# Patient Record
Sex: Female | Born: 1970 | Race: Black or African American | Hispanic: No | Marital: Single | Smoking: Former smoker
Health system: Southern US, Community
[De-identification: ages and names within clinical notes are randomized; demographics above are authoritative.]

## PROBLEM LIST (undated history)

## (undated) DIAGNOSIS — N12 Tubulo-interstitial nephritis, not specified as acute or chronic: Secondary | ICD-10-CM

## (undated) DIAGNOSIS — C801 Malignant (primary) neoplasm, unspecified: Secondary | ICD-10-CM

## (undated) DIAGNOSIS — D649 Anemia, unspecified: Secondary | ICD-10-CM

## (undated) DIAGNOSIS — J189 Pneumonia, unspecified organism: Secondary | ICD-10-CM

## (undated) DIAGNOSIS — I10 Essential (primary) hypertension: Secondary | ICD-10-CM

## (undated) DIAGNOSIS — J40 Bronchitis, not specified as acute or chronic: Secondary | ICD-10-CM

## (undated) DIAGNOSIS — R011 Cardiac murmur, unspecified: Secondary | ICD-10-CM

## (undated) DIAGNOSIS — J45909 Unspecified asthma, uncomplicated: Secondary | ICD-10-CM

## (undated) DIAGNOSIS — F32A Depression, unspecified: Secondary | ICD-10-CM

## (undated) DIAGNOSIS — Z5189 Encounter for other specified aftercare: Secondary | ICD-10-CM

## (undated) DIAGNOSIS — R87629 Unspecified abnormal cytological findings in specimens from vagina: Secondary | ICD-10-CM

## (undated) DIAGNOSIS — F419 Anxiety disorder, unspecified: Secondary | ICD-10-CM

## (undated) DIAGNOSIS — I219 Acute myocardial infarction, unspecified: Secondary | ICD-10-CM

## (undated) DIAGNOSIS — R0600 Dyspnea, unspecified: Secondary | ICD-10-CM

## (undated) DIAGNOSIS — Z9189 Other specified personal risk factors, not elsewhere classified: Secondary | ICD-10-CM

## (undated) DIAGNOSIS — T7840XA Allergy, unspecified, initial encounter: Secondary | ICD-10-CM

## (undated) DIAGNOSIS — D259 Leiomyoma of uterus, unspecified: Secondary | ICD-10-CM

## (undated) HISTORY — DX: Tubulo-interstitial nephritis, not specified as acute or chronic: N12

## (undated) HISTORY — PX: TUBAL LIGATION: SHX77

## (undated) HISTORY — DX: Dyspnea, unspecified: R06.00

## (undated) HISTORY — DX: Depression, unspecified: F32.A

## (undated) HISTORY — PX: TONSILLECTOMY: SUR1361

## (undated) HISTORY — DX: Pneumonia, unspecified organism: J18.9

## (undated) HISTORY — DX: Encounter for other specified aftercare: Z51.89

## (undated) HISTORY — DX: Anxiety disorder, unspecified: F41.9

## (undated) HISTORY — DX: Allergy, unspecified, initial encounter: T78.40XA

## (undated) HISTORY — PX: EYE SURGERY: SHX253

## (undated) HISTORY — DX: Unspecified abnormal cytological findings in specimens from vagina: R87.629

## (undated) HISTORY — DX: Unspecified asthma, uncomplicated: J45.909

## (undated) HISTORY — DX: Essential (primary) hypertension: I10

## (undated) HISTORY — DX: Cardiac murmur, unspecified: R01.1

## (undated) HISTORY — PX: ABDOMINAL HYSTERECTOMY: SHX81

---
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
## 2005-02-24 ENCOUNTER — Emergency Department (HOSPITAL_COMMUNITY): Admission: EM | Admit: 2005-02-24 | Discharge: 2005-02-25 | Payer: Self-pay | Admitting: Emergency Medicine

## 2005-04-20 ENCOUNTER — Emergency Department (HOSPITAL_COMMUNITY): Admission: EM | Admit: 2005-04-20 | Discharge: 2005-04-20 | Payer: Self-pay | Admitting: Emergency Medicine

## 2005-09-20 ENCOUNTER — Emergency Department (HOSPITAL_COMMUNITY): Admission: EM | Admit: 2005-09-20 | Discharge: 2005-09-20 | Payer: Self-pay | Admitting: Emergency Medicine

## 2005-09-27 ENCOUNTER — Emergency Department (HOSPITAL_COMMUNITY): Admission: EM | Admit: 2005-09-27 | Discharge: 2005-09-27 | Payer: Self-pay | Admitting: Emergency Medicine

## 2006-01-20 ENCOUNTER — Emergency Department (HOSPITAL_COMMUNITY): Admission: EM | Admit: 2006-01-20 | Discharge: 2006-01-21 | Payer: Self-pay | Admitting: Emergency Medicine

## 2006-07-14 ENCOUNTER — Emergency Department (HOSPITAL_COMMUNITY): Admission: EM | Admit: 2006-07-14 | Discharge: 2006-07-14 | Payer: Self-pay | Admitting: Emergency Medicine

## 2006-07-15 ENCOUNTER — Emergency Department (HOSPITAL_COMMUNITY): Admission: EM | Admit: 2006-07-15 | Discharge: 2006-07-15 | Payer: Self-pay | Admitting: Emergency Medicine

## 2006-07-17 ENCOUNTER — Inpatient Hospital Stay (HOSPITAL_COMMUNITY): Admission: EM | Admit: 2006-07-17 | Discharge: 2006-07-20 | Payer: Self-pay | Admitting: Emergency Medicine

## 2006-11-21 ENCOUNTER — Emergency Department (HOSPITAL_COMMUNITY): Admission: EM | Admit: 2006-11-21 | Discharge: 2006-11-22 | Payer: Self-pay | Admitting: Emergency Medicine

## 2006-12-24 ENCOUNTER — Emergency Department (HOSPITAL_COMMUNITY): Admission: EM | Admit: 2006-12-24 | Discharge: 2006-12-24 | Payer: Self-pay | Admitting: Emergency Medicine

## 2007-07-06 ENCOUNTER — Inpatient Hospital Stay (HOSPITAL_COMMUNITY): Admission: EM | Admit: 2007-07-06 | Discharge: 2007-07-09 | Payer: Self-pay | Admitting: Emergency Medicine

## 2007-07-06 ENCOUNTER — Ambulatory Visit: Payer: Self-pay | Admitting: Hematology & Oncology

## 2007-07-06 ENCOUNTER — Ambulatory Visit: Payer: Self-pay | Admitting: Cardiology

## 2007-07-18 ENCOUNTER — Emergency Department (HOSPITAL_COMMUNITY): Admission: EM | Admit: 2007-07-18 | Discharge: 2007-07-18 | Payer: Self-pay | Admitting: Emergency Medicine

## 2007-09-22 IMAGING — CR DG CHEST 2V
2 series · 2 of 2 positions shown · non-contrast
Comparison: None.

CLINICAL DATA: Productive cough for approximately 1 week.  
 CHEST - 2 VIEW ? 04/20/05:

[w chest pa (1 of 2)]
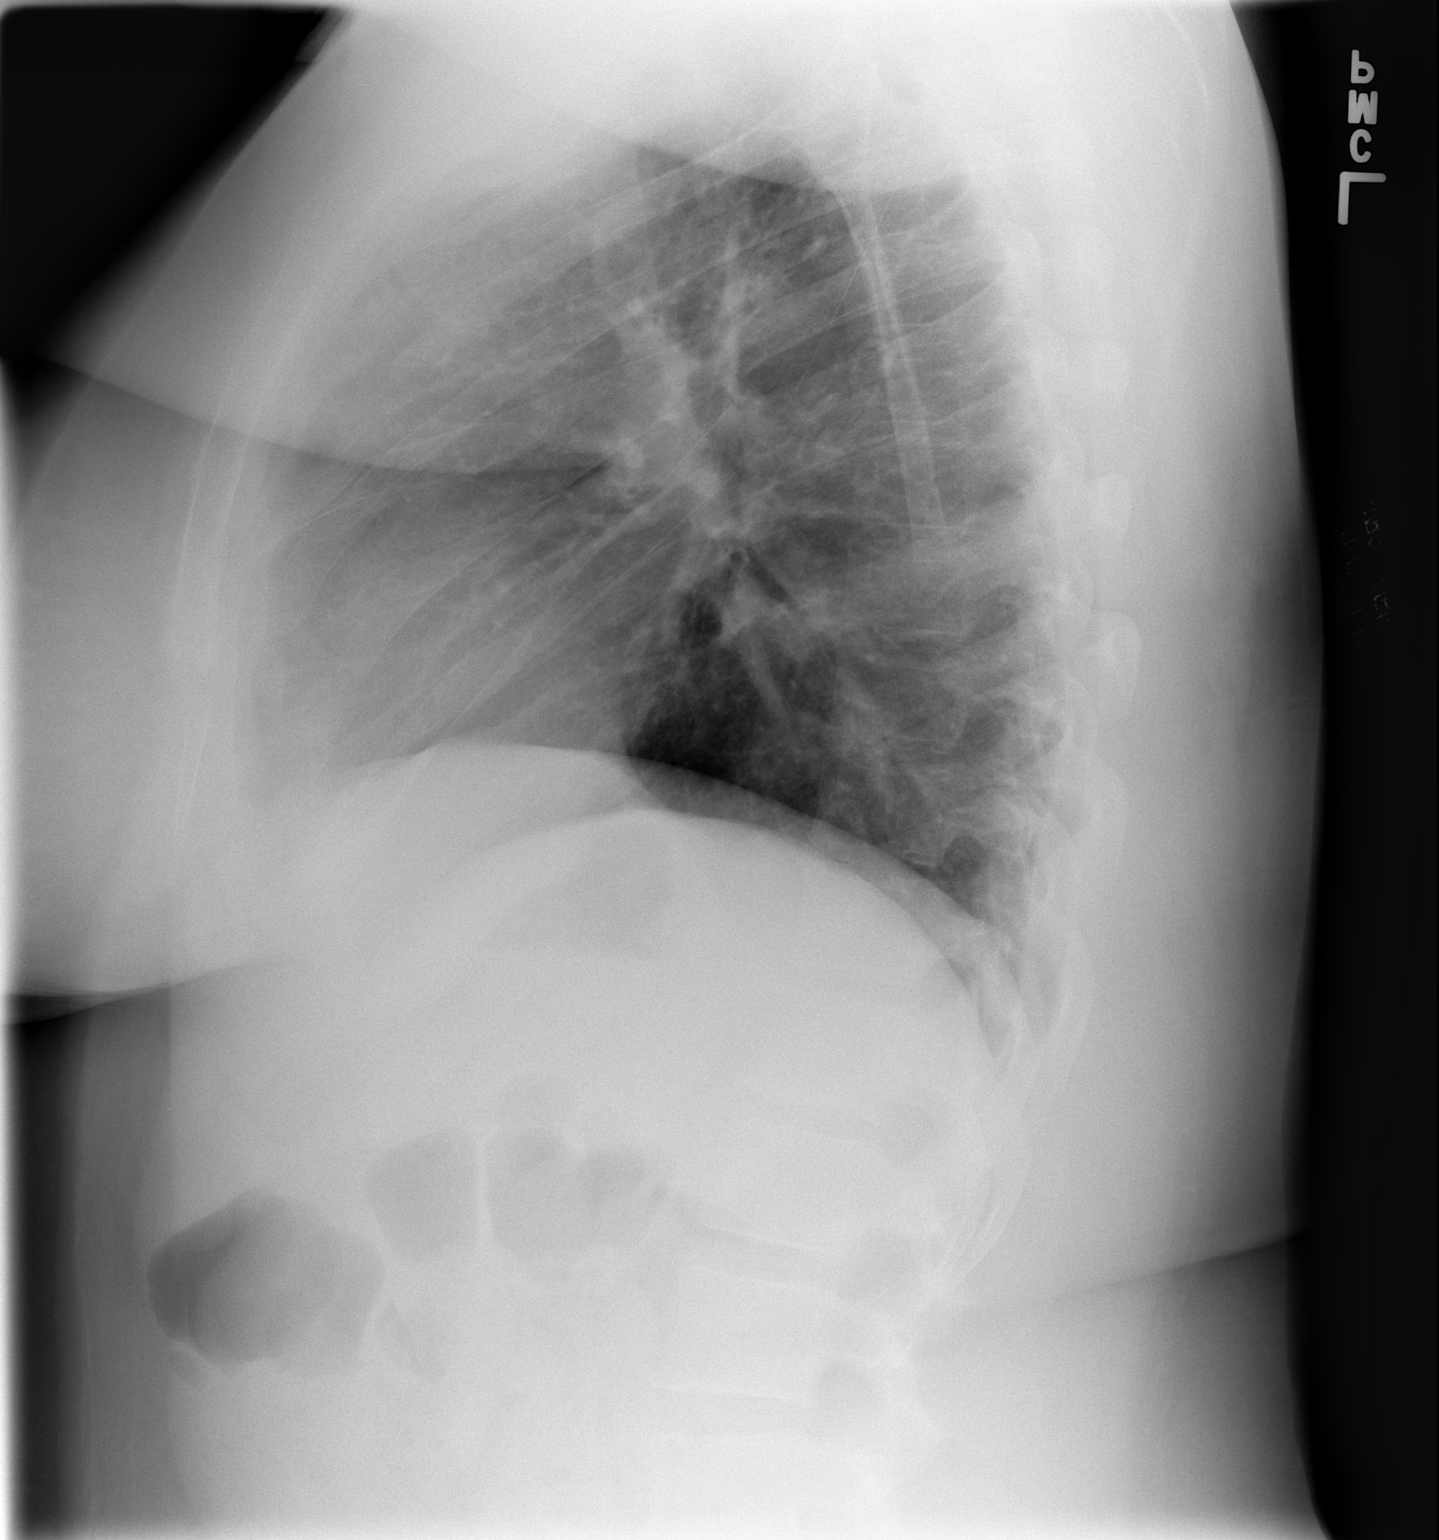

[w chest pa (2 of 2)]
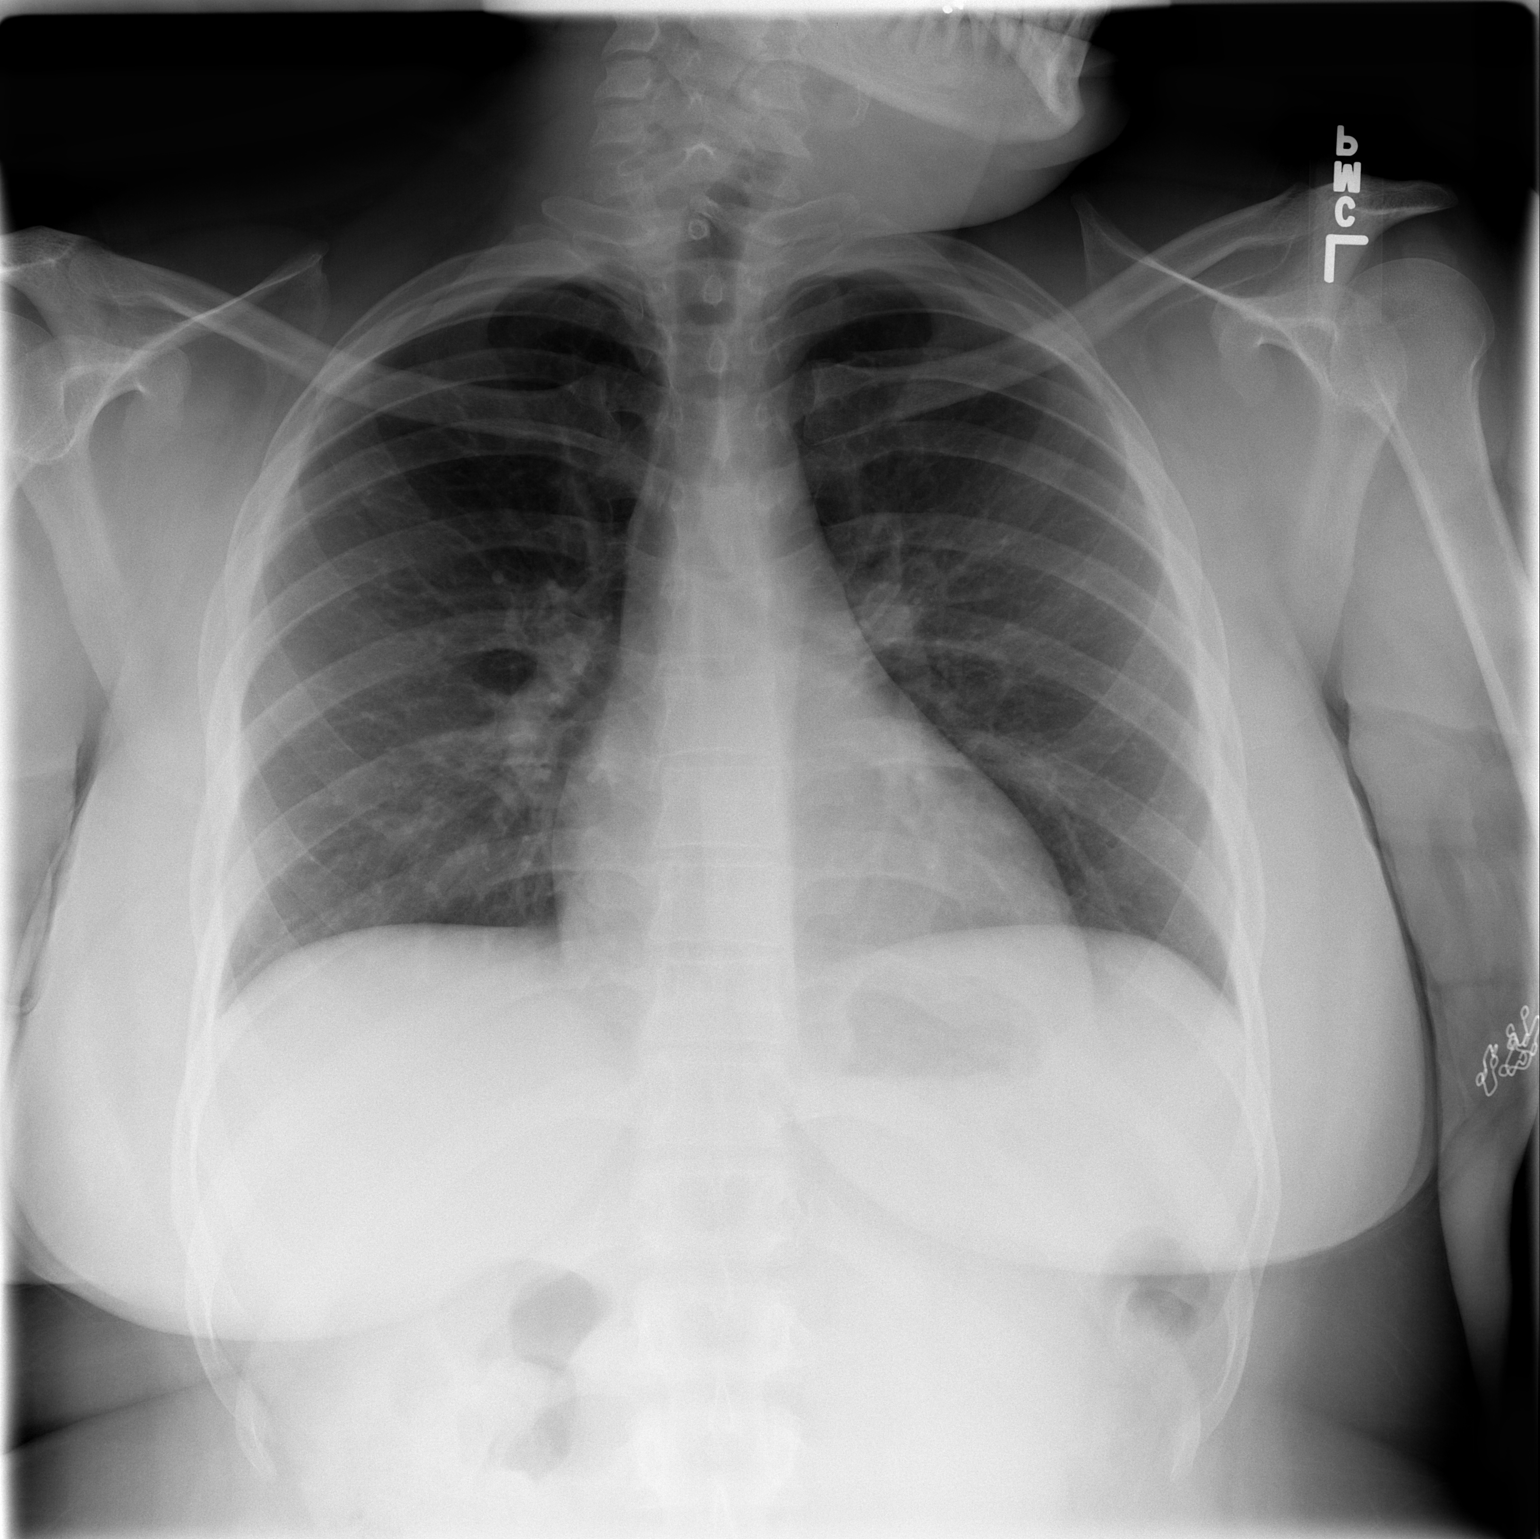

[2 of 2 positions shown; findings below may reference images not displayed]

FINDINGS: Lung volumes are low but the lungs are clear.  No pleural effusion.  The heart size is normal.  No focal bony abnormality.
IMPRESSION: No acute disease.

## 2008-02-01 ENCOUNTER — Emergency Department (HOSPITAL_COMMUNITY): Admission: EM | Admit: 2008-02-01 | Discharge: 2008-02-02 | Payer: Self-pay | Admitting: Emergency Medicine

## 2008-02-29 IMAGING — CT CT ABDOMEN W/ CM
1 of 5 series · 14 of 36 positions shown, 19 images · IV contrast (omnipaque)
Comparison: none

CLINICAL DATA: Lower abdominal pain. 
ABDOMEN CT WITH CONTRAST ? 09/27/05:
TECHNIQUE: Multidetector CT imaging of the abdomen was performed following the standard protocol during bolus administration of intravenous contrast. 
Contrast:  120 cc Omnipaque 300 IV.   Oral contrast was given.
TECHNIQUE: Multidetector CT imaging of the pelvis was performed following the standard protocol during bolus administration of intravenous contrast.

[Series 2: routine abdomen · axial · 0.82mm/px · z∈[-412,-67]mm · 14 of 79 slices shown, 19 images]
[im 5/79  soft-tissue]
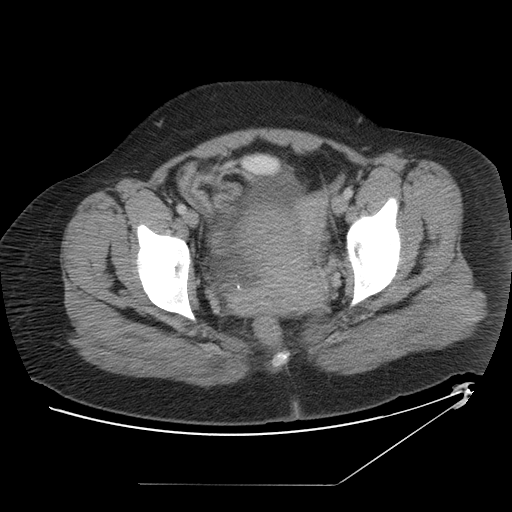
[im 5/79  bone]
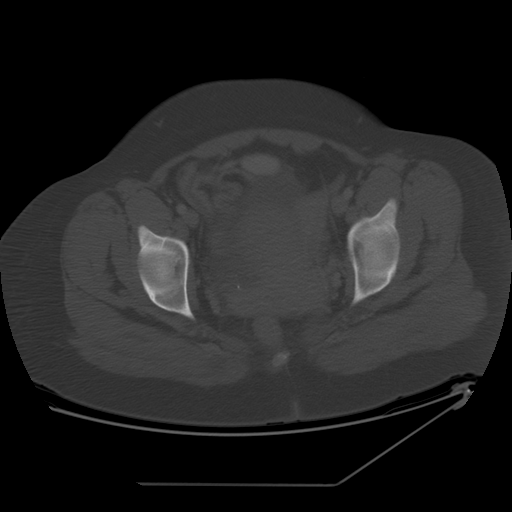
[im 13/79  soft-tissue]
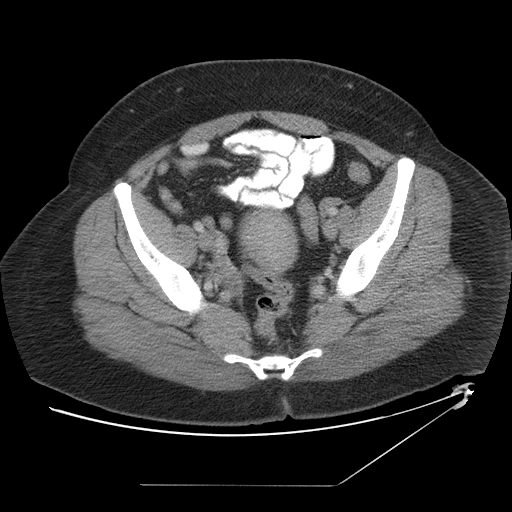
[im 17/79  soft-tissue]
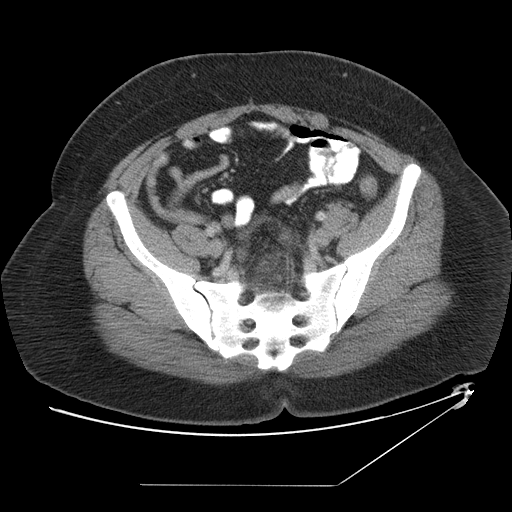
[im 21/79  soft-tissue]
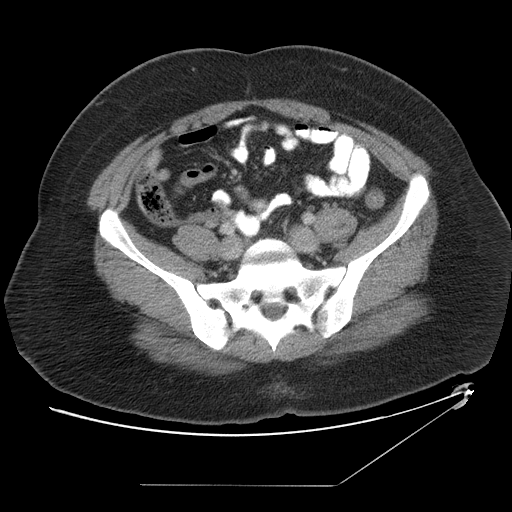
[im 29/79  soft-tissue]
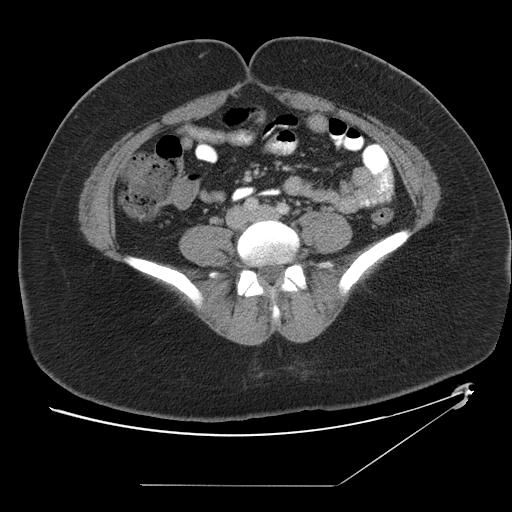
[im 33/79  soft-tissue]
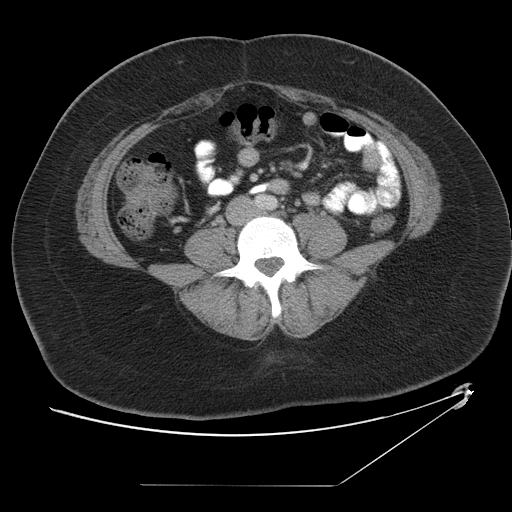
[im 42/79  soft-tissue]
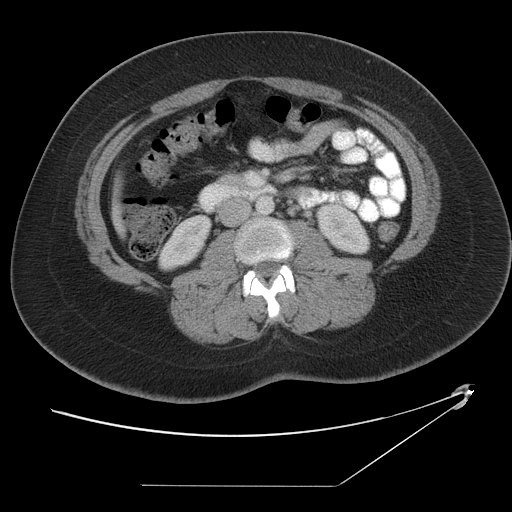
[im 46/79  soft-tissue]
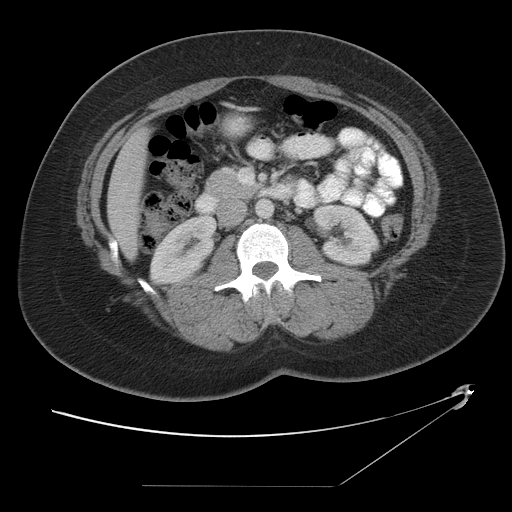
[im 50/79  soft-tissue]
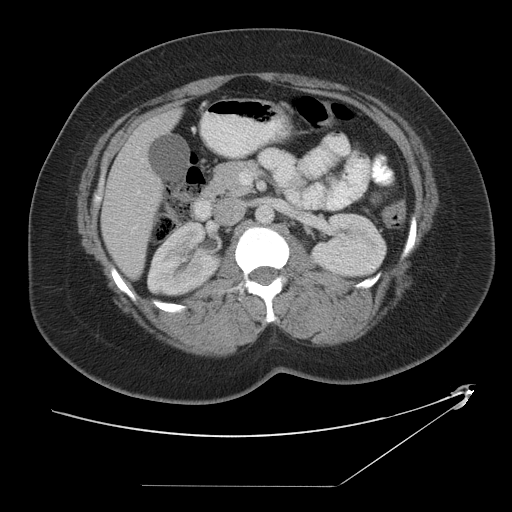
[im 50/79  bone]
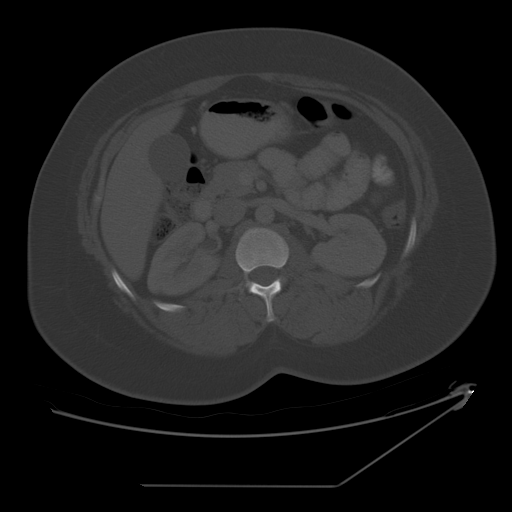
[im 58/79  soft-tissue]
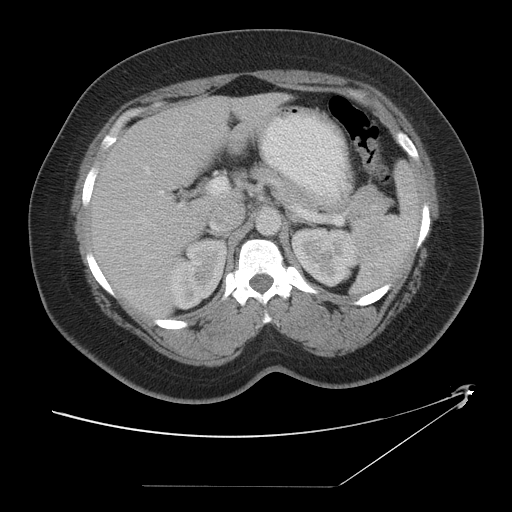
[im 62/79  soft-tissue]
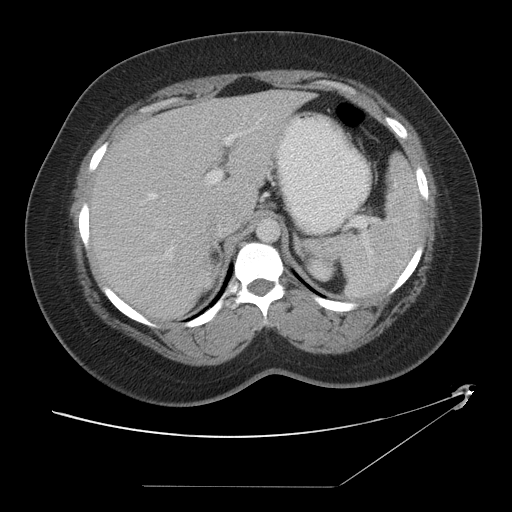
[im 62/79  lung]
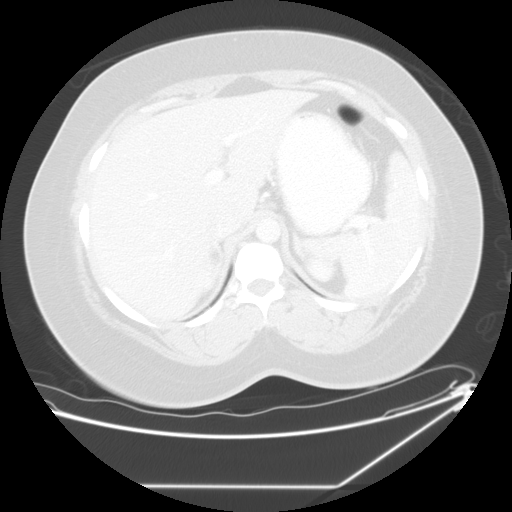
[im 66/79  soft-tissue]
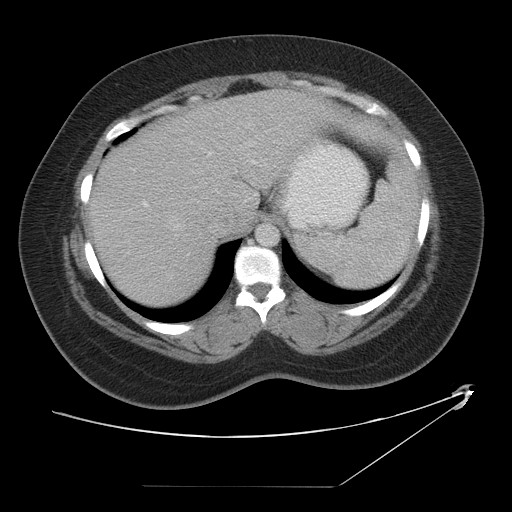
[im 66/79  lung]
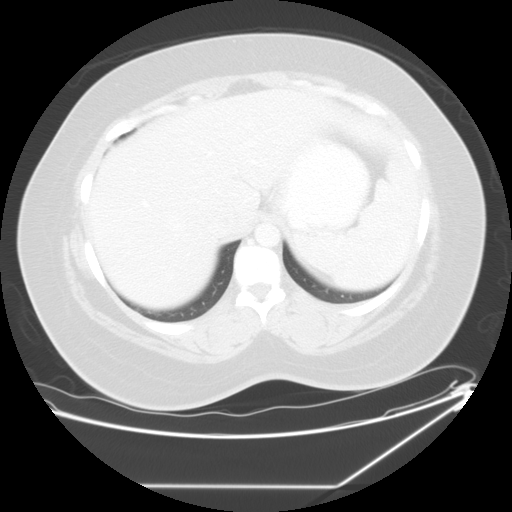
[im 70/79  lung]
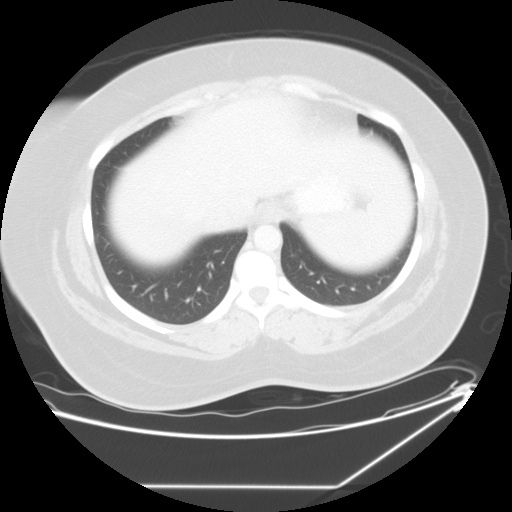
[im 74/79  soft-tissue]
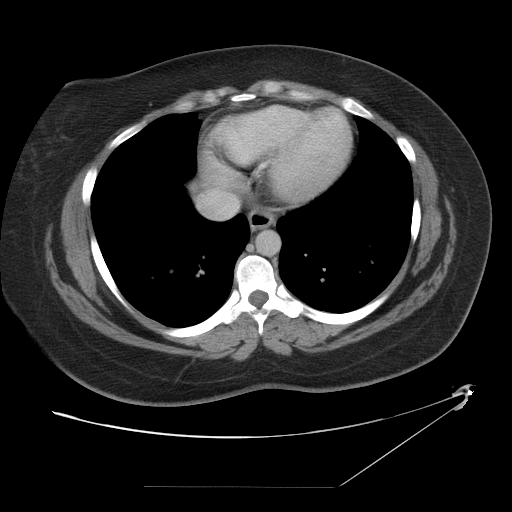
[im 74/79  lung]
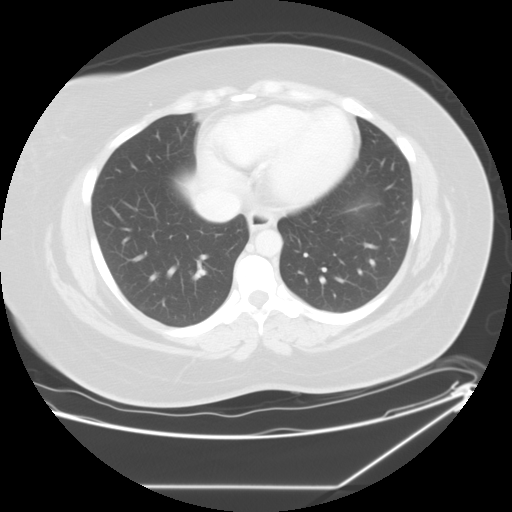

[14 of 36 positions shown; findings below may reference images not displayed]

FINDINGS: The lung bases are clear.
The abdominal parenchymal organs are unremarkable.  There is no evidence of mass or adenopathy.  No inflammatory process or abnormal fluid collections are identified.  No other significant abnormality noted.
IMPRESSION: Negative abdomen CT.  
PELVIS CT WITH CONTRAST ? 09/27/05:
FINDINGS: The uterus and ovaries are unremarkable by CT. criteria.  Trace free fluid is present in the pelvis.  The colon and small bowel are unremarkable.  No free air.  Osseous structures are intact.
IMPRESSION: No acute intrapelvic pathology.  Trace free pelvic fluid is noted.  If ovarian/adnexal pathology is suspected, ultrasound would offer greater sensitivity for evaluation of these entities.

## 2008-06-23 IMAGING — CR DG ABDOMEN ACUTE W/ 1V CHEST
3 series · 3 of 3 positions shown · non-contrast
Comparison: none

CLINICAL DATA: Abdominal pain. 
 ACUTE ABDOMINAL SERIES WITH CHEST - 3 VIEW:

[w chest pa]
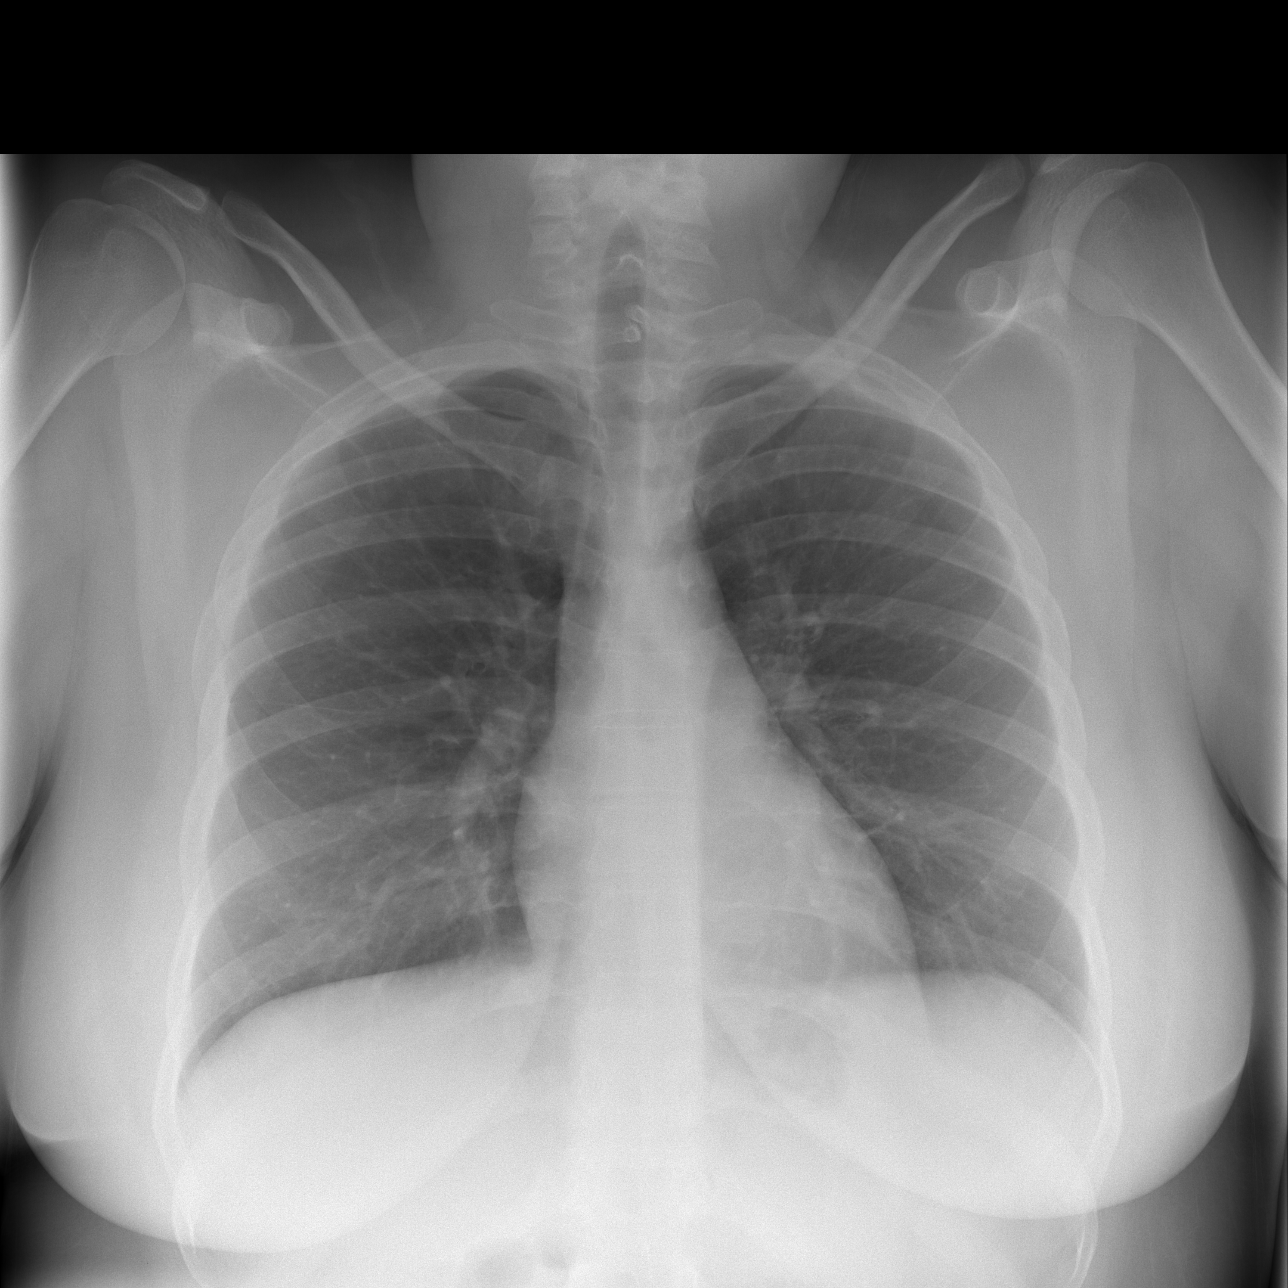

[w abdomen upright *]
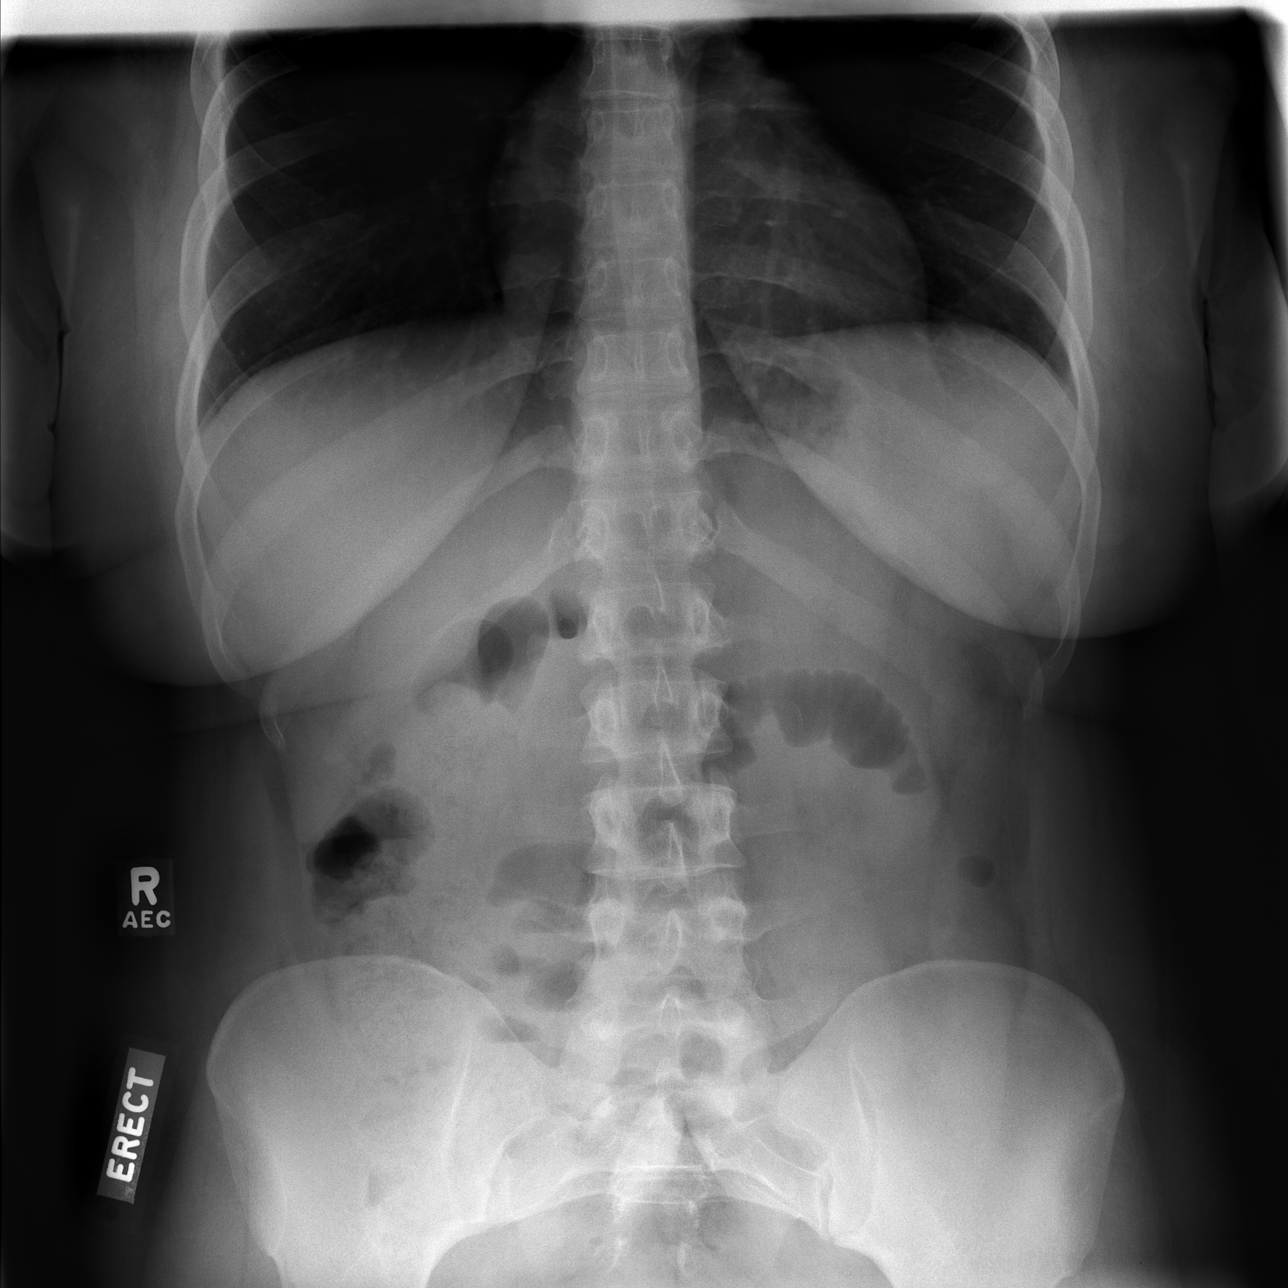

[t abdomen supine]
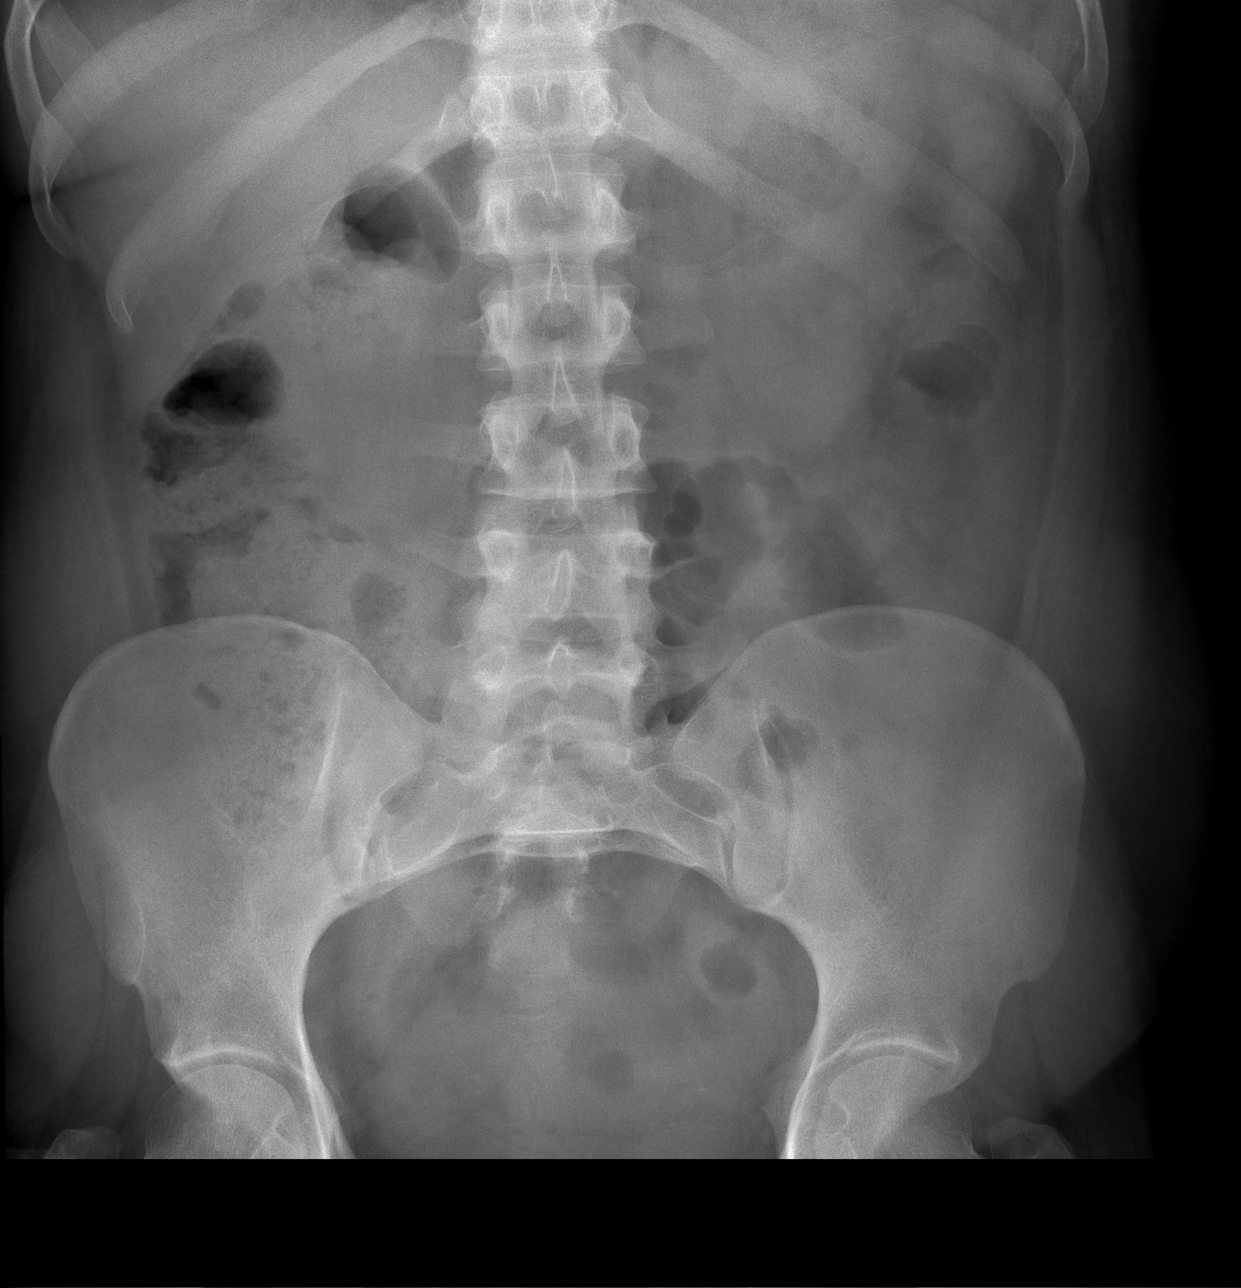

[3 of 3 positions shown; findings below may reference images not displayed]

FINDINGS: The lungs are clear.  The heart is normal in size.  
 There is no free intraperitoneal gas.  Mildly prominent small bowel loops are present in the lower abdomen with air-fluid levels.  Stool is noted in the ascending colon.  Soft tissues are within normal limits.
IMPRESSION: Mildly prominent small bowel loops with air-fluid levels compatible with early small bowel obstruction or mild ileus.

## 2008-07-09 ENCOUNTER — Emergency Department (HOSPITAL_COMMUNITY): Admission: EM | Admit: 2008-07-09 | Discharge: 2008-07-09 | Payer: Self-pay | Admitting: Emergency Medicine

## 2008-09-21 ENCOUNTER — Emergency Department (HOSPITAL_COMMUNITY): Admission: EM | Admit: 2008-09-21 | Discharge: 2008-09-22 | Payer: Self-pay | Admitting: Emergency Medicine

## 2008-12-15 IMAGING — CR DG HIP (WITH OR WITHOUT PELVIS) 2-3V*L*
3 series · 3 of 3 positions shown · non-contrast
Comparison: none

CLINICAL DATA: 35-year-old, fell.   Left hip pain.    
 LEFT HIP - 2 VIEW AND 1 VIEW AP PELVIS:

[t pelvis a.p.]
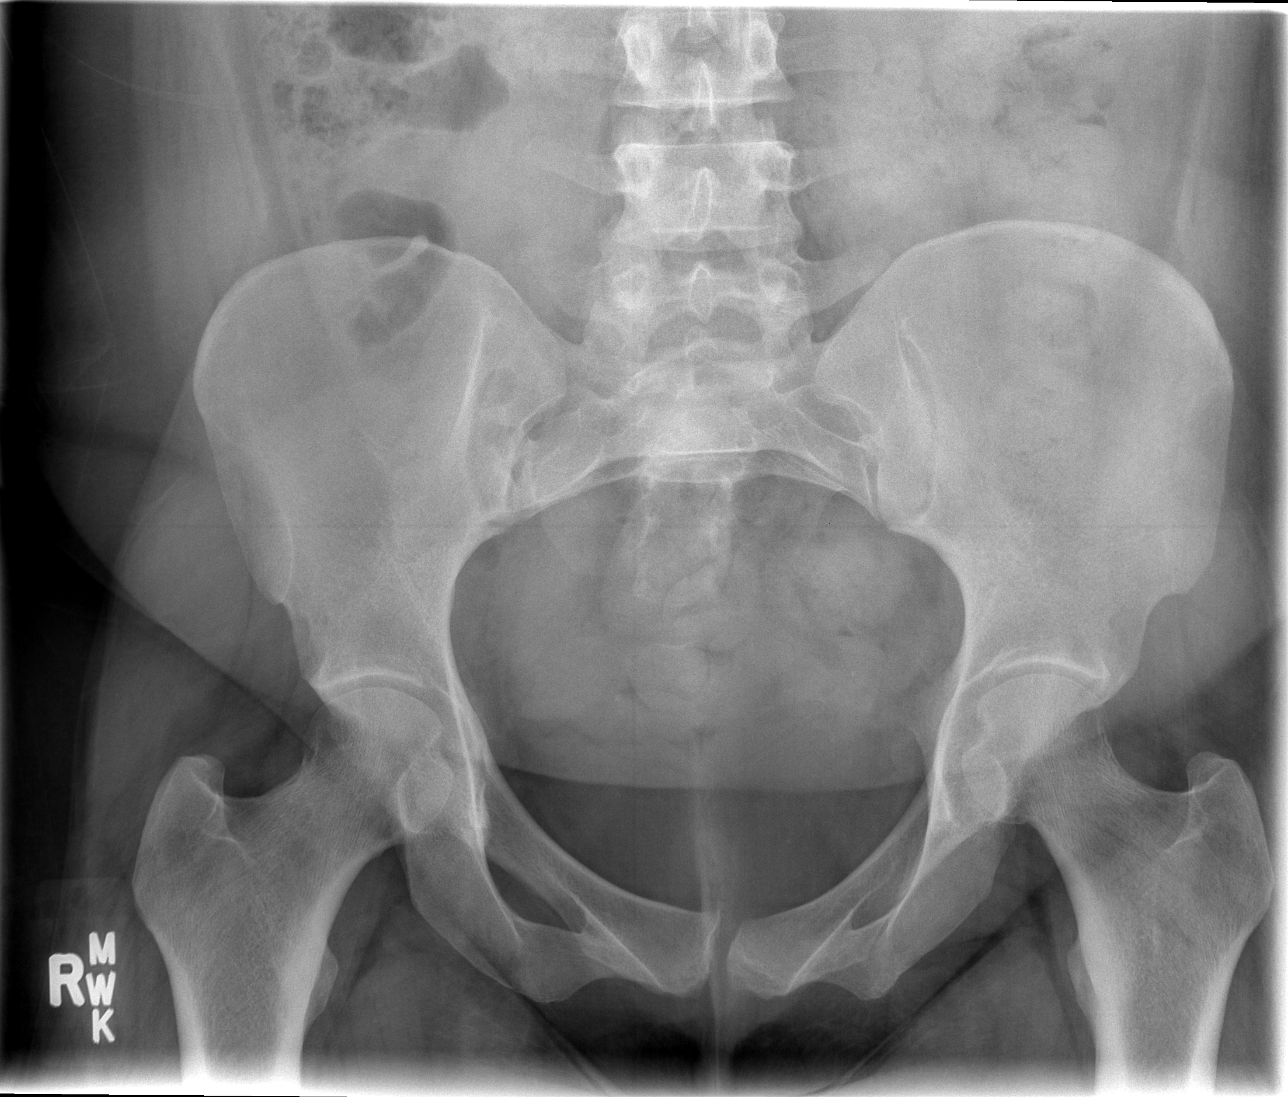

[t hip ap left]
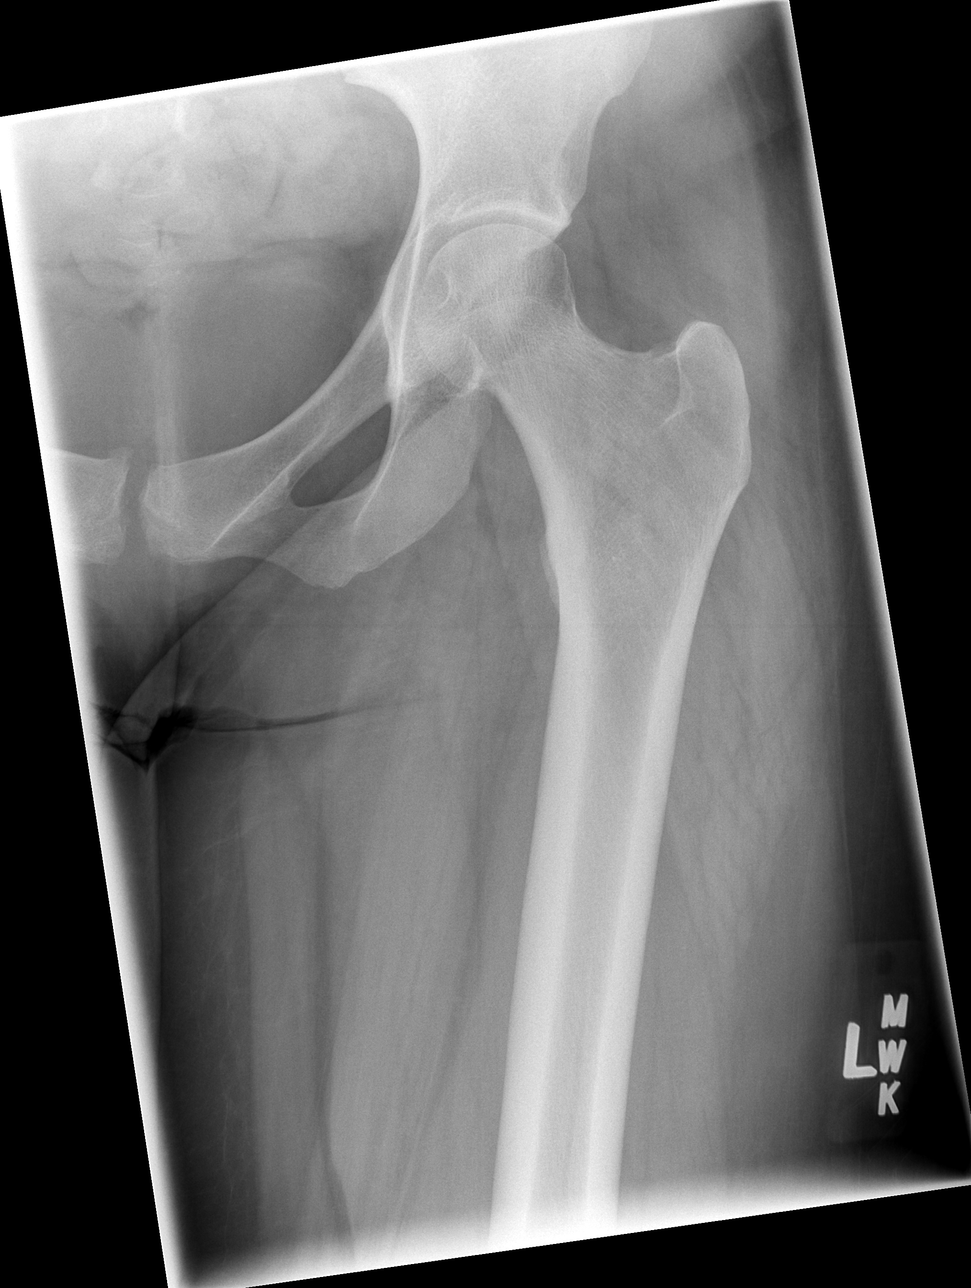

[t hip frog leg left]
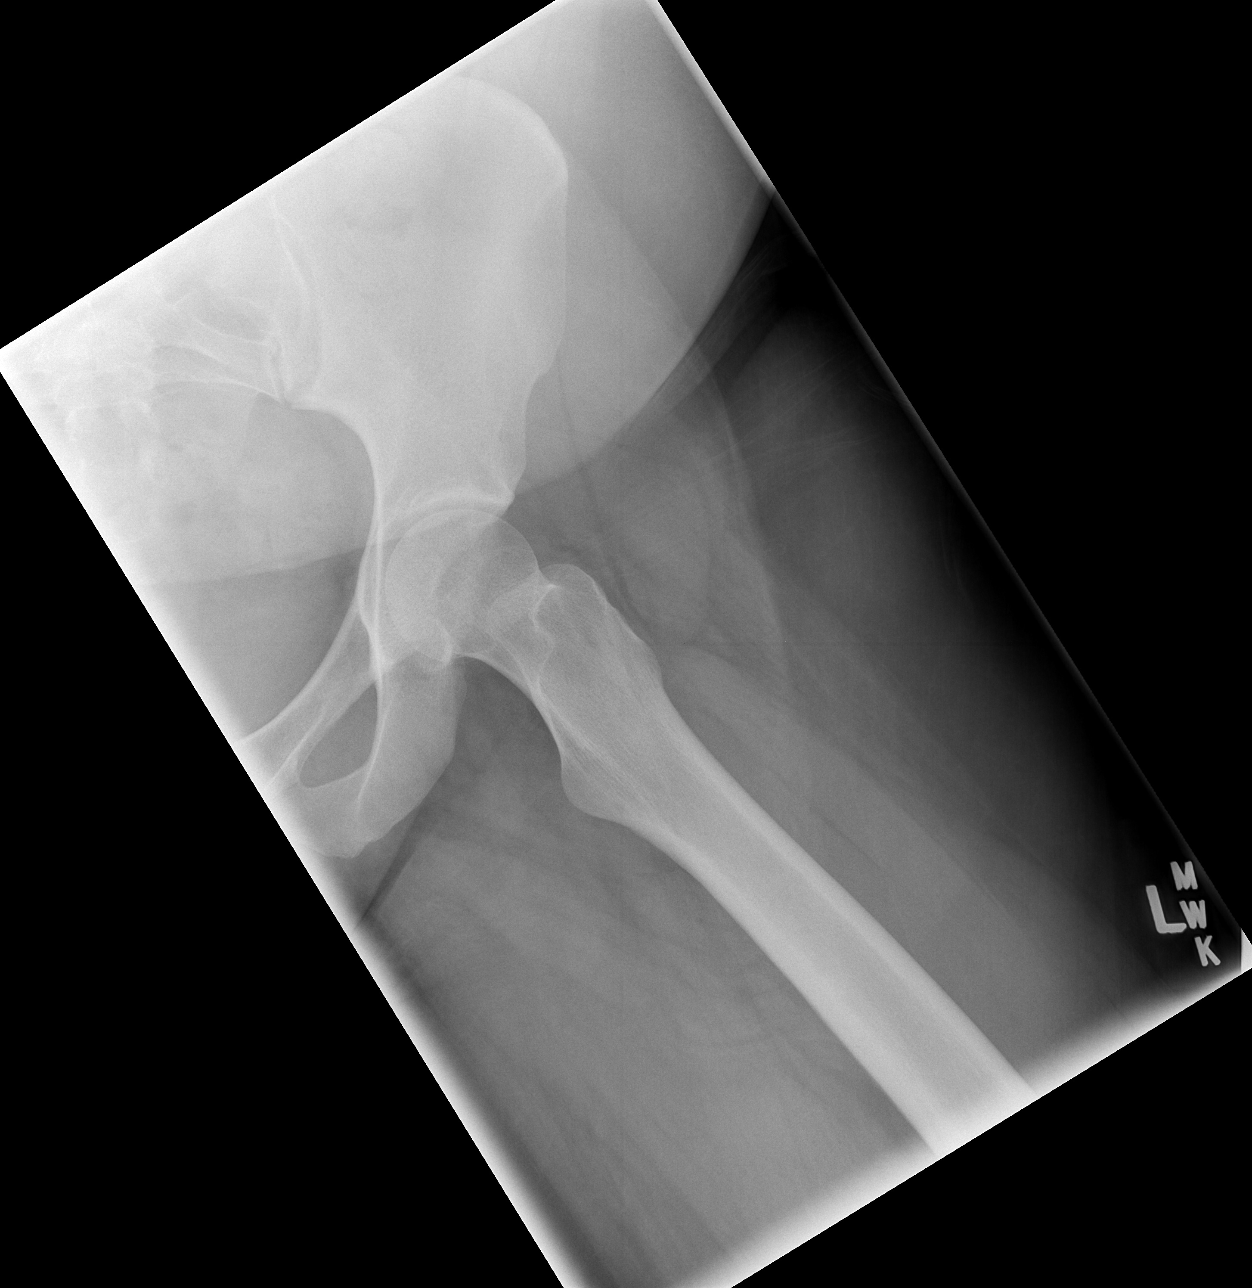

[3 of 3 positions shown; findings below may reference images not displayed]

FINDINGS: No fractures are seen.  The pubic symphysis and SI joints are intact.  Mild degenerative change at the pubic symphysis.  No plain film findings for avascular necrosis.  SI joints are intact.
IMPRESSION: 1.  No acute bony findings. 
 2.  No significant degenerative changes.  There is mild degenerative change at the pubic symphysis.

## 2008-12-16 IMAGING — CT CT HEAD W/O CM
1 of 2 series · 16 of 30 positions shown, 20 images · non-contrast
Comparison: none

CLINICAL DATA: 35 year old female; headache, nausea, vomiting.
HEAD CT WITHOUT CONTRAST ? 07/15/06:
TECHNIQUE: Contiguous axial CT images were obtained from the base of the skull through the vertex according to standard protocol without contrast.   
No comparisons.

[Series 2: headseq 4.8 h45s · axial · 0.43mm/px · z∈[-135,-11]mm · 16 of 30 slices shown, 20 images]
[im 2/30  brain]
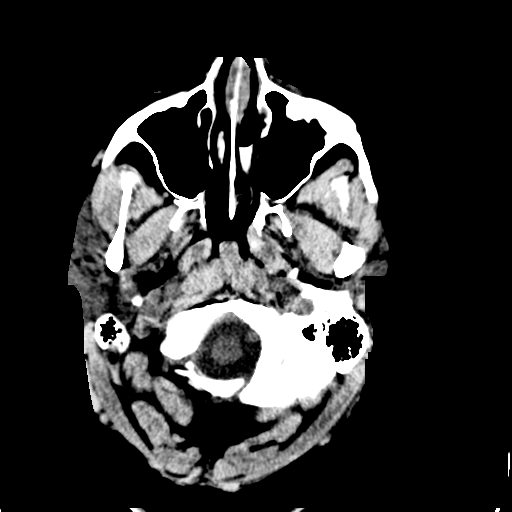
[im 2/30  bone]
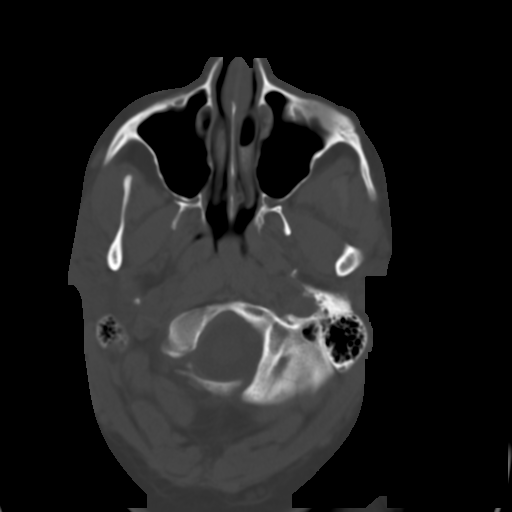
[im 4/30  brain]
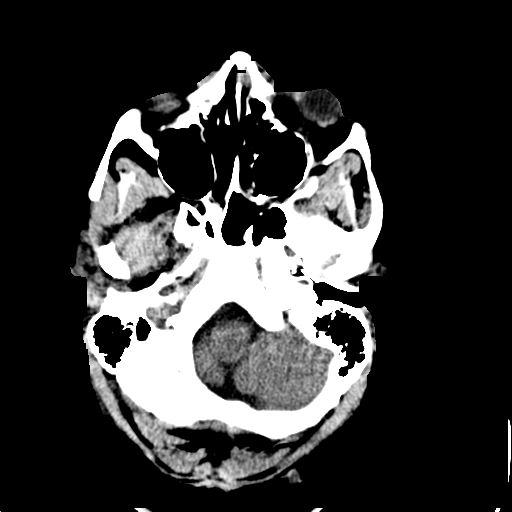
[im 5/30  brain]
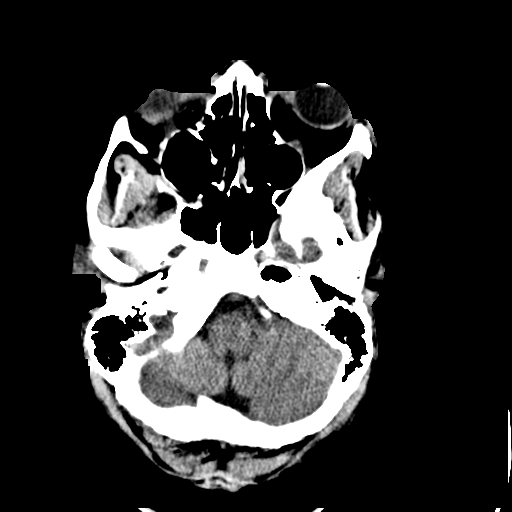
[im 8/30  brain]
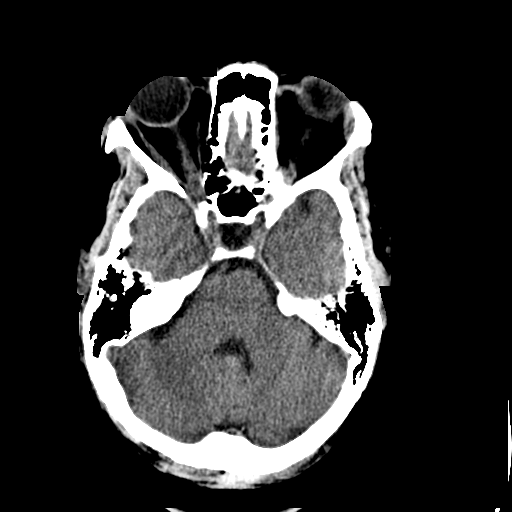
[im 9/30  brain]
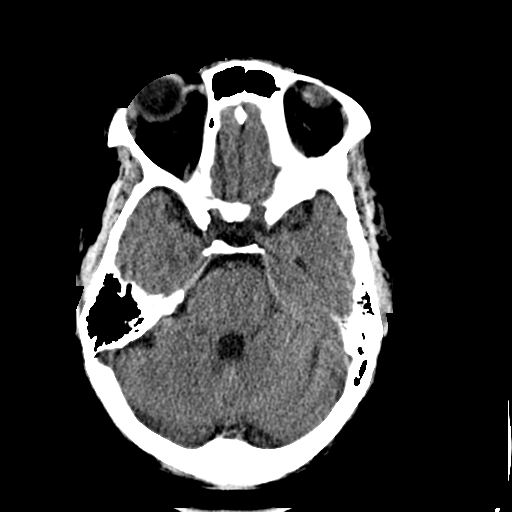
[im 9/30  bone]
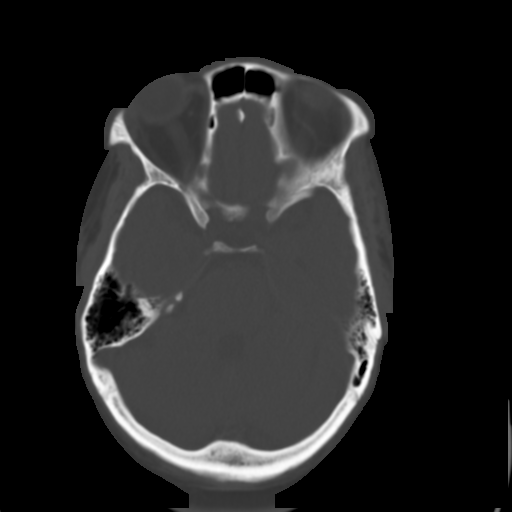
[im 10/30  brain]
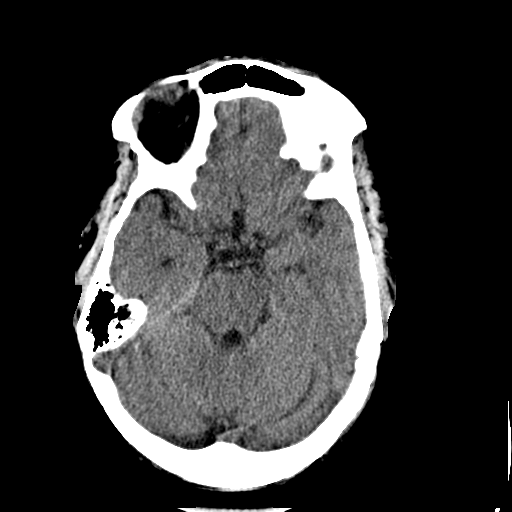
[im 13/30  brain]
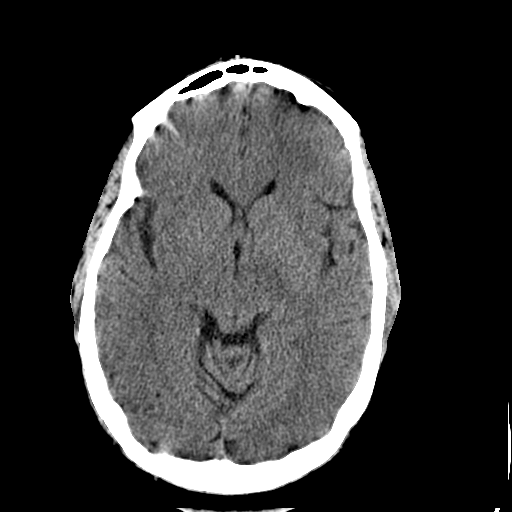
[im 14/30  brain]
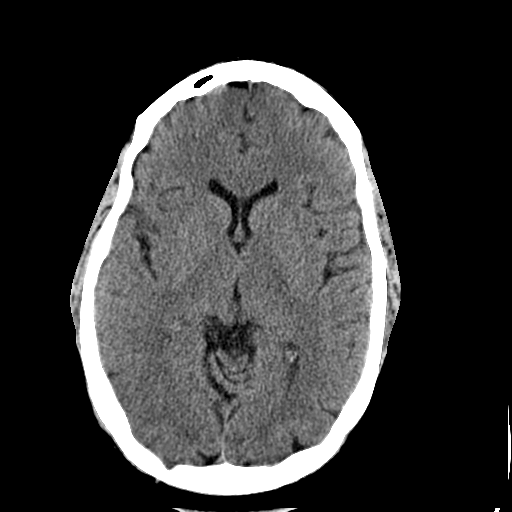
[im 16/30  brain]
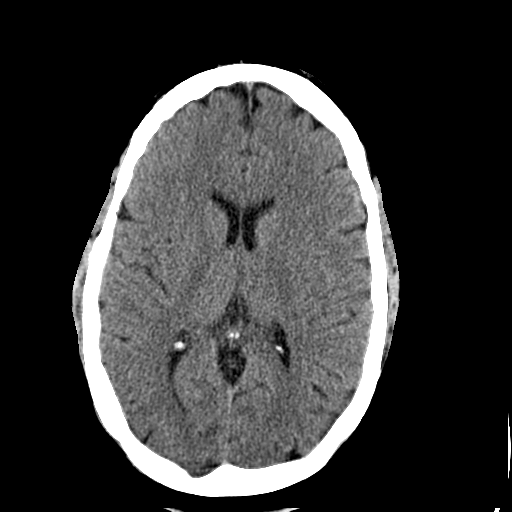
[im 16/30  bone]
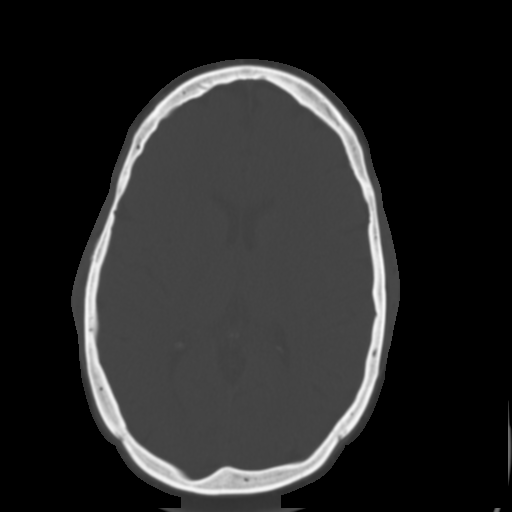
[im 17/30  brain]
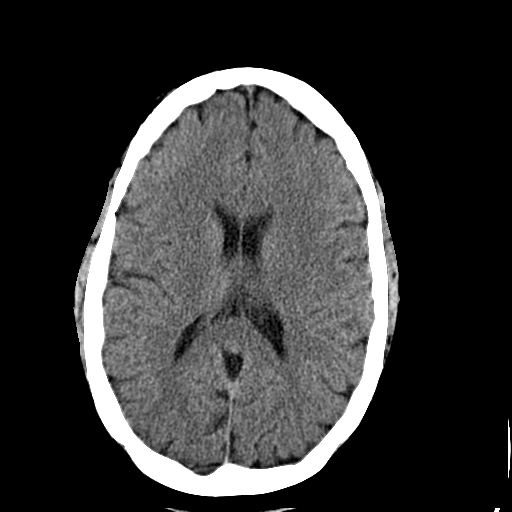
[im 20/30  brain]
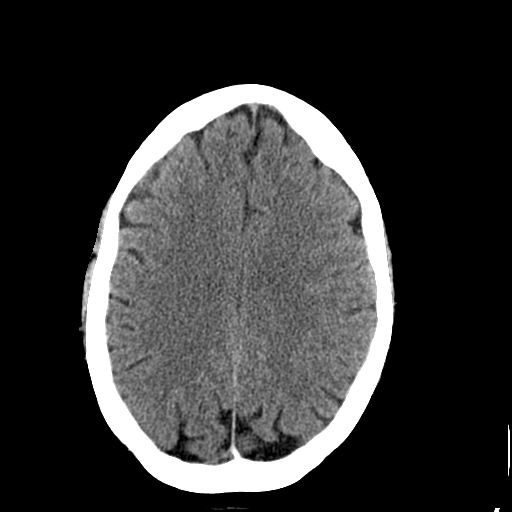
[im 21/30  brain]
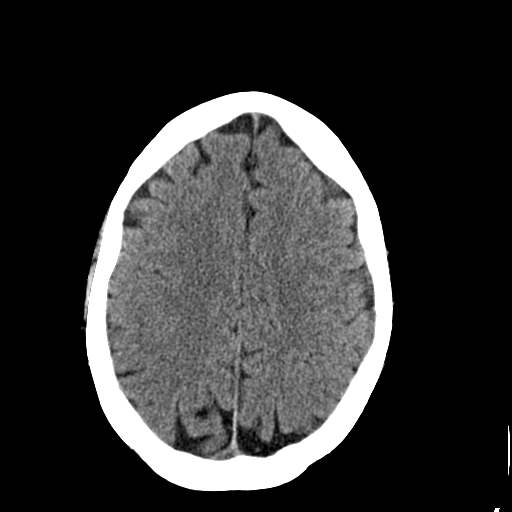
[im 22/30  brain]
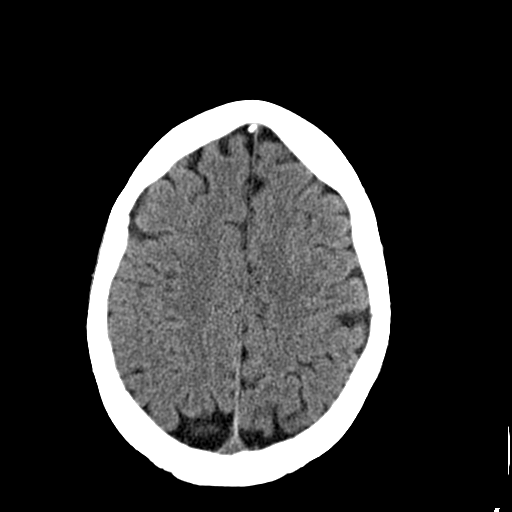
[im 22/30  bone]
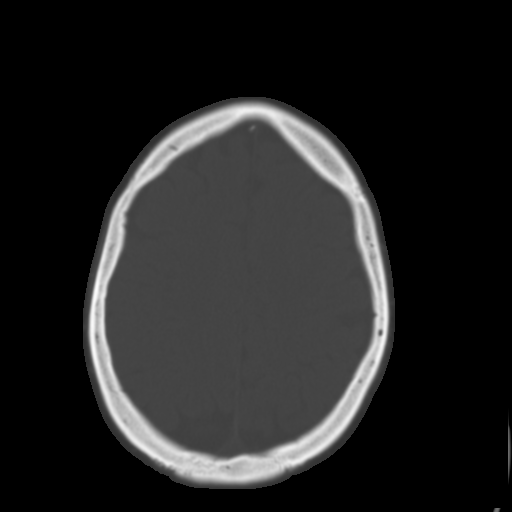
[im 25/30  brain]
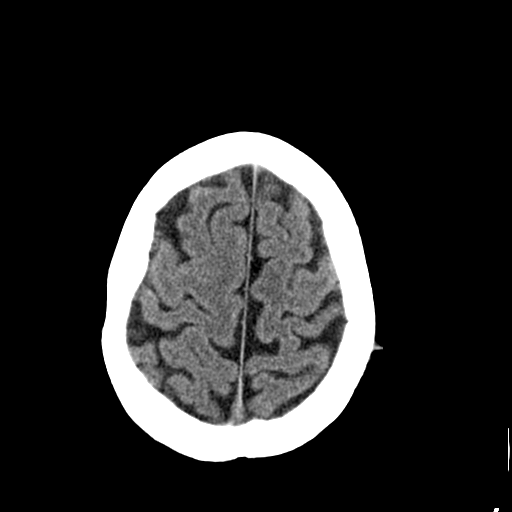
[im 26/30  brain]
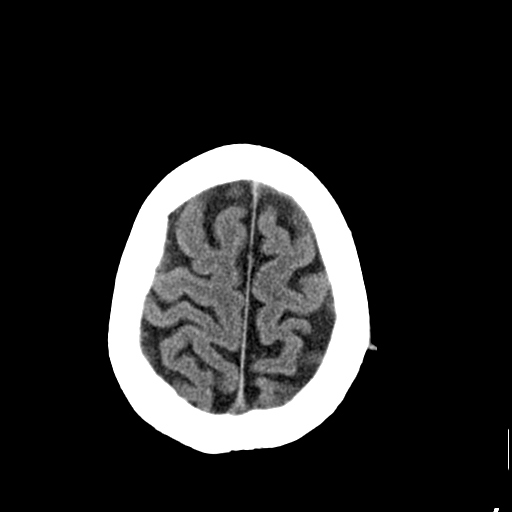
[im 28/30  brain]
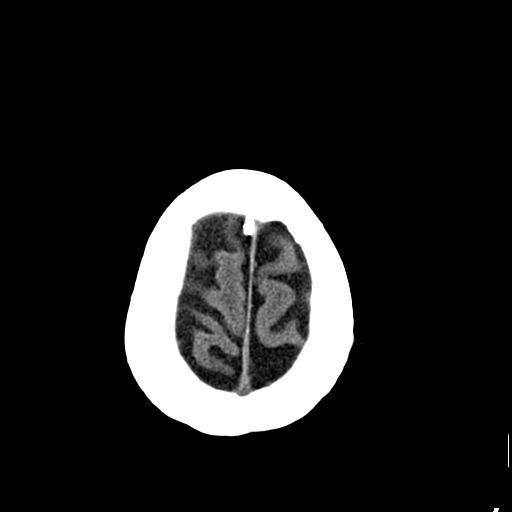

[16 of 30 positions shown; findings below may reference images not displayed]

FINDINGS: No acute intracranial infarction, hemorrhage, mass lesion, hydrocephalus, midline shift, herniation, or extra-axial fluid collection.  The gray-white differentiation is maintained.  There is prominence of the CSF spaces and  the sulcal pattern suggestive of early mild atrophy for the patient?s young age.  The cisterns are patent.  The mastoid air cells and sinuses visualized are clear.
IMPRESSION: 1.  No acute intracranial finding.
2.  Suspect early mild brain atrophy.

## 2008-12-18 IMAGING — MR MR LUMBAR SPINE WO/W CM
4 of 8 series · 20 of 48 positions shown · IV contrast (magnevist)
Comparison: none

CLINICAL DATA: Back pain.  Recent infection.
 MRI LUMBAR SPINE WITHOUT AND WITH CONTRAST:
TECHNIQUE: Multiplanar and multiecho pulse sequences of the lumbar spine, to include the lower thoracic region and upper sacral regions, were obtained according to standard protocol before and after administration of intravenous contrast.
 Contrast:  20 cc Magnevist IV.

[Series 3: T1 · sagittal · 4.0mm · 0.55mm/px · 4 of 12 slices shown (1 of 2)]
[im 1/12]
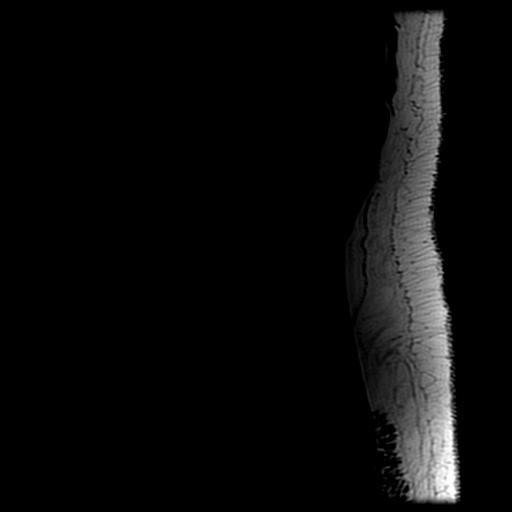
[im 3/12]
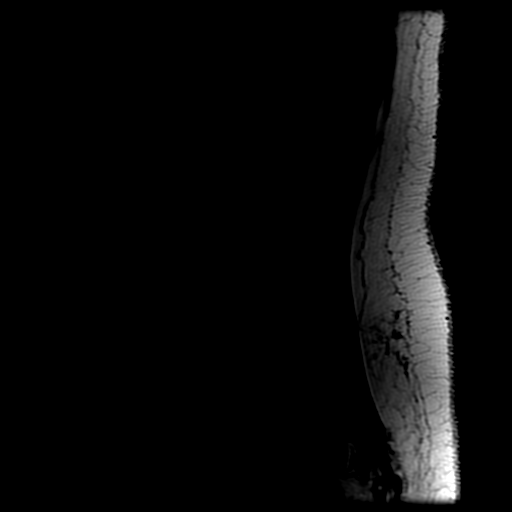
[im 6/12]
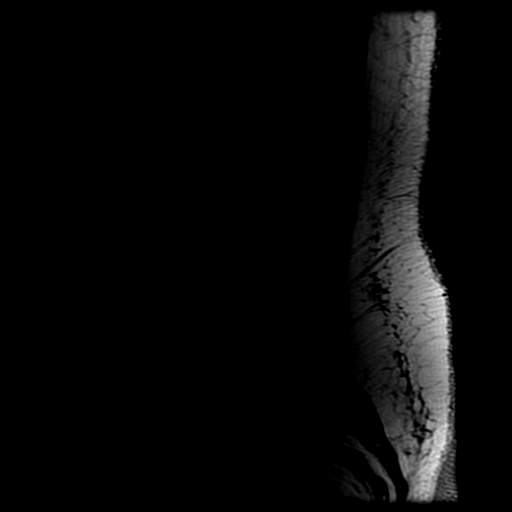
[im 12/12]
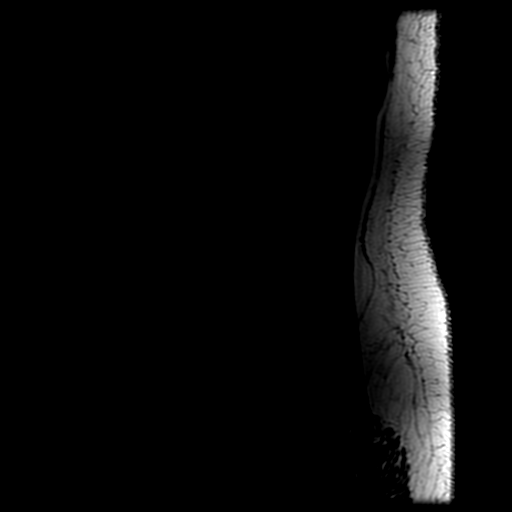

[Series 4: T2 · sagittal · 4.0mm · 0.55mm/px · 5 of 12 slices shown (1 of 2)]
[im 1/12]
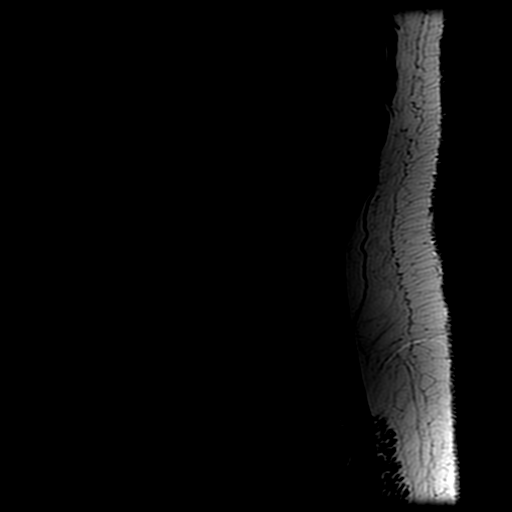
[im 3/12]
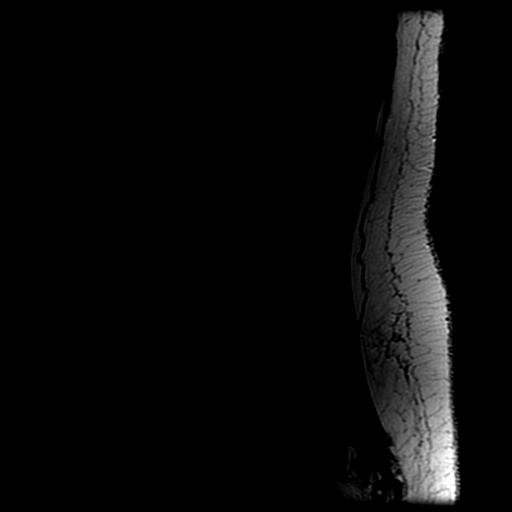
[im 6/12]
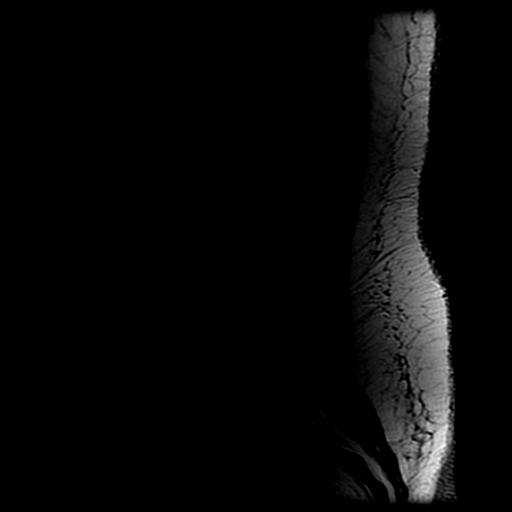
[im 9/12]
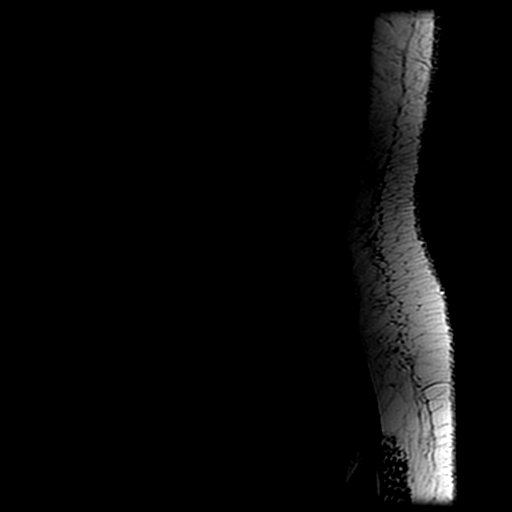
[im 12/12]
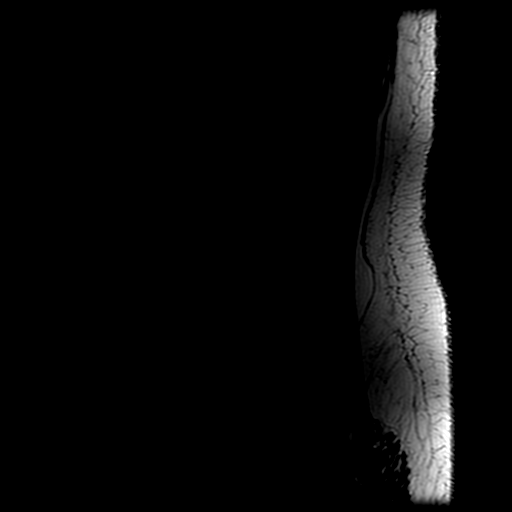

[Series 6: T2 · axial · 4.0mm · 0.35mm/px · z∈[-80,+140]mm · 8 of 22 slices shown (2 of 2)]
[im 1/22]
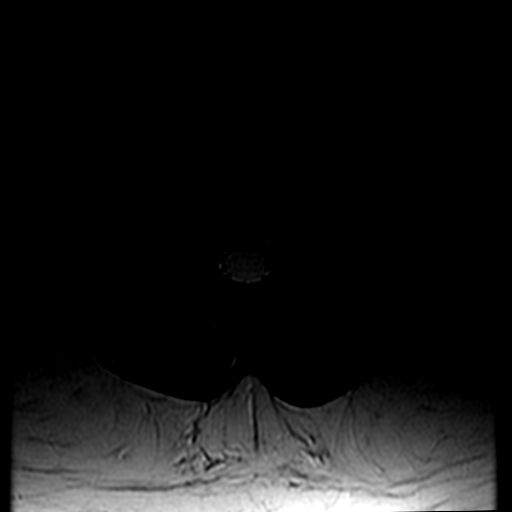
[im 4/22]
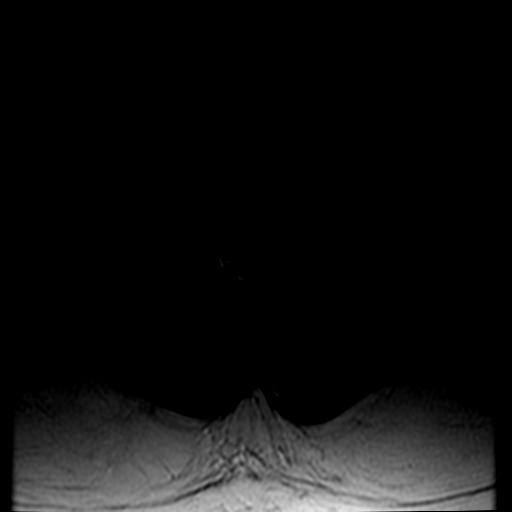
[im 7/22]
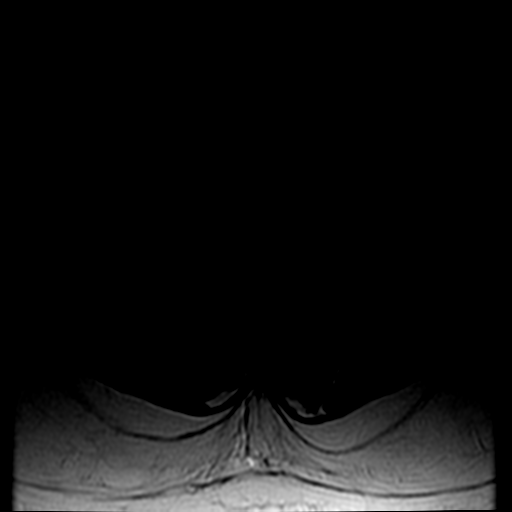
[im 10/22]
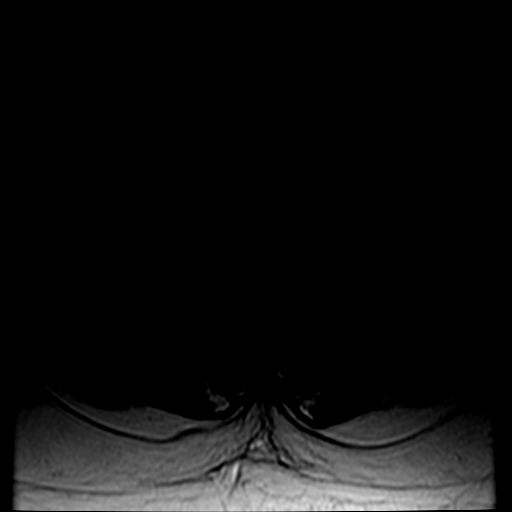
[im 13/22]
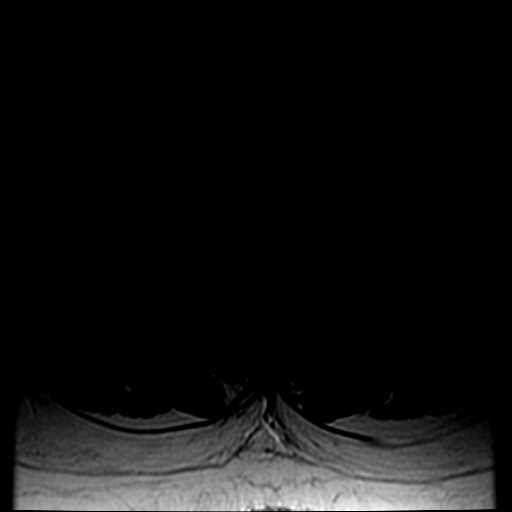
[im 16/22]
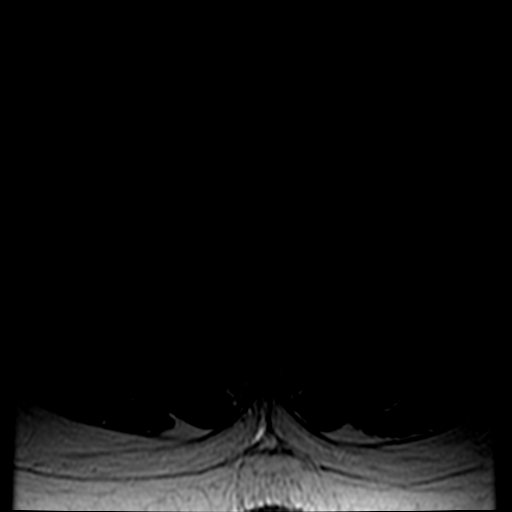
[im 19/22]
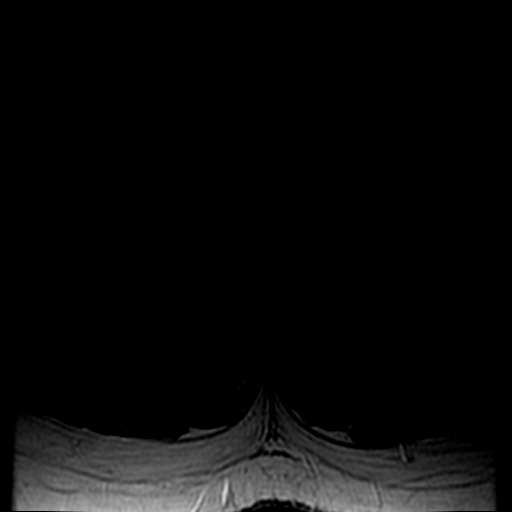
[im 22/22]
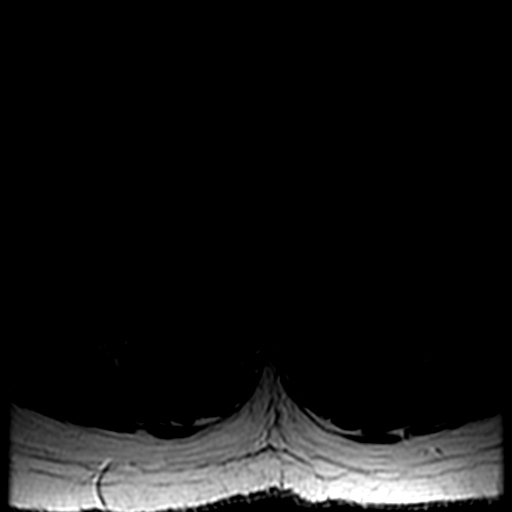

[Series 7: T1 · axial · 4.0mm · 0.35mm/px · z∈[-67,+112]mm · 3 of 22 slices shown (2 of 2)]
[im 4/22]
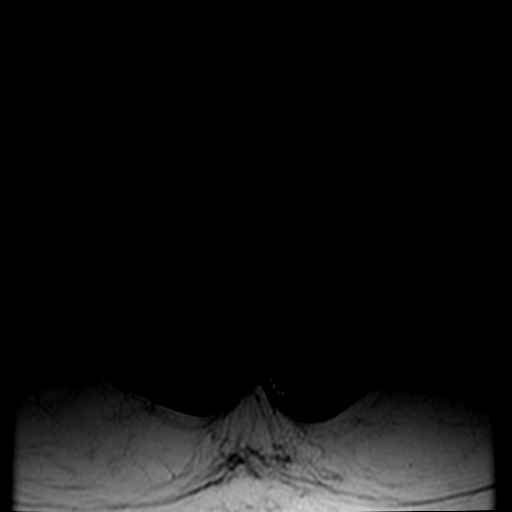
[im 13/22]
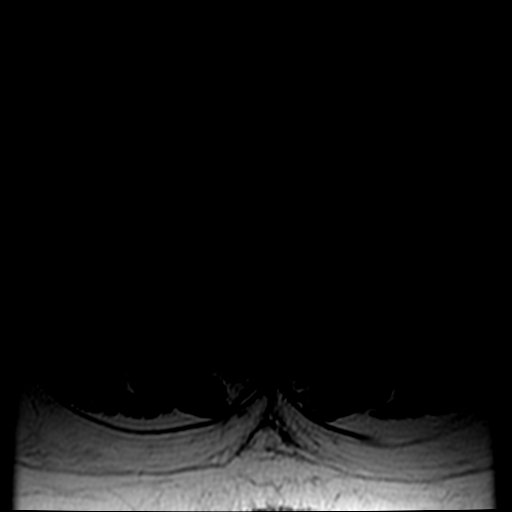
[im 19/22]
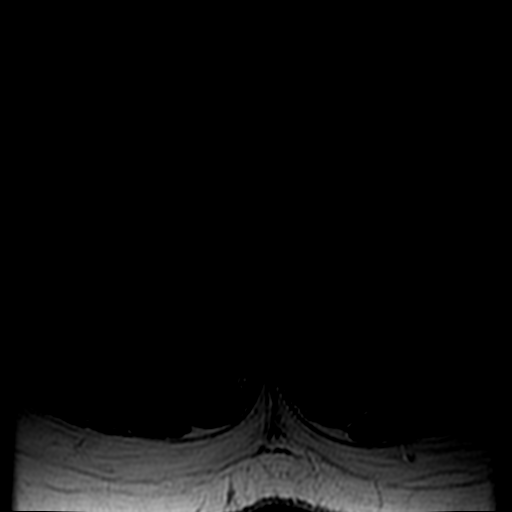

[20 of 48 positions shown; findings below may reference images not displayed]

FINDINGS: Alignment of the spine is normal.  The osseous and articular structures are normal.  The disks are normal. The canal and foramina are widely patent. The distal cord and conus are normal. There is no abnormal enhancement.  No paravertebral lesion is seen.
IMPRESSION: Normal examination.

## 2008-12-18 IMAGING — CR DG CHEST 2V
2 series · 2 of 2 positions shown · non-contrast
Comparison: 04/20/05.

CLINICAL DATA: Neck pain.  Chest pain radiating down the back to the leg.  
 CHEST - 2 VIEW:

[w chest pa *]
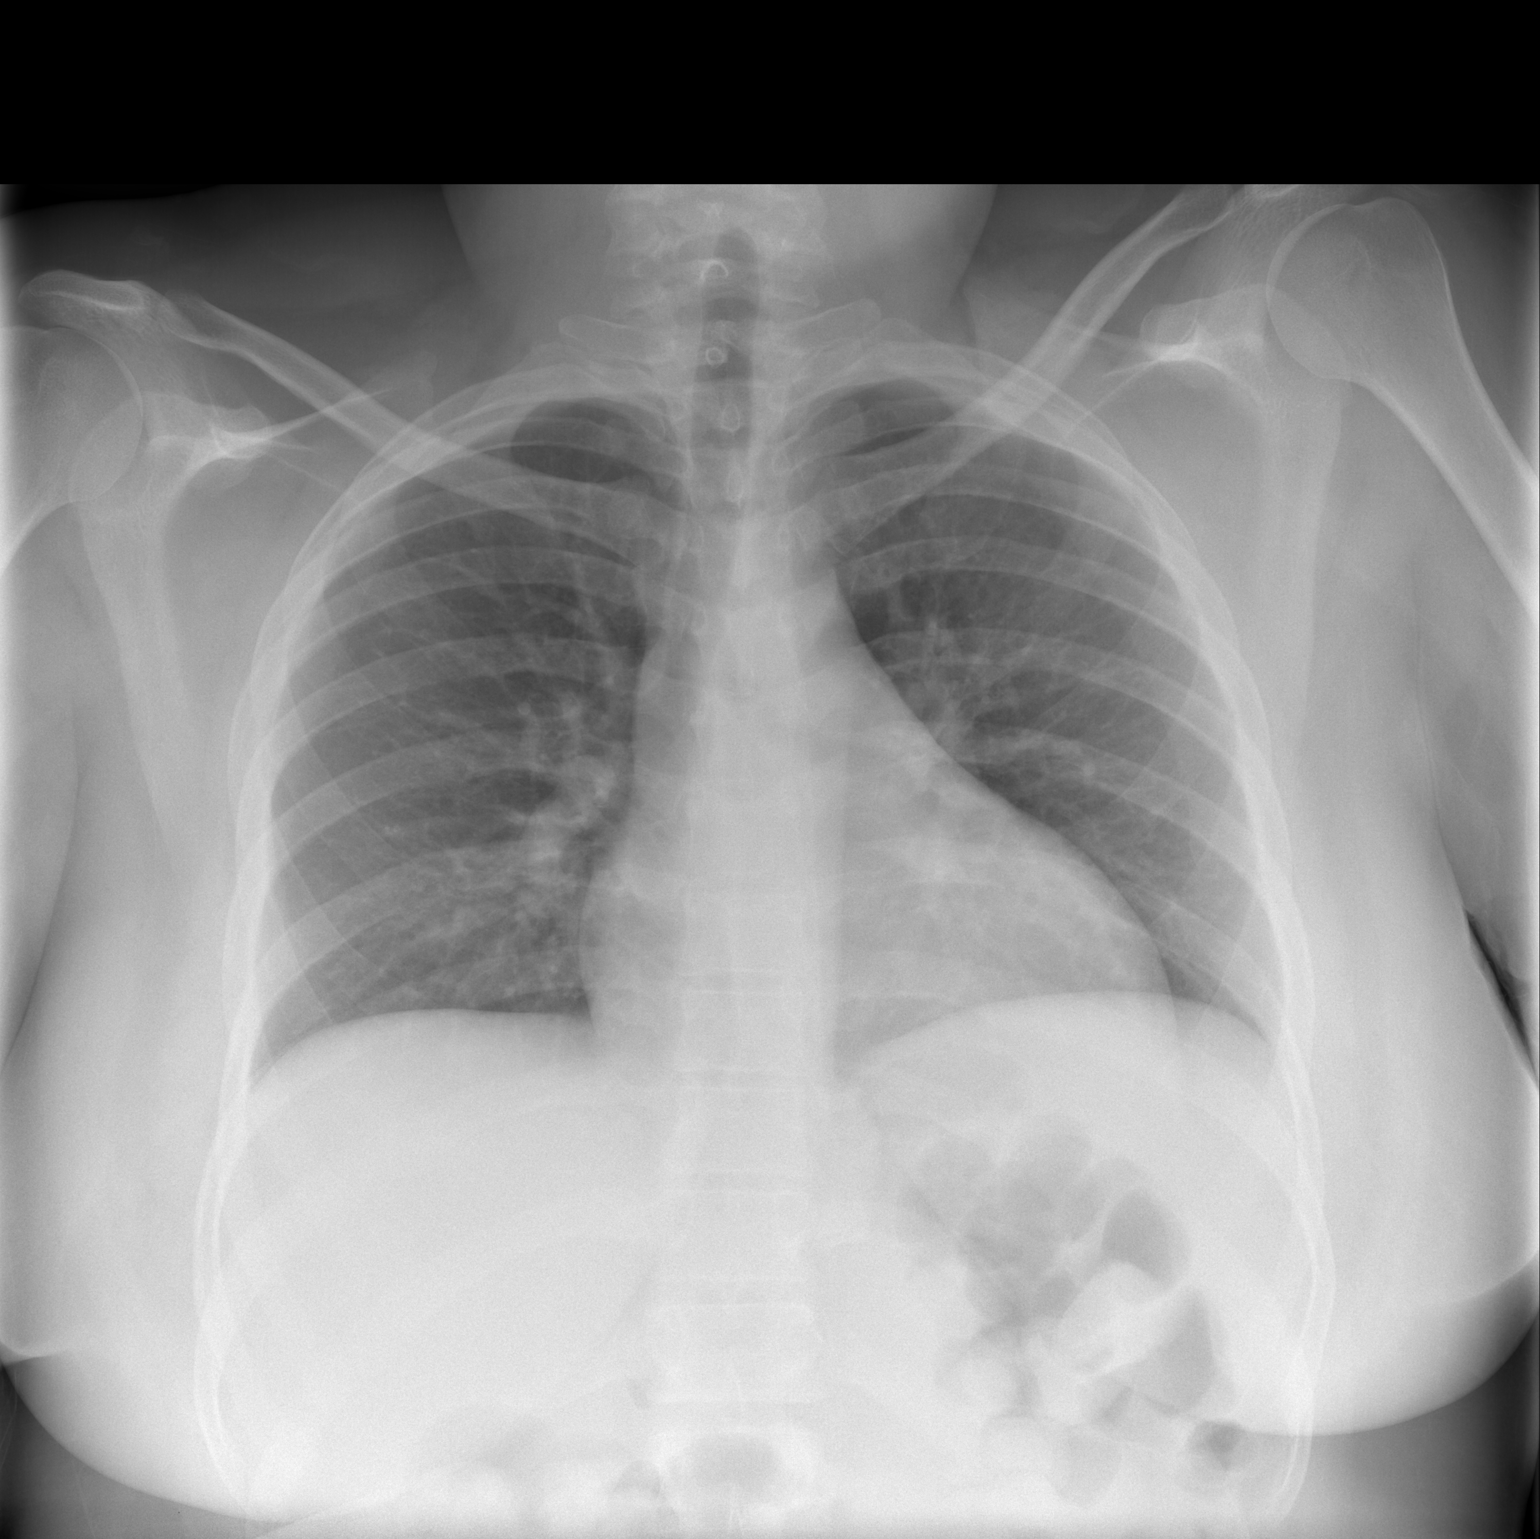

[w chest lat *]
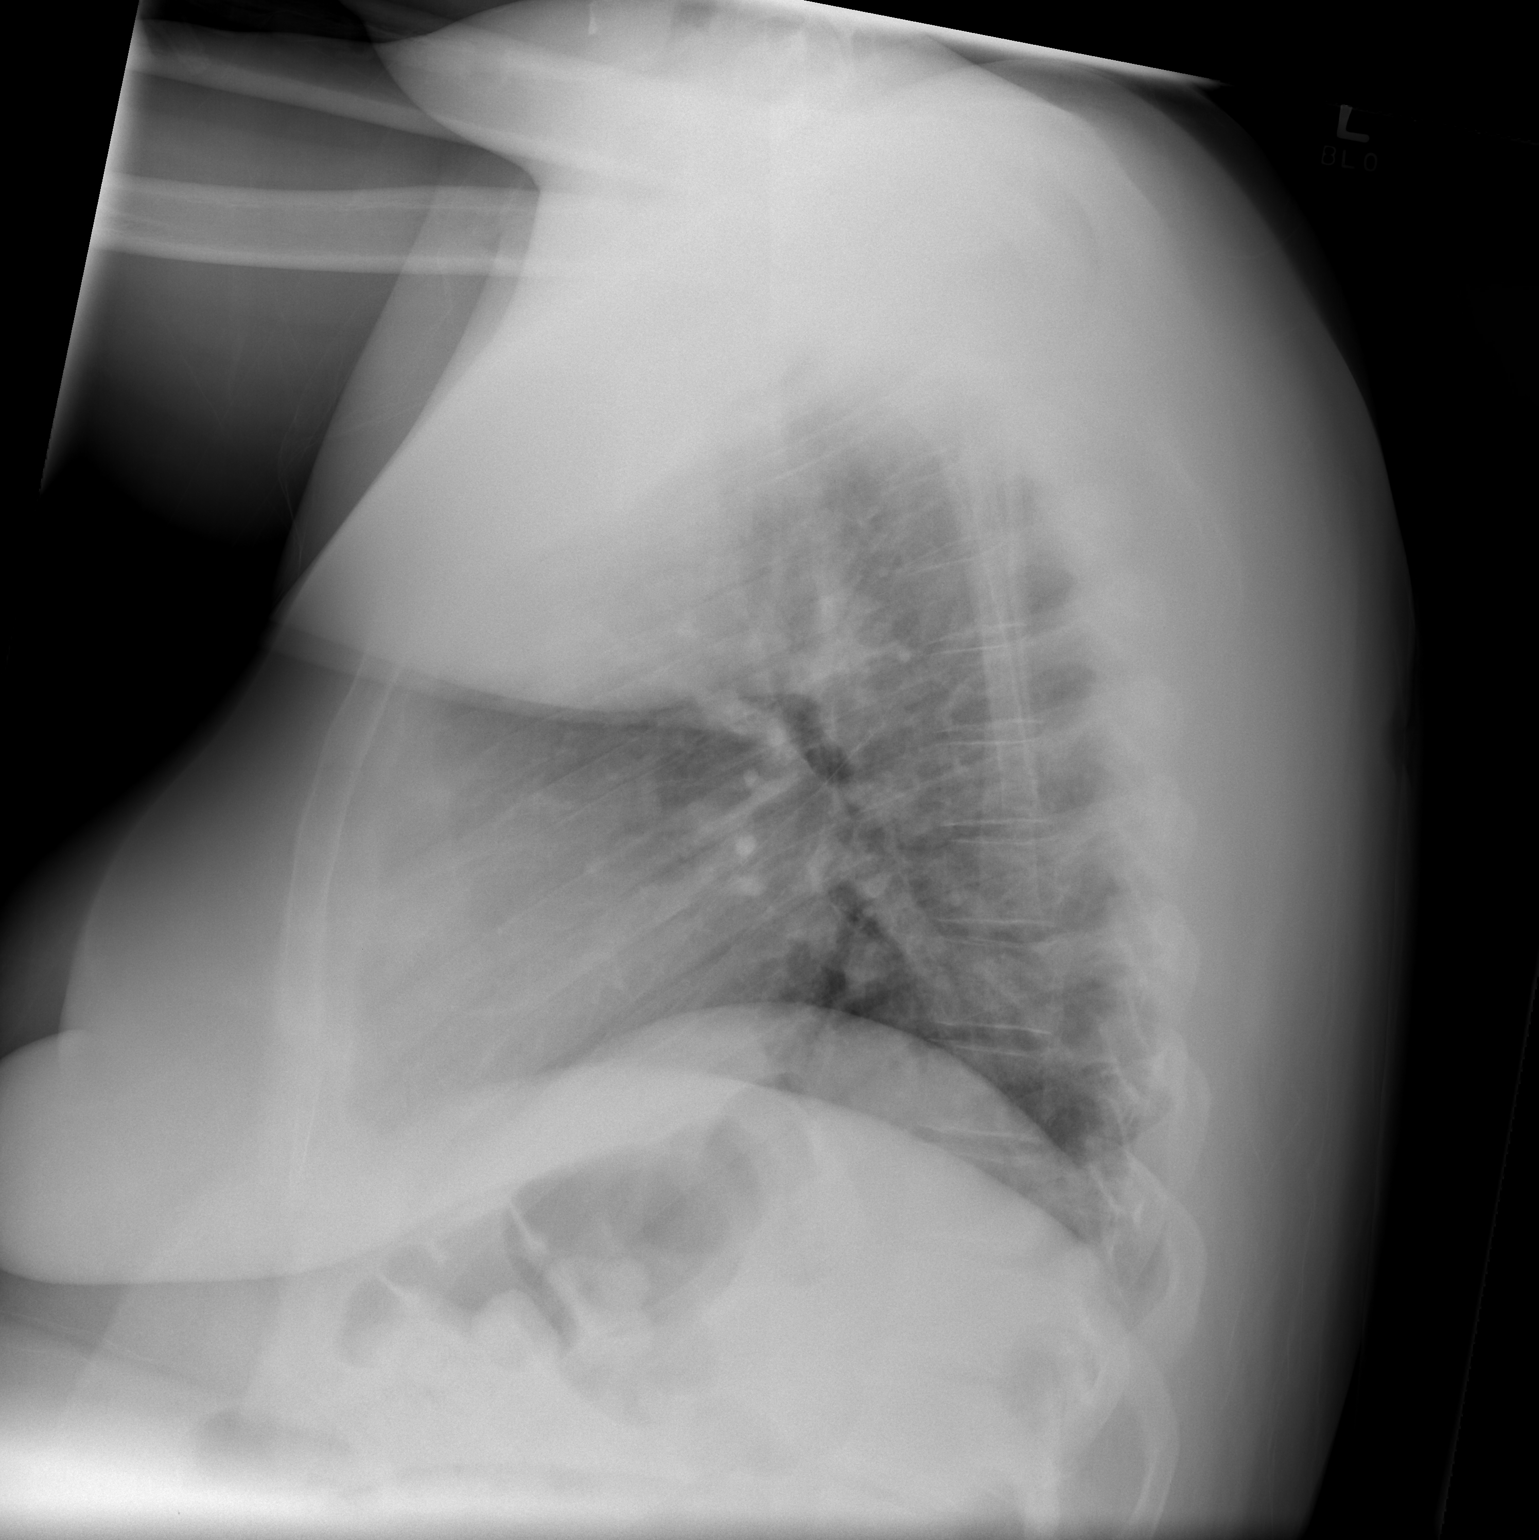

[2 of 2 positions shown; findings below may reference images not displayed]

FINDINGS: The heart size and mediastinal contours are within normal limits.  Both lungs are clear.  The visualized skeletal structures are unremarkable.
IMPRESSION: No active cardiopulmonary disease.

## 2008-12-19 IMAGING — US US PELVIS COMPLETE MODIFY
1 series · 14 of 25 positions shown · non-contrast
Comparison: CT abdomen and pelvis 09/27/2005.

CLINICAL DATA: Right-sided pelvic pain. History of cervical cancer with surgery
in 0221. LMP 06/30/2006.

TRANSABDOMINAL AND TRANSVAGINAL PELVIC ULTRASOUND 07/18/2006:
TECHNIQUE: Initially, transabdominal imaging was performed using the bladder as
an acoustical window. Subsequently, the bladder was emptied and transvaginal
imaging was performed.

[Series 1: unknown · 0.40mm/px · 14 of 44 slices shown]
[im 1/44]
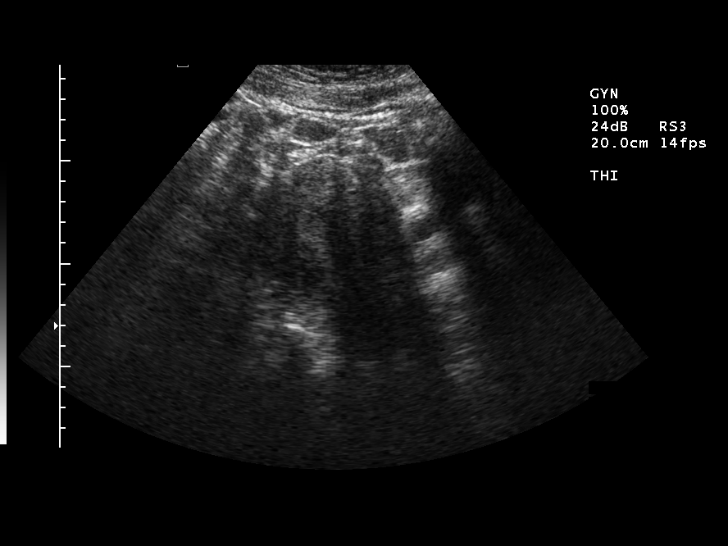
[im 4/44]
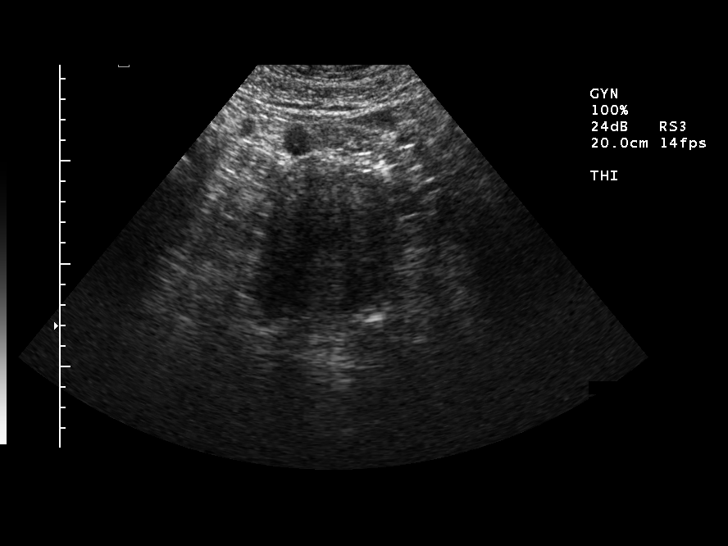
[im 8/44]
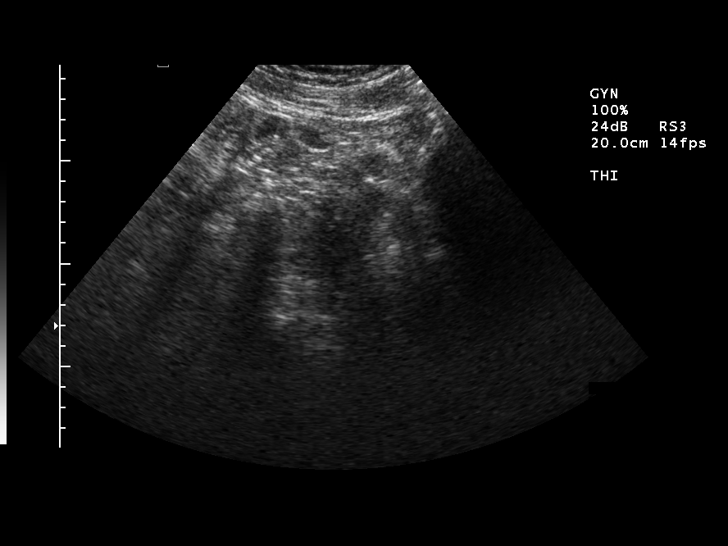
[im 11/44]
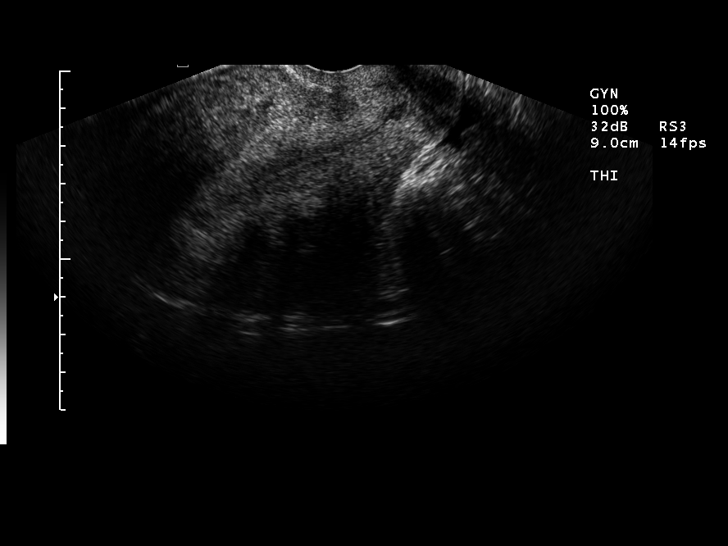
[im 15/44]
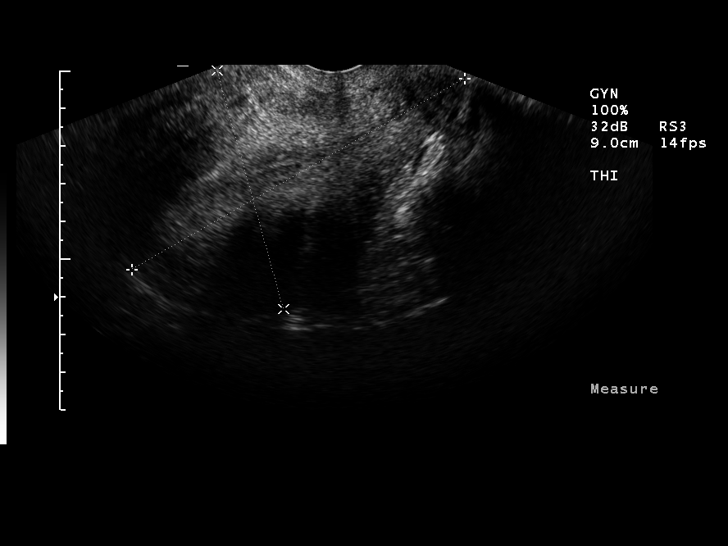
[im 17/44]
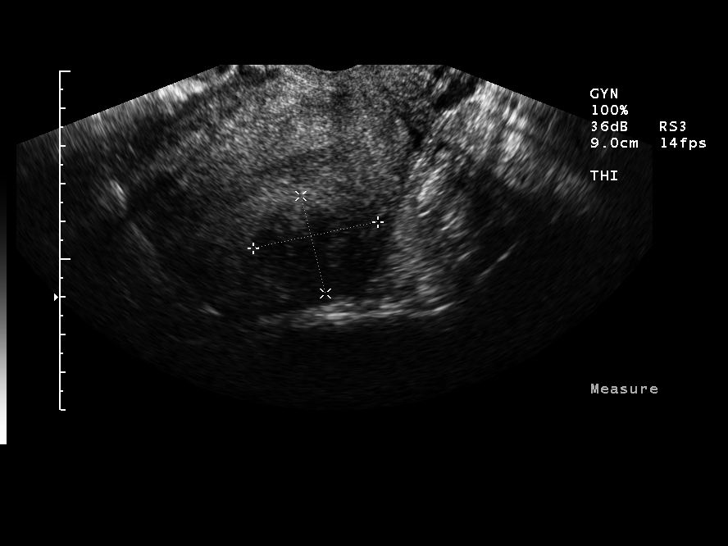
[im 20/44]
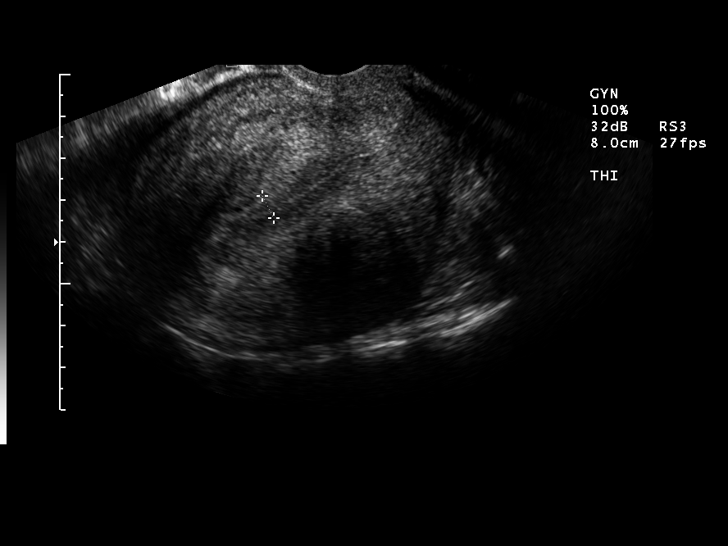
[im 24/44]
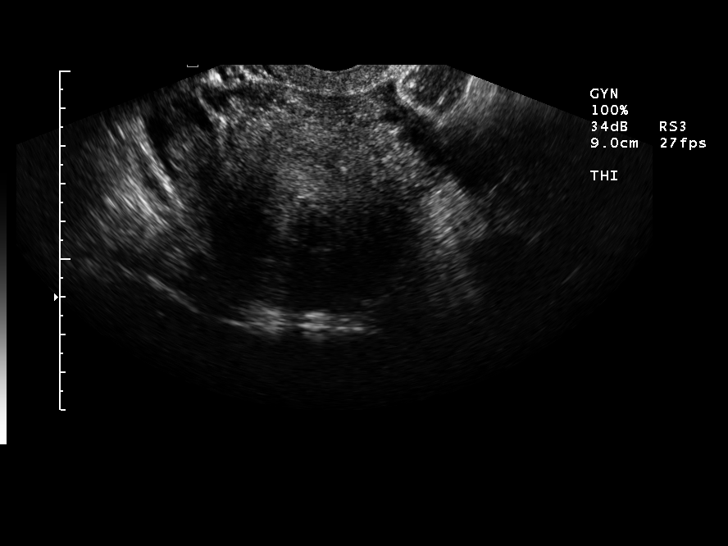
[im 27/44]
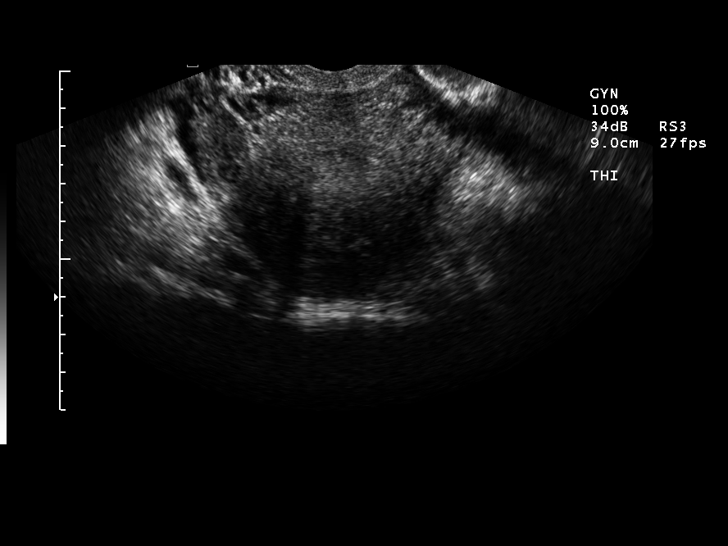
[im 29/44]
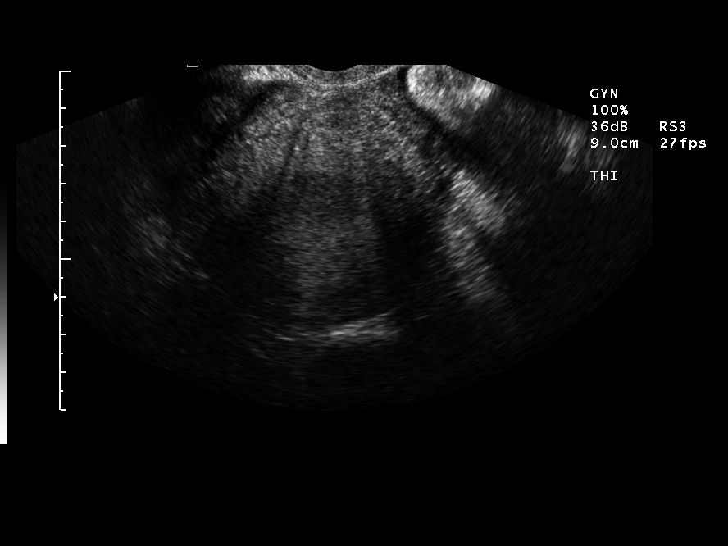
[im 33/44]
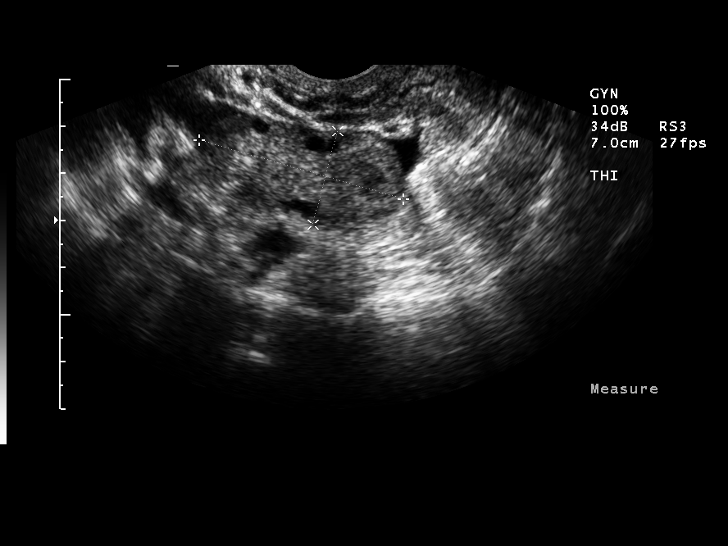
[im 36/44]
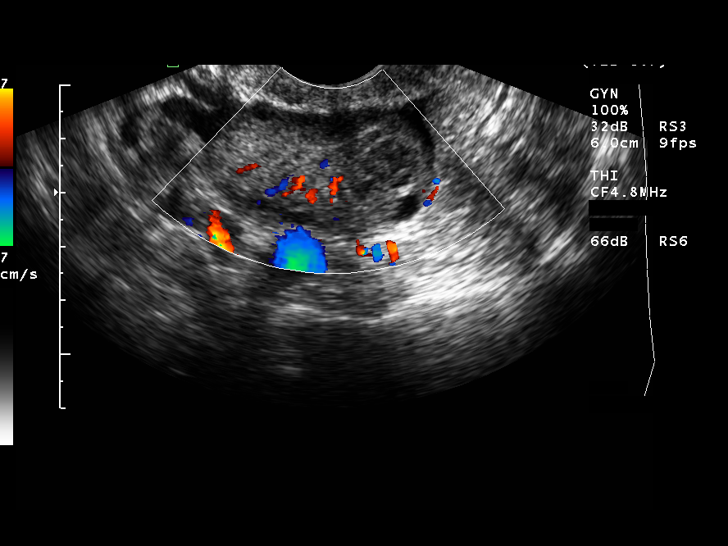
[im 40/44]
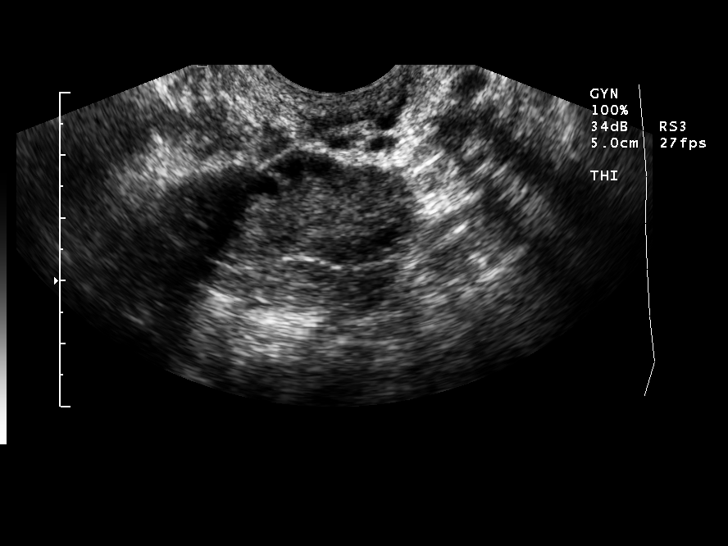
[im 44/44]
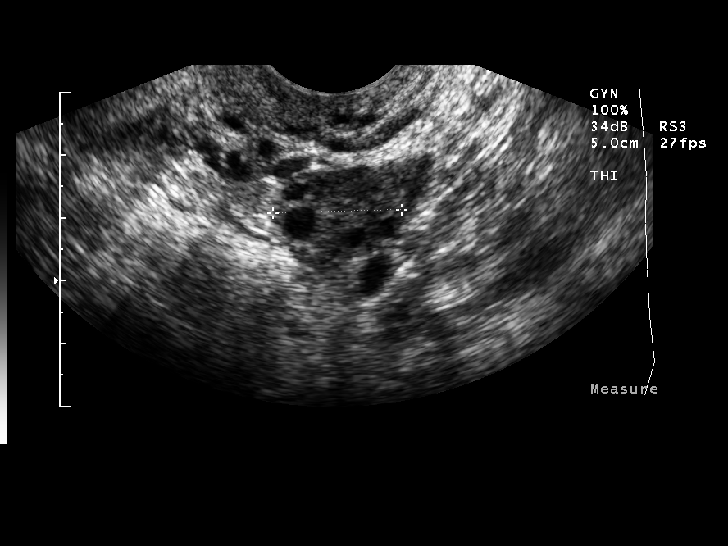

[14 of 25 positions shown; findings below may reference images not displayed]

FINDINGS: At least 3 measurable uterine fibroids, the largest in the posterior
fundus measuring approximately 3.4 x 2.7 x 3.7 cm. Endometrium normal and
trilayered in appearance measuring 6 mm in thickness; no submucosal fibroids.

Right ovary measures approximately 4.5 x 2.1 x 2.4 cm. It contains an
approximately 1.6 x 1.6 x 1.3 cm complex cyst with excellent acoustic
enhancement. Left ovary normal in appearance containing several small follicles.
It measures approximately 3.2 x 1.7 x 2.1 cm. Minimal free fluid noted in the
cul-de-sac.
IMPRESSION: 1. At least 3 measurable uterine fibroids, the largest in the posterior fundus
approximating 3.5 cm. No submucosal fibroids. 
2. 1.6 cm hemorrhagic cyst in the right ovary. Normal appearing left ovary.

## 2009-01-26 ENCOUNTER — Inpatient Hospital Stay (HOSPITAL_COMMUNITY): Admission: AD | Admit: 2009-01-26 | Discharge: 2009-01-26 | Payer: Self-pay | Admitting: Obstetrics and Gynecology

## 2009-02-23 ENCOUNTER — Emergency Department (HOSPITAL_COMMUNITY): Admission: EM | Admit: 2009-02-23 | Discharge: 2009-02-23 | Payer: Self-pay | Admitting: Emergency Medicine

## 2009-03-16 DIAGNOSIS — I219 Acute myocardial infarction, unspecified: Secondary | ICD-10-CM

## 2009-03-16 HISTORY — PX: SALPINGOOPHORECTOMY: SHX82

## 2009-03-16 HISTORY — PX: TOTAL ABDOMINAL HYSTERECTOMY: SHX209

## 2009-03-16 HISTORY — DX: Acute myocardial infarction, unspecified: I21.9

## 2009-04-24 IMAGING — CT CT ANGIO CHEST
2 of 9 series · 15 of 36 positions shown · IV contrast (APPLIED)
Comparison: none

CLINICAL DATA: Left-sided chest pain. Assess for pulmonary emboli.
 CT ANGIOGRAPHY OF CHEST WITH CONTRAST ? 11/21/06:
TECHNIQUE: Multidetector CT imaging of the chest was performed during bolus injection of intravenous contrast.  Multiplanar CT angiographic image reconstructions were generated to evaluate the vascular anatomy. 
 Contrast:  80 cc Omnipaque 350 IV.

[Series 7: pulm embolism 1.0 b25f thins · axial · 0.68mm/px · z∈[-240,-86]mm · 14 of 178 slices shown]
[im 12/178  lung]
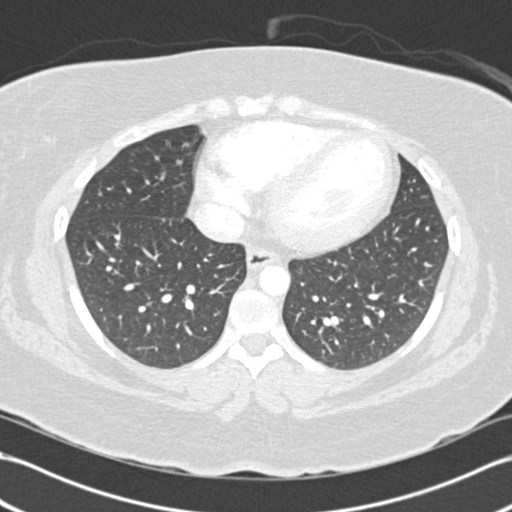
[im 24/178  mediastinal]
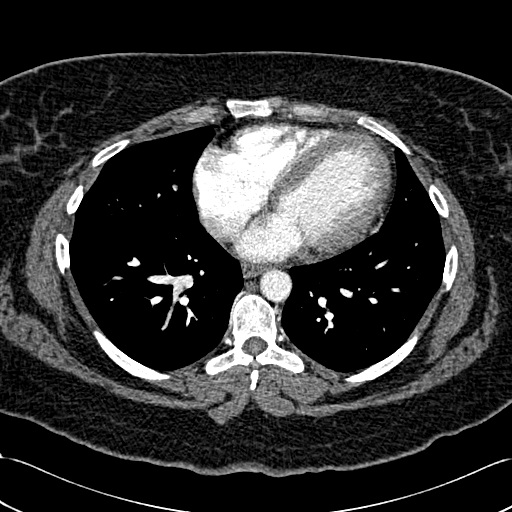
[im 36/178  lung]
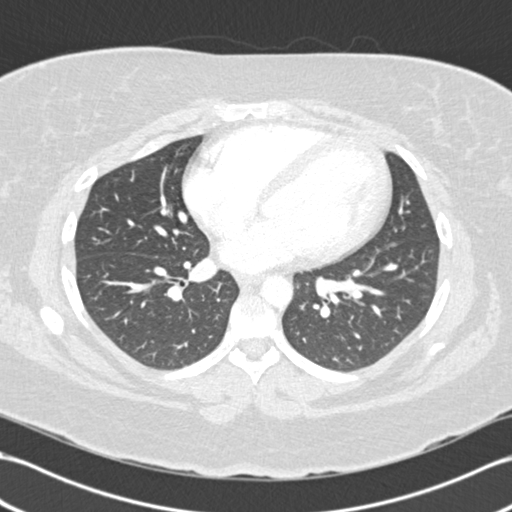
[im 48/178  mediastinal]
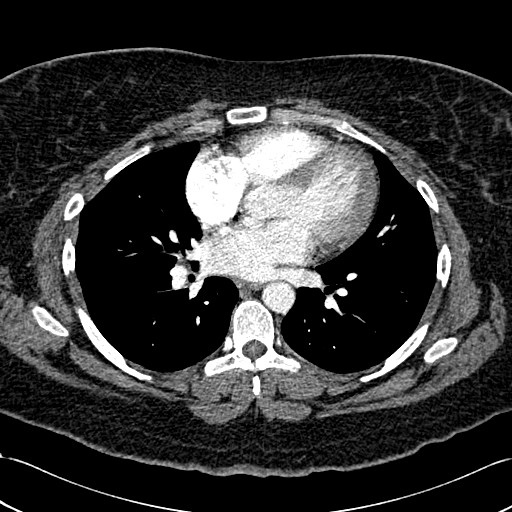
[im 60/178  lung]
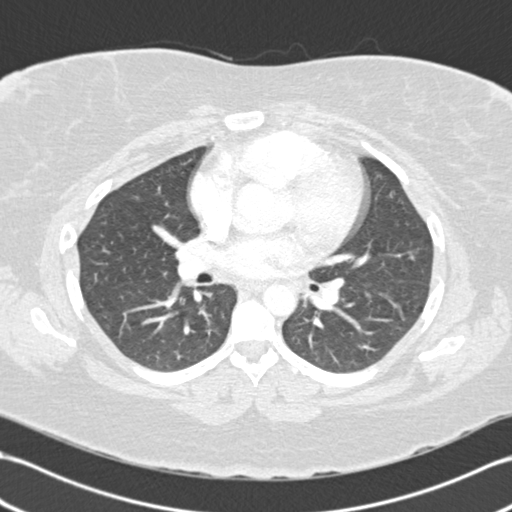
[im 71/178  mediastinal]
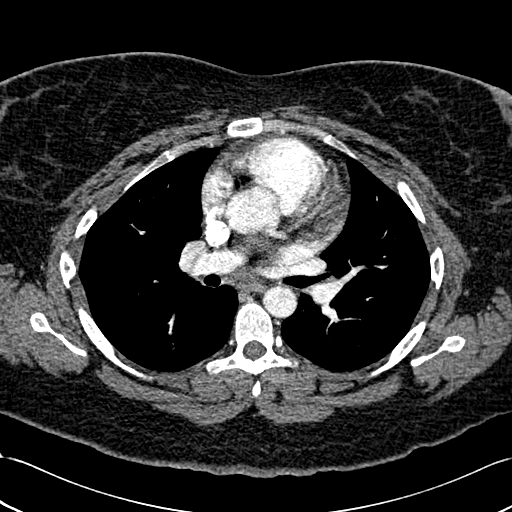
[im 83/178  lung]
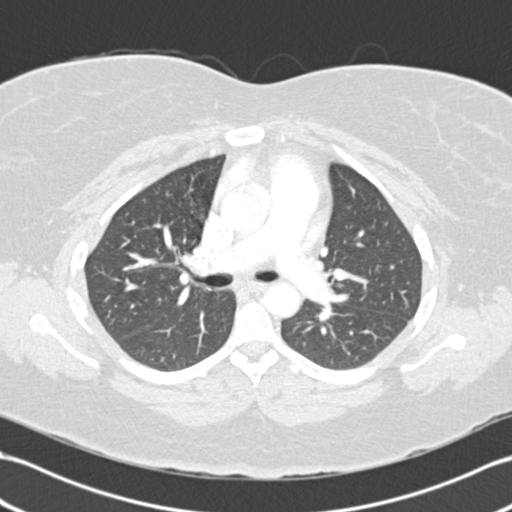
[im 95/178  mediastinal]
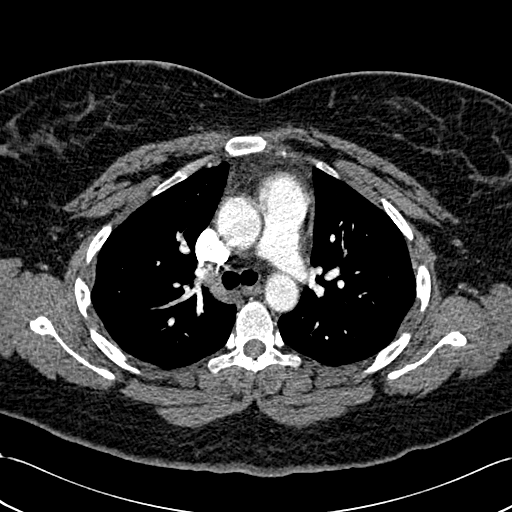
[im 107/178  lung]
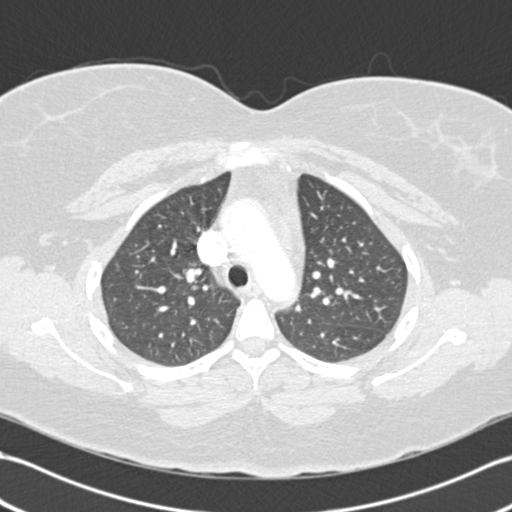
[im 119/178  mediastinal]
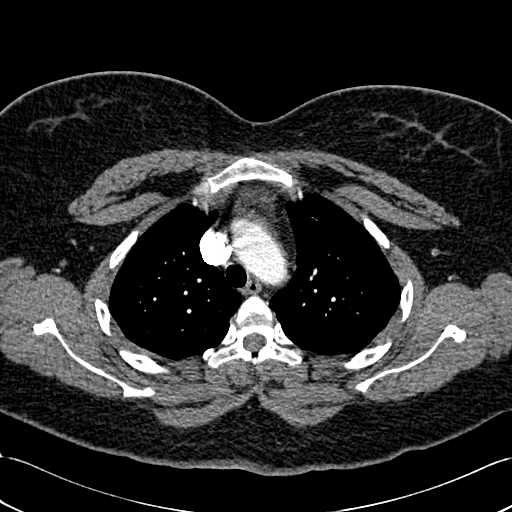
[im 130/178  lung]
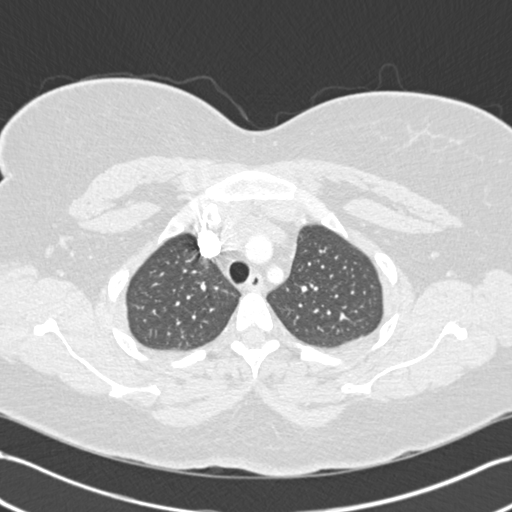
[im 142/178  mediastinal]
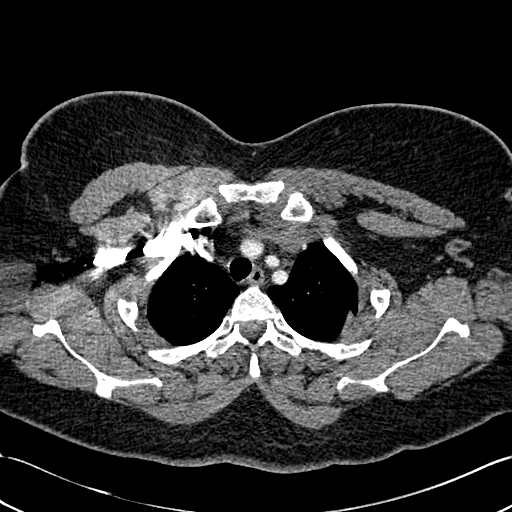
[im 154/178  lung]
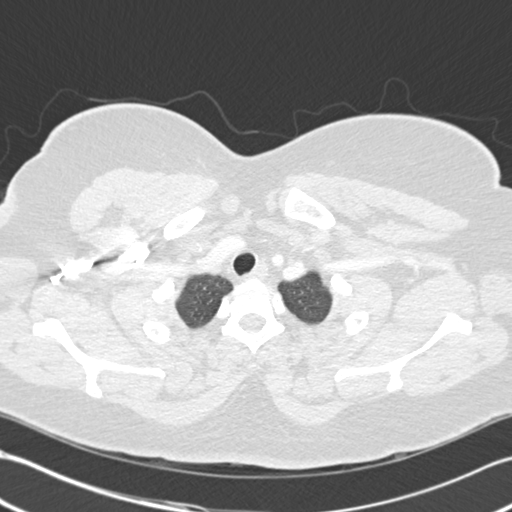
[im 166/178  mediastinal]
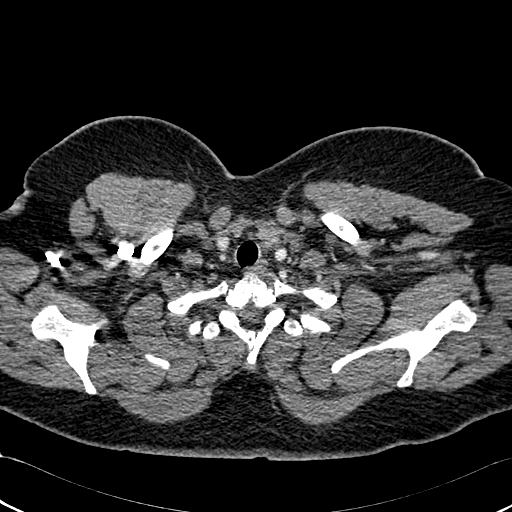

[Series 602: cor · coronal · 0.68mm/px · 1 of 99 slices shown]
[im 50/99  mediastinal]
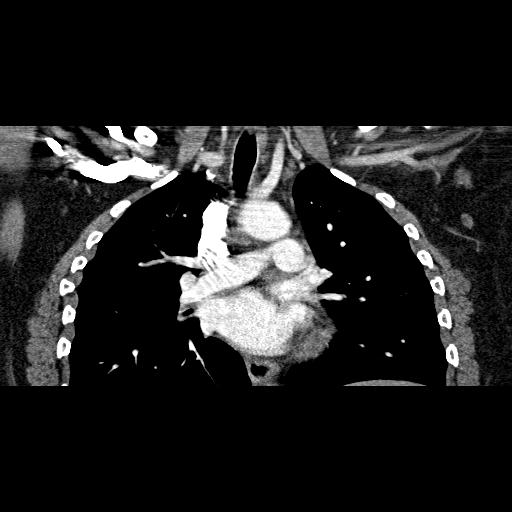

[15 of 36 positions shown; findings below may reference images not displayed]

FINDINGS: The lungs are clear.   No pleural or pericardial fluid.  The pulmonary arterial tree does not show any evidence of emboli.  No aortic dissection.  No sign of adenopathy or mass.  Scans in the upper abdomen are unremarkable.
IMPRESSION: Negative CT angiography of the chest.

## 2009-05-27 IMAGING — CT CT PELVIS W/ CM
2 of 5 series · 17 of 46 positions shown, 19 images · IV contrast (omnipaque)
Comparison: CT of 09/27/05.

CLINICAL DATA: Abdominal and pelvic pain.  Prior partial hysterectomy.  
ABDOMEN CT WITH CONTRAST:
TECHNIQUE: Multidetector CT imaging of the abdomen was performed following the standard protocol during bolus administration of intravenous contrast.
Contrast:  100 cc Omnipaque 300 and oral contrast.
TECHNIQUE: Multidetector CT imaging of the pelvis was performed following the standard protocol during bolus administration of intravenous contrast.

[Series 3: abd/pelv with 5.0 b31f st · axial · 0.79mm/px · z∈[-512,-98]mm · 14 of 95 slices shown, 16 images]
[im 6/95  soft-tissue]
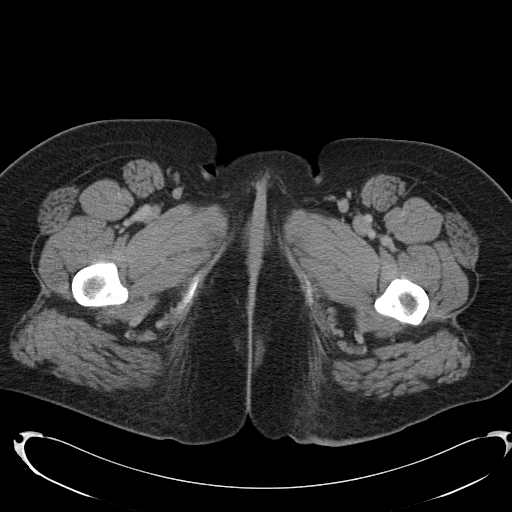
[im 6/95  bone]
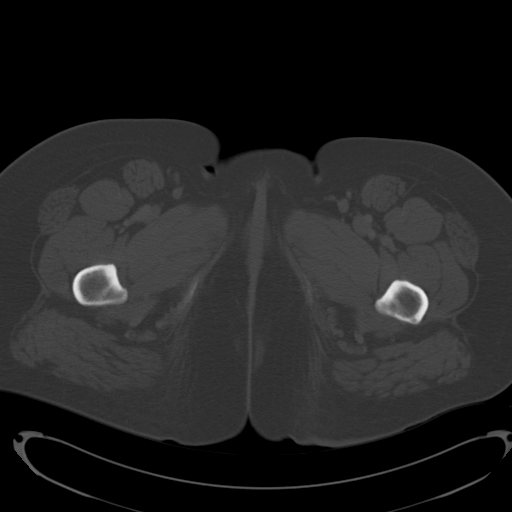
[im 12/95  soft-tissue]
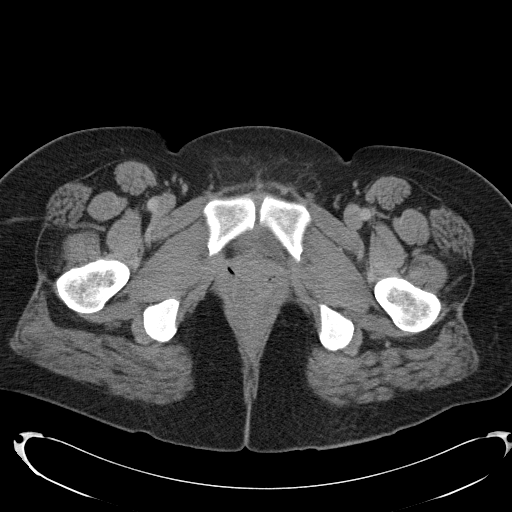
[im 17/95  soft-tissue]
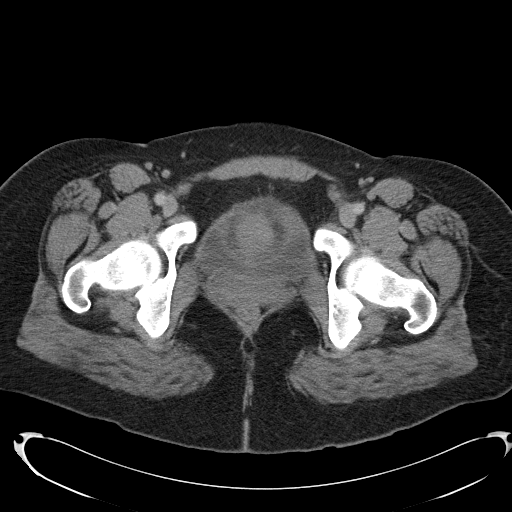
[im 28/95  soft-tissue]
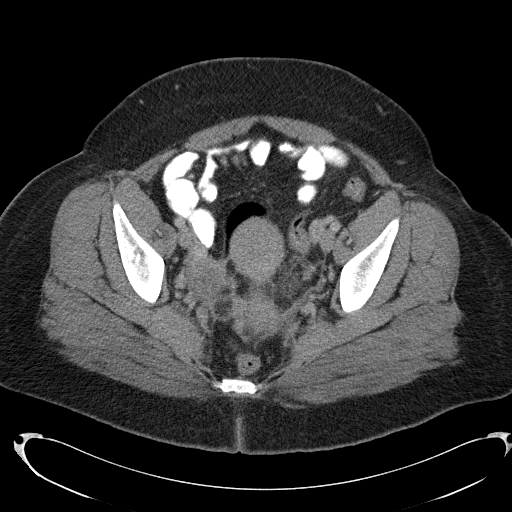
[im 34/95  soft-tissue]
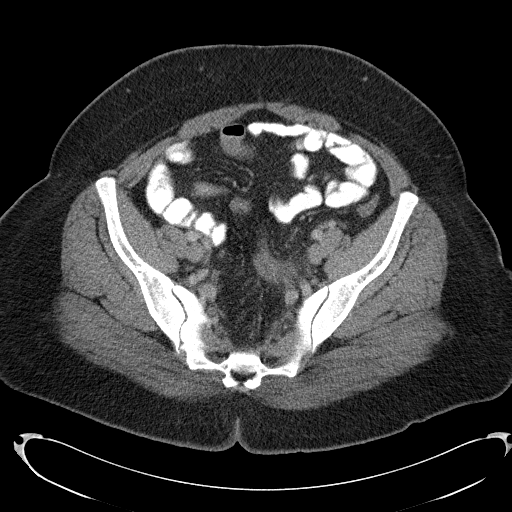
[im 39/95  soft-tissue]
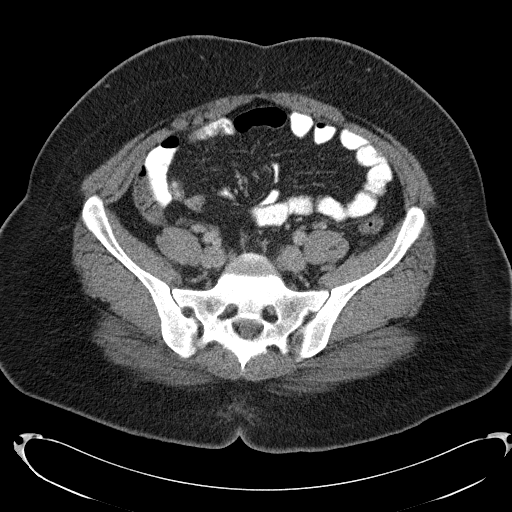
[im 45/95  soft-tissue]
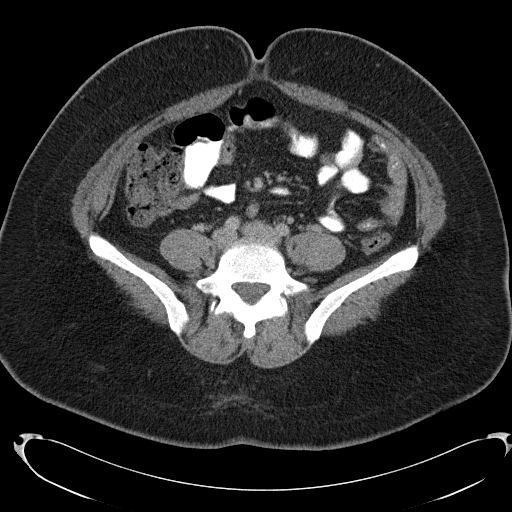
[im 50/95  soft-tissue]
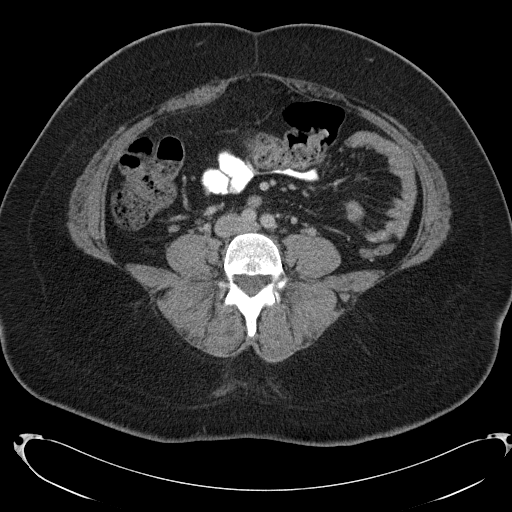
[im 56/95  soft-tissue]
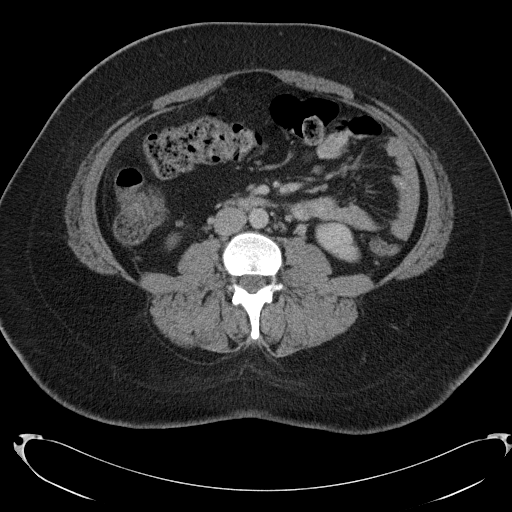
[im 56/95  bone]
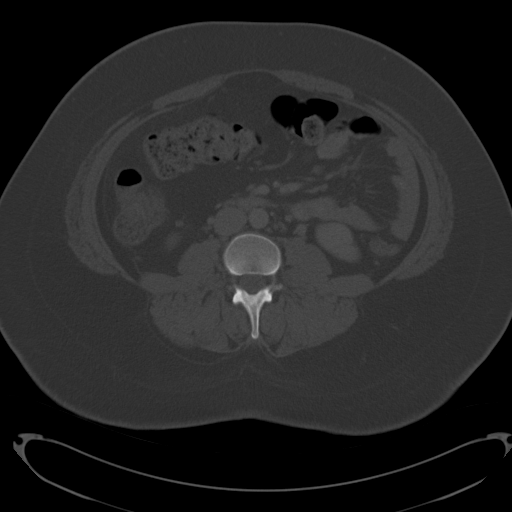
[im 61/95  soft-tissue]
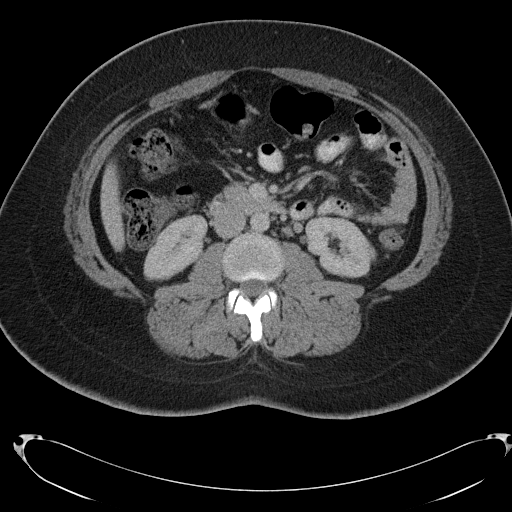
[im 72/95  soft-tissue]
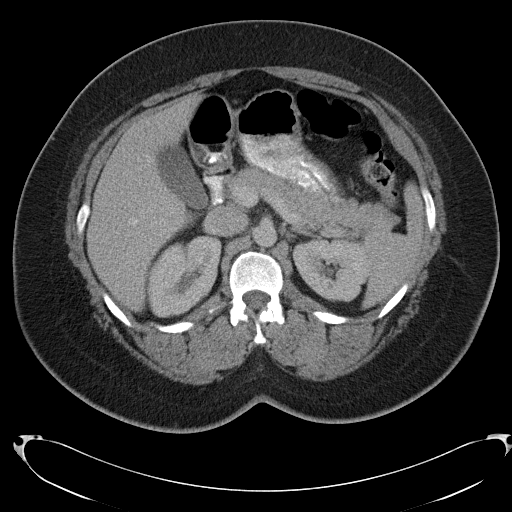
[im 78/95  soft-tissue]
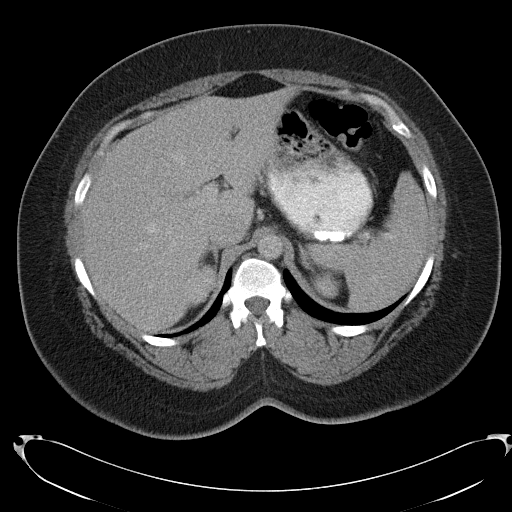
[im 83/95  soft-tissue]
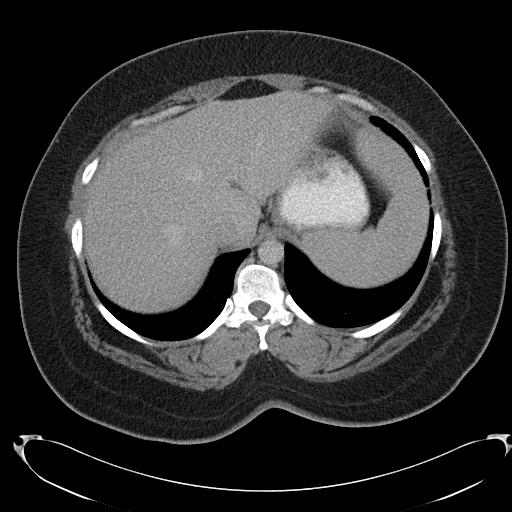
[im 89/95  soft-tissue]
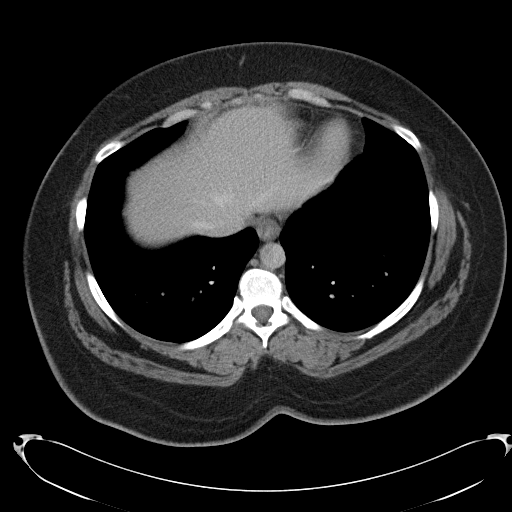

[Series 6: abd/pelv with 2.0 spo st · coronal · 0.92mm/px · 3 of 148 slices shown]
[im 50/148  soft-tissue]
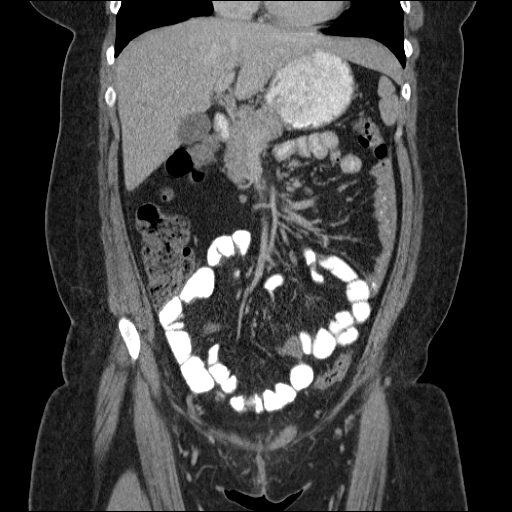
[im 66/148  soft-tissue]
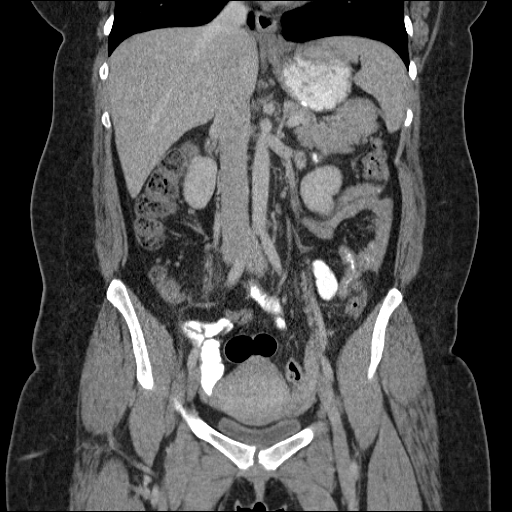
[im 82/148  soft-tissue]
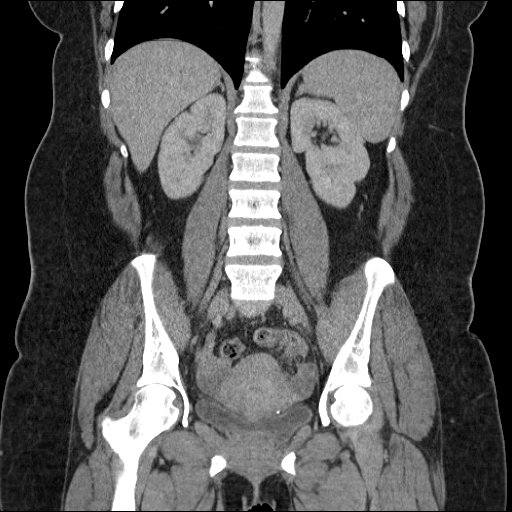

[17 of 46 positions shown; findings below may reference images not displayed]

FINDINGS: The abdominal parenchymal organs are unremarkable.  There is no evidence of mass or adenopathy.  No inflammatory process or abnormal fluid collections are identified.  No other significant abnormality noted.
IMPRESSION: Negative abdomen CT.  
PELVIS CT WITH CONTRAST:
FINDINGS: There is short segmental sigmoid colonic wall thickening with mesenteric stranding noted.  No free air or pericolonic fluid collection is seen.  Trace free pelvic fluid is identified.  The uterus and ovaries are unremarkable.  No small bowel obstruction or other bowel abnormality.  No acute osseous finding.
IMPRESSION: Short segmental sigmoid colonic wall thickening, likely acute diverticulitis without abscess formation or evidence for perforation.  
Findings were called to Dr. Toma by Dr. Clariza on 12/24/06 at [DATE].

## 2009-05-27 IMAGING — CR DG ABDOMEN 1V
2 series · 2 of 2 positions shown · non-contrast
Comparison: 11/21/06.

CLINICAL DATA: 36-year-old female with abdominal and pelvic pain. 
 ABDOMEN - 1 VIEW:

[t abdomen supine (1 of 2)]
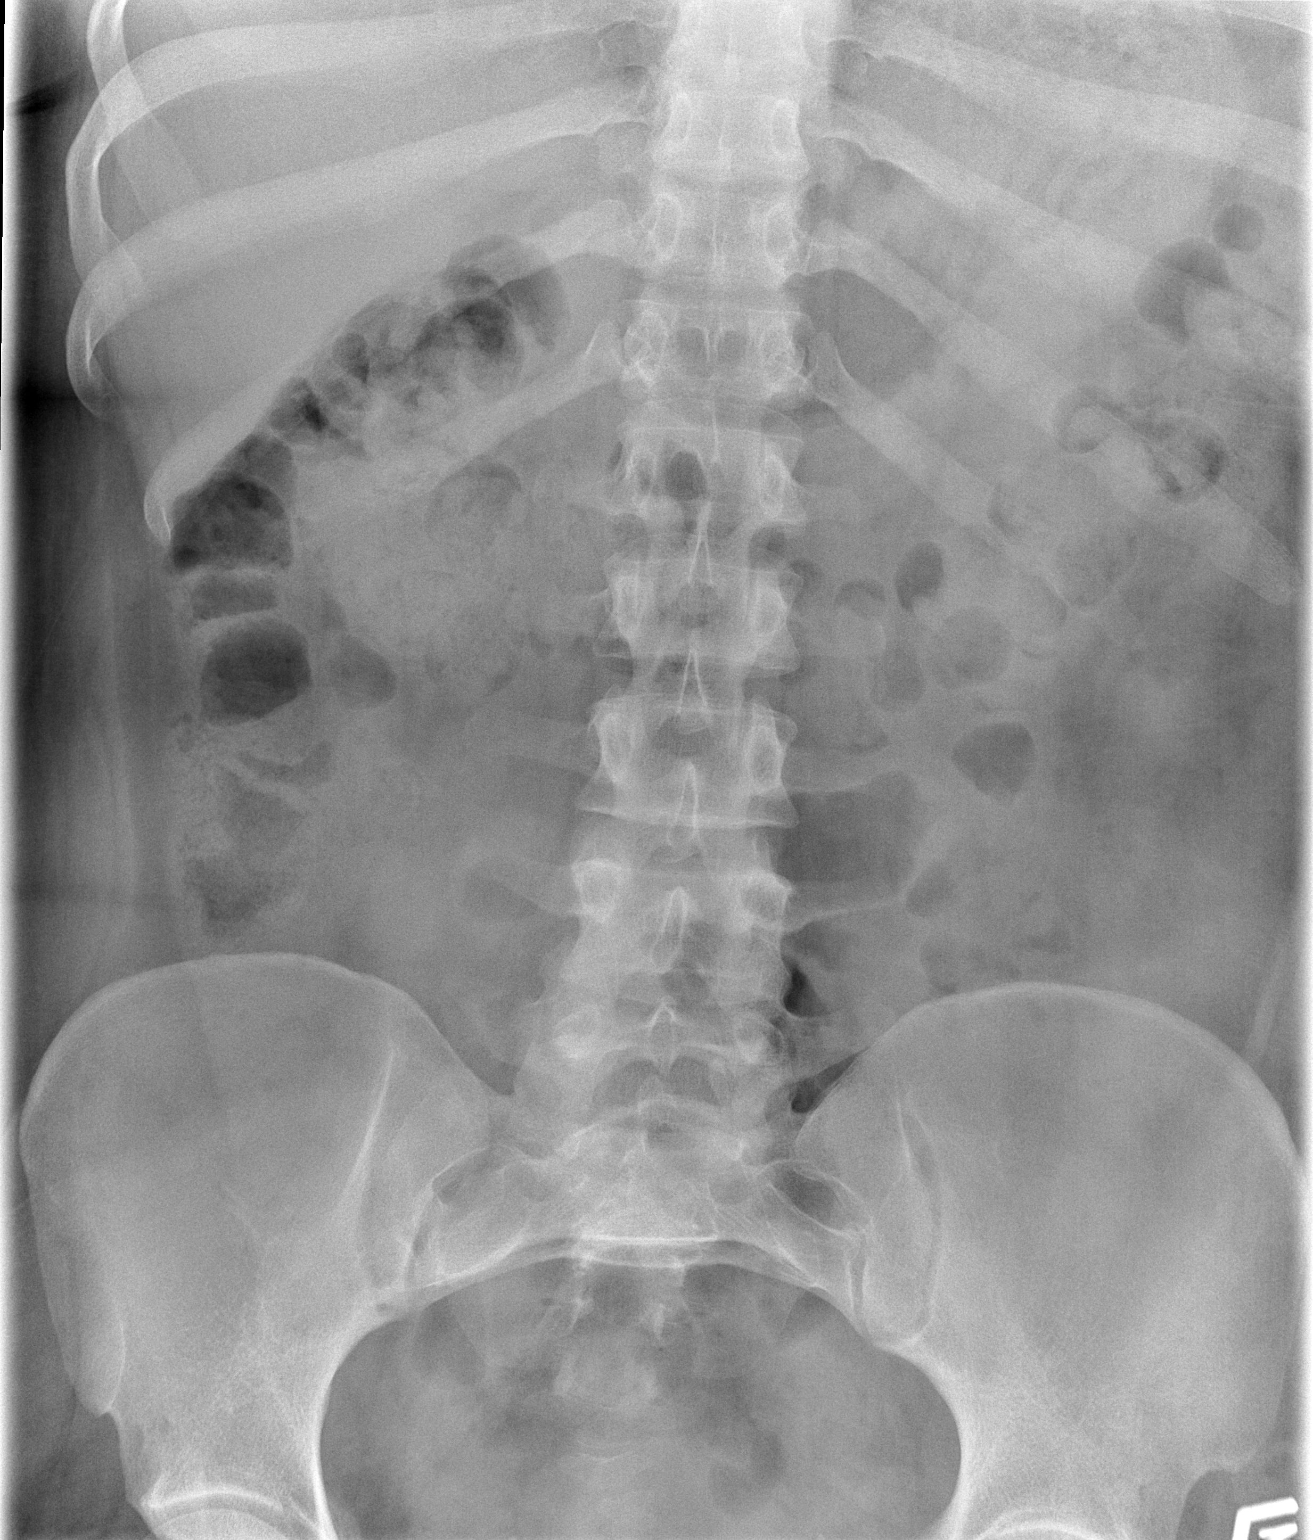

[t abdomen supine (2 of 2)]
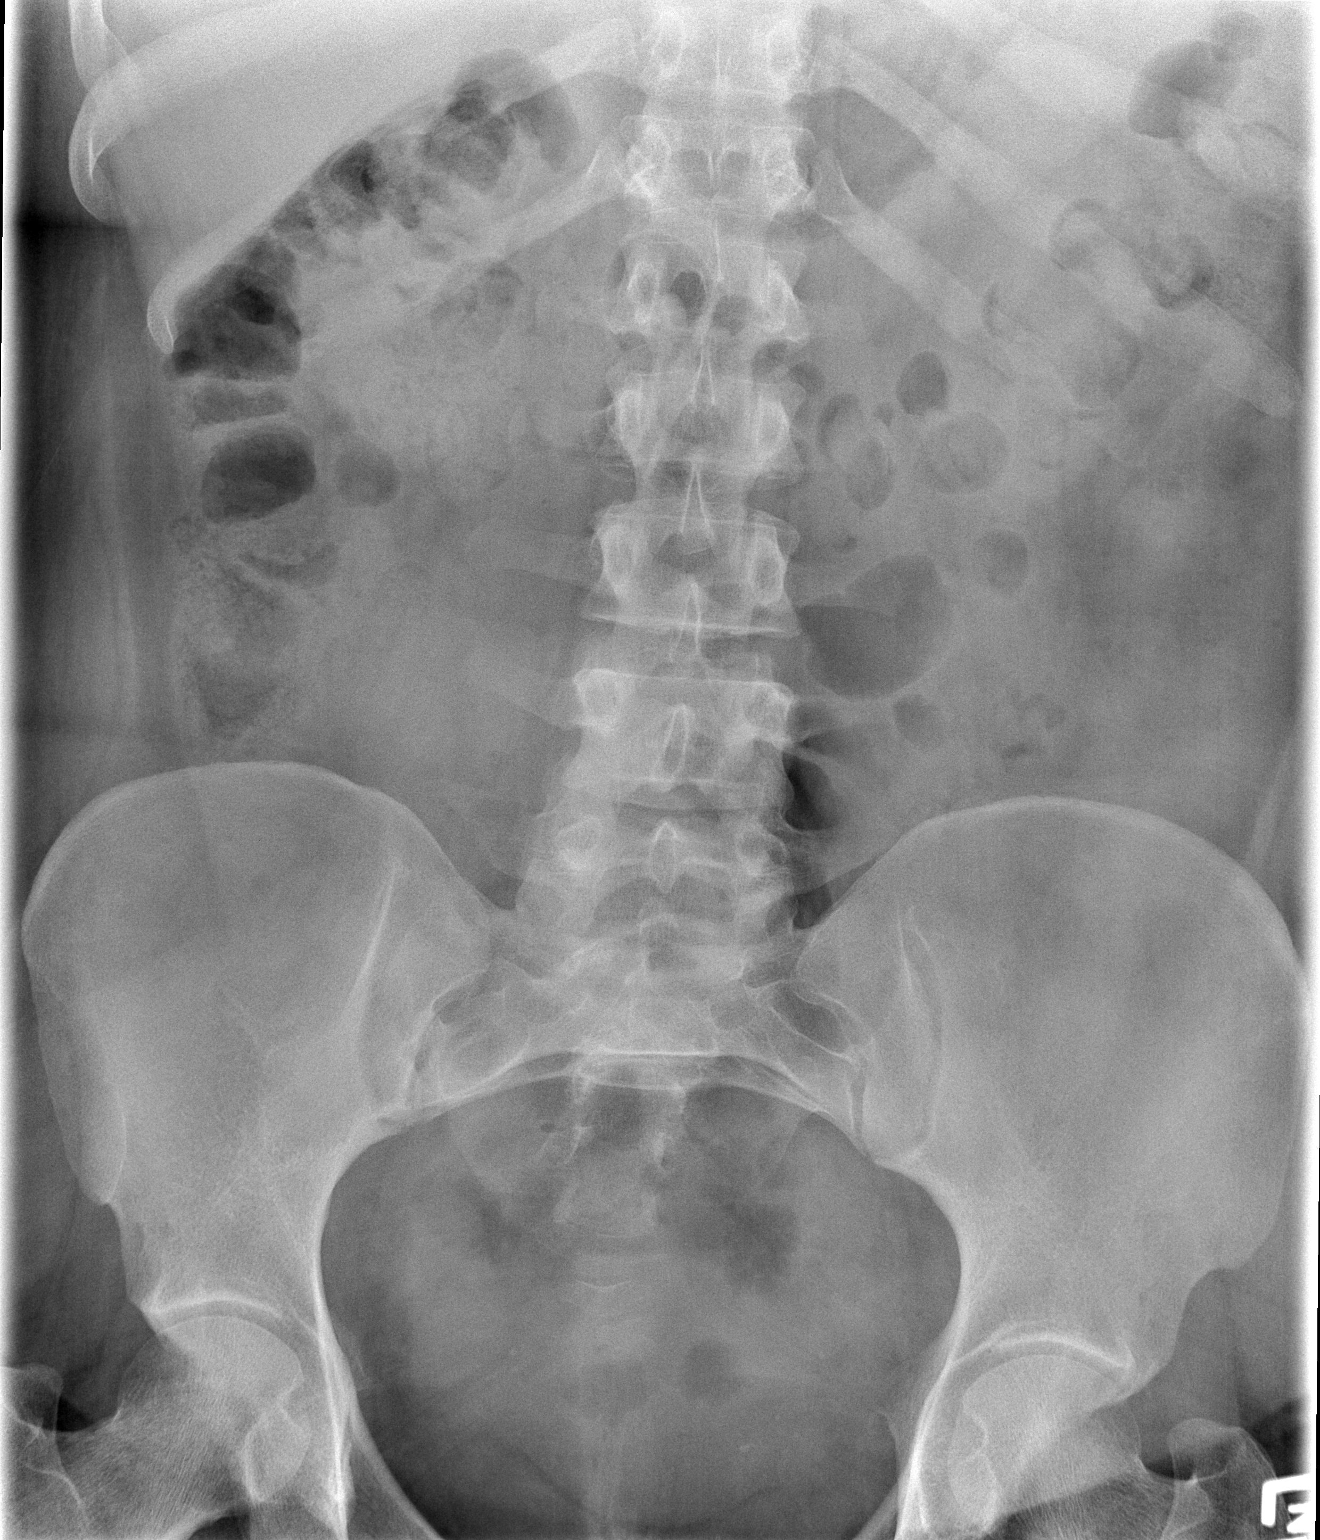

[2 of 2 positions shown; findings below may reference images not displayed]

FINDINGS: The pelvis is incompletely visualized on this one view of the abdomen. The bowel gas pattern is normal. There are small pelvic phleboliths noted.  The visualized osseous structures appear within normal limits.
IMPRESSION: Nonobstructive bowel gas pattern. The pelvis is not visualized in its entirety.

## 2009-05-27 IMAGING — CR DG LUMBAR SPINE COMPLETE 4+V
5 series · 5 of 5 positions shown · non-contrast
Comparison: MRI 07/17/06

CLINICAL DATA: 36 year-old female with abdominal and pelvic pain.
 LUMBAR SPINE ? 5 VIEWS:

[t l-spine a.p.]
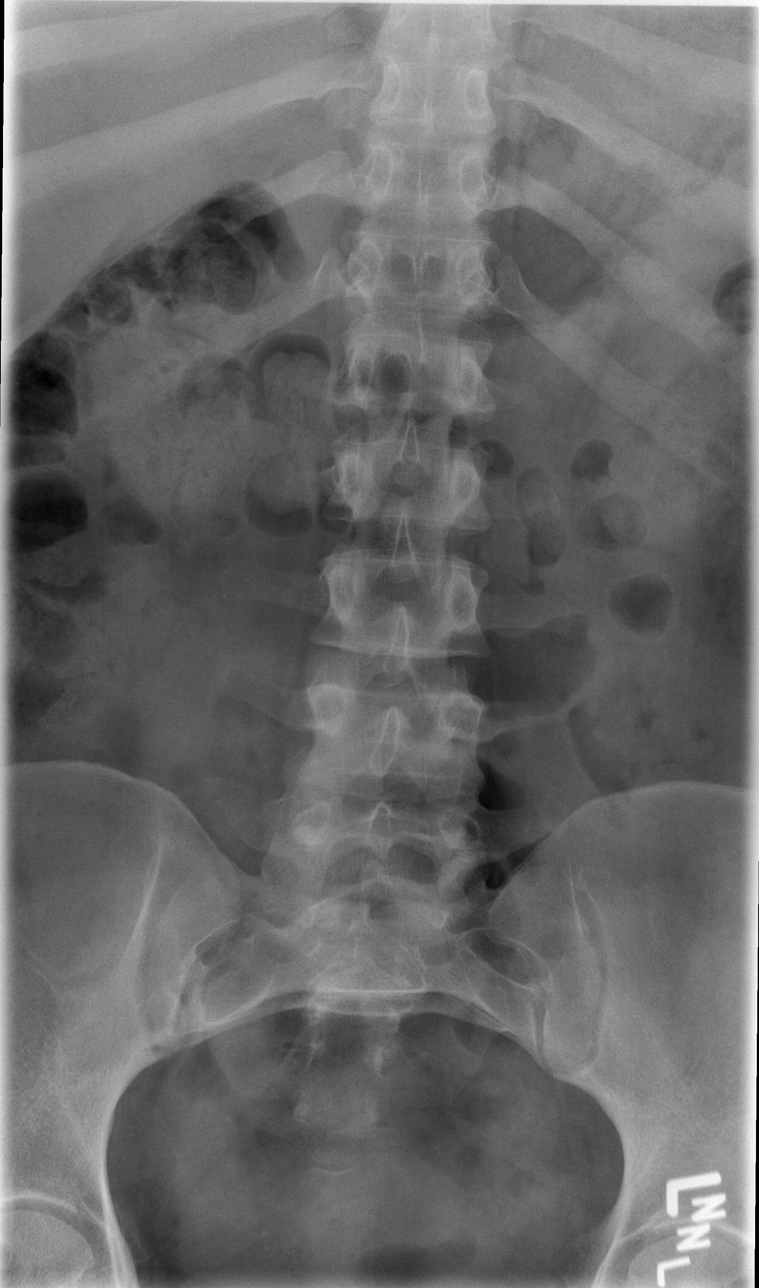

[t l-spine oblique exposure (1 of 2)]
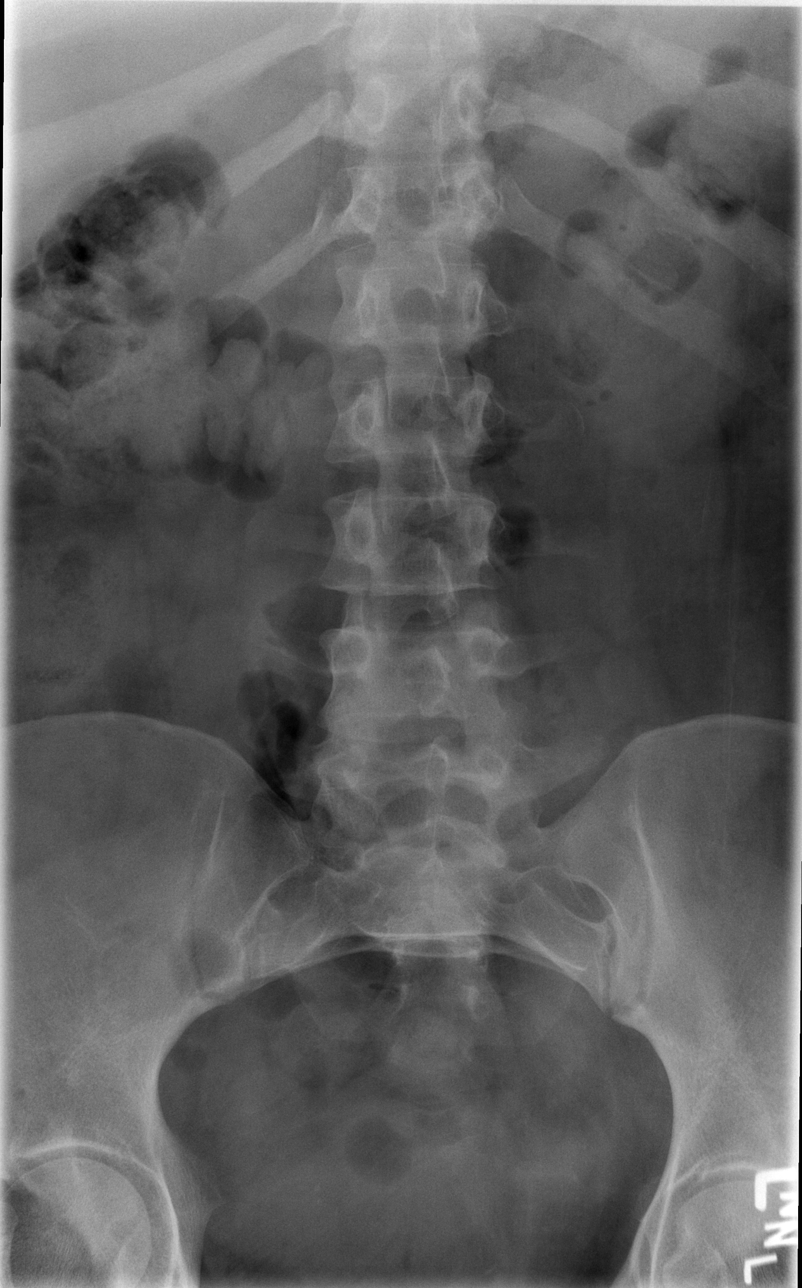

[t l-spine oblique exposure (2 of 2)]
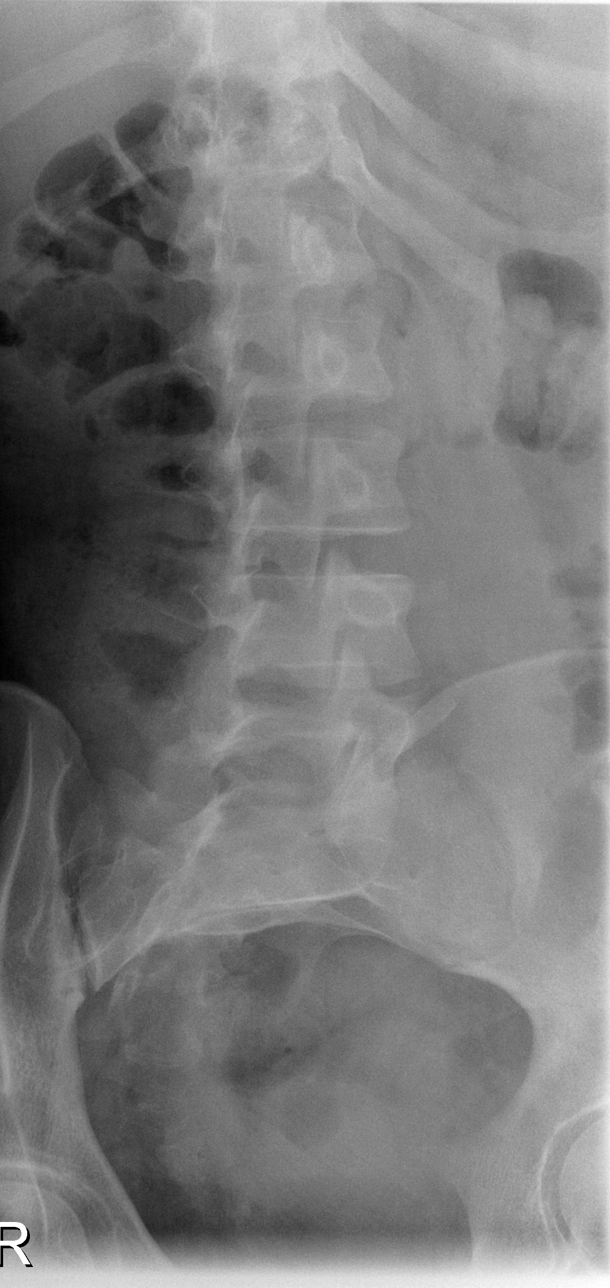

[t l-spine lat]
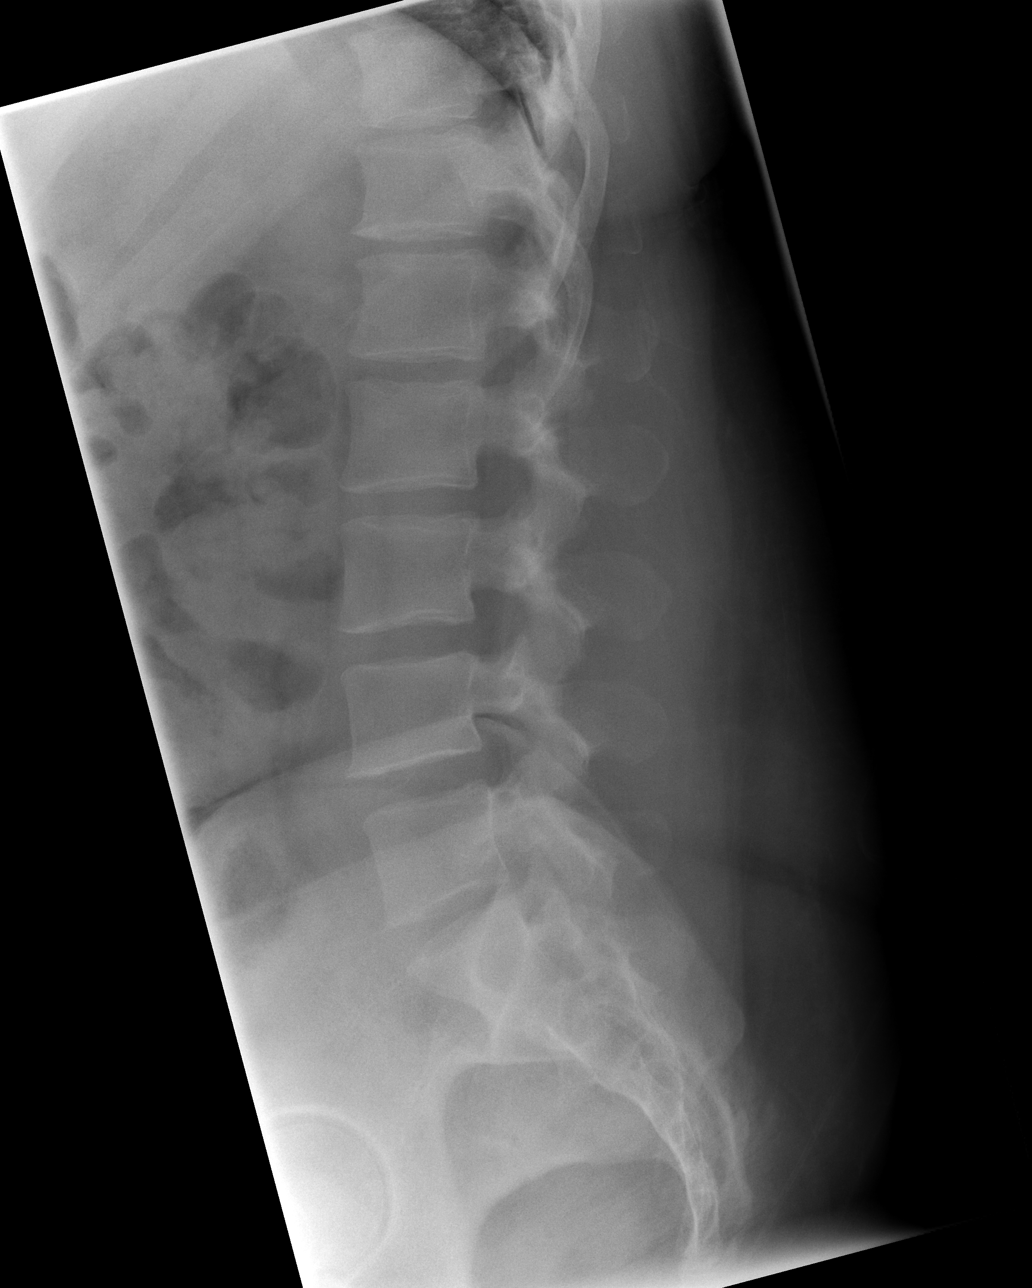

[t l-spine l5-s1 spot]
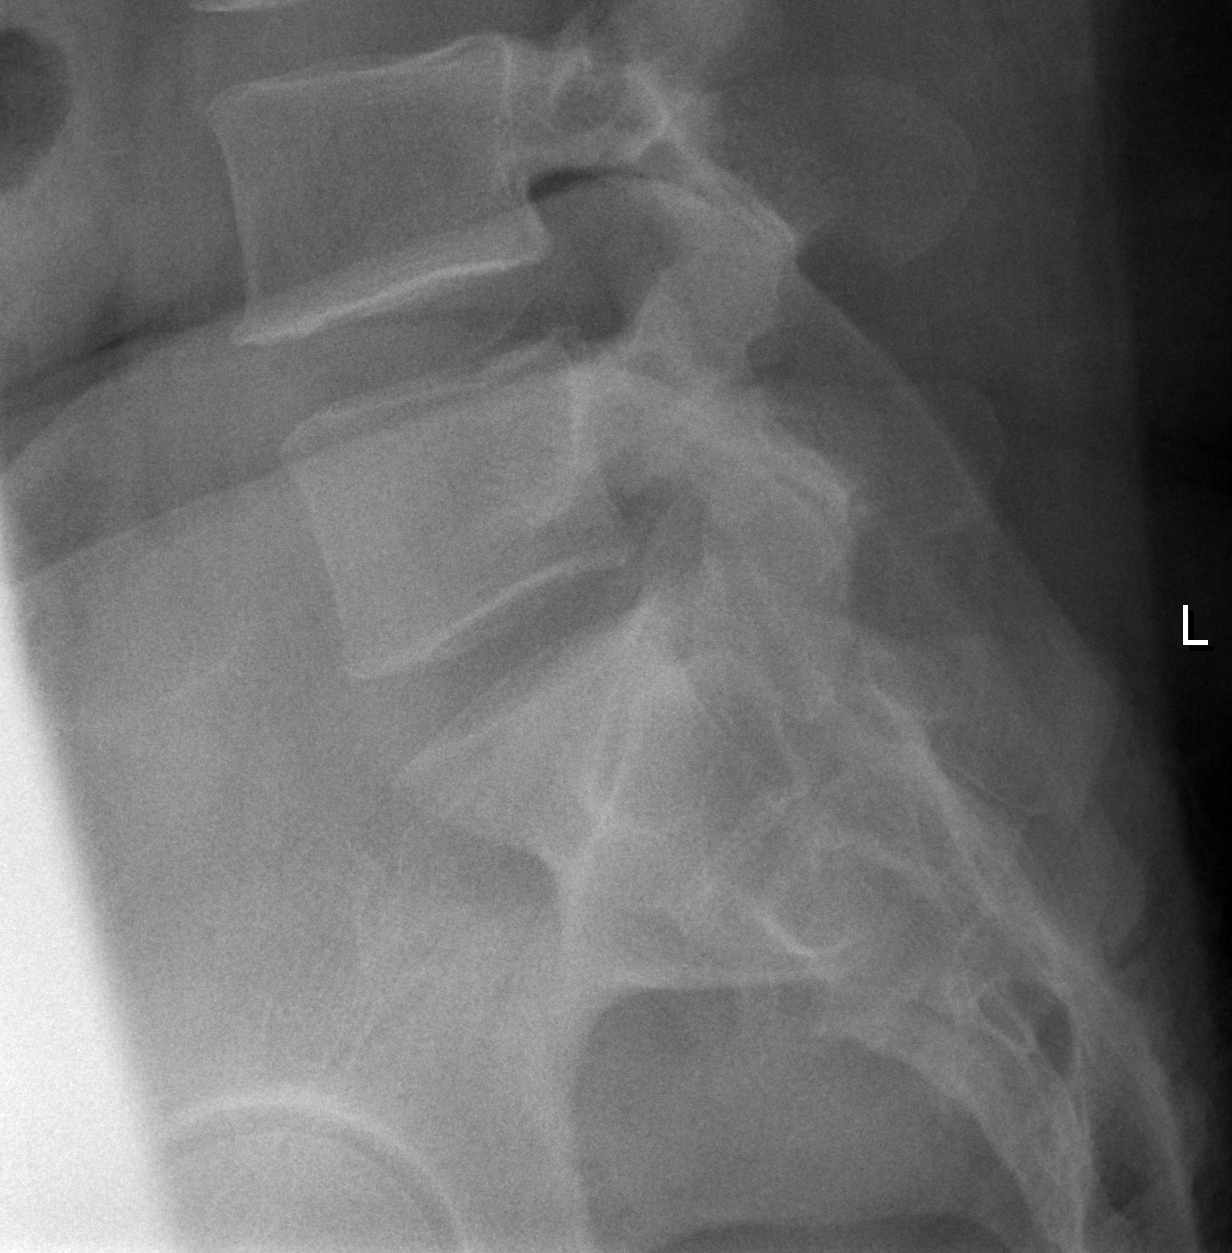

[5 of 5 positions shown; findings below may reference images not displayed]

FINDINGS: Normal visualized bowel gas pattern.  Normal lumbar segmentation, bone mineralization, vertebral body height and alignment.  No significant disc space narrowing.  No pars fracture identified.  The sacroiliac joints are within normal limits.  The visualized sacrum is intact.
IMPRESSION: Negative.  No acute osseous abnormality.

## 2009-09-02 ENCOUNTER — Inpatient Hospital Stay (HOSPITAL_COMMUNITY): Admission: EM | Admit: 2009-09-02 | Discharge: 2009-09-03 | Payer: Self-pay | Admitting: Emergency Medicine

## 2009-09-19 ENCOUNTER — Encounter: Payer: Self-pay | Admitting: Physician Assistant

## 2009-09-19 ENCOUNTER — Ambulatory Visit: Payer: Self-pay | Admitting: Internal Medicine

## 2009-09-19 DIAGNOSIS — D259 Leiomyoma of uterus, unspecified: Secondary | ICD-10-CM | POA: Insufficient documentation

## 2009-09-19 DIAGNOSIS — D509 Iron deficiency anemia, unspecified: Secondary | ICD-10-CM

## 2009-09-19 HISTORY — DX: Leiomyoma of uterus, unspecified: D25.9

## 2009-09-20 LAB — CONVERTED CEMR LAB
Basophils Absolute: 0.1 10*3/uL (ref 0.0–0.1)
Basophils Relative: 1 % (ref 0–1)
Lymphocytes Relative: 33 % (ref 12–46)
MCHC: 29.6 g/dL — ABNORMAL LOW (ref 30.0–36.0)
MCV: 73.2 fL — ABNORMAL LOW (ref 78.0–100.0)
Neutro Abs: 3.7 10*3/uL (ref 1.7–7.7)
Neutrophils Relative %: 50 % (ref 43–77)
Platelets: 326 10*3/uL (ref 150–400)

## 2009-09-26 ENCOUNTER — Ambulatory Visit: Payer: Self-pay | Admitting: Obstetrics & Gynecology

## 2009-12-07 IMAGING — CT CT ANGIO CHEST
2 of 6 series · 19 of 36 positions shown · IV contrast (APPLIED)
Comparison: None

CLINICAL DATA: Chest pain

 CT ANGIOGRAPHY CHEST
TECHNIQUE: Multidetector CT imaging of the chest using the
 standard protocol during bolus administration of intravenous
 contrast. Multiplanar CT image reconstructions were obtained to
 evaluate the vascular anatomy.
 Contrast: 100 ml Omnipaque

[Series 9: pulm embolism 2.0 cor · coronal · 0.63mm/px · 3 of 120 slices shown]
[im 24/120  mediastinal]
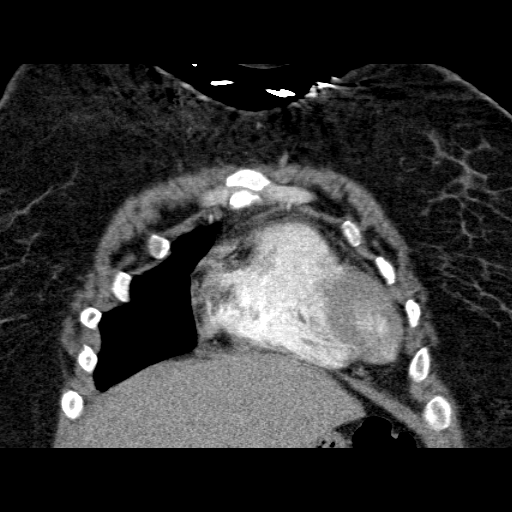
[im 48/120  mediastinal]
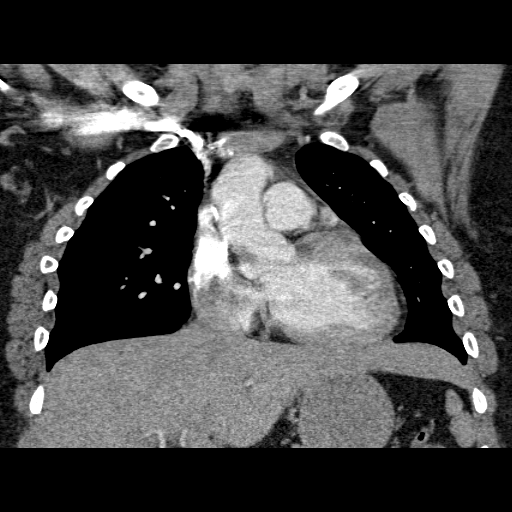
[im 72/120  mediastinal]
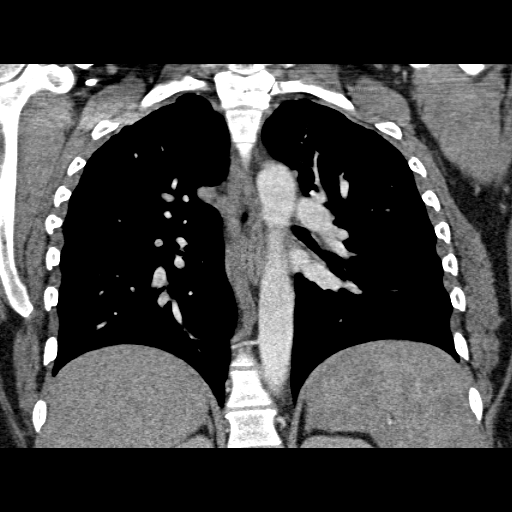

[Series 12: pulm embolism 1.0 thins · axial · 0.63mm/px · z∈[-312,-98]mm · 16 of 243 slices shown]
[im 14/243  lung]
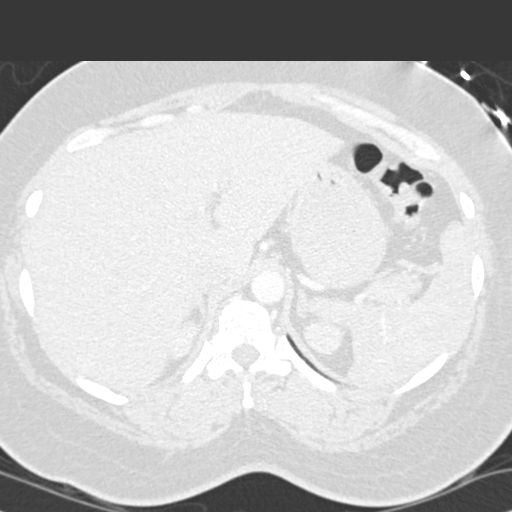
[im 27/243  mediastinal]
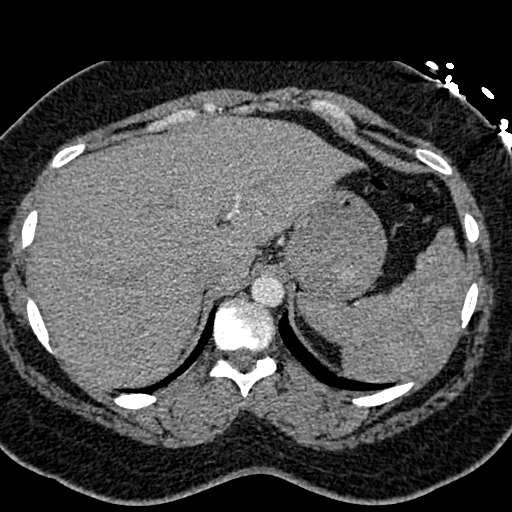
[im 41/243  lung]
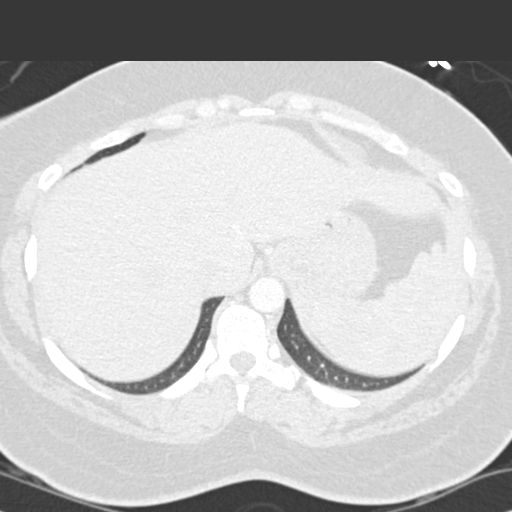
[im 54/243  mediastinal]
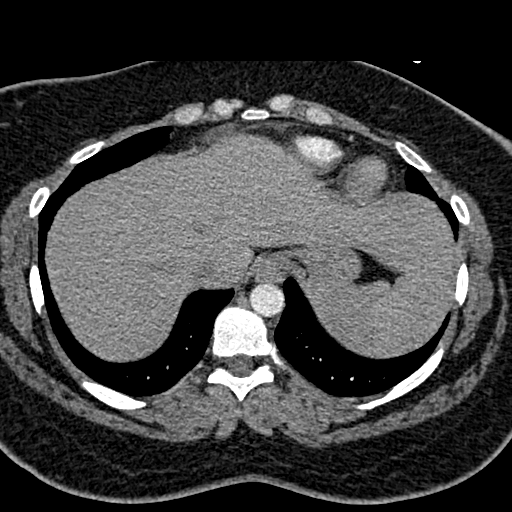
[im 68/243  lung]
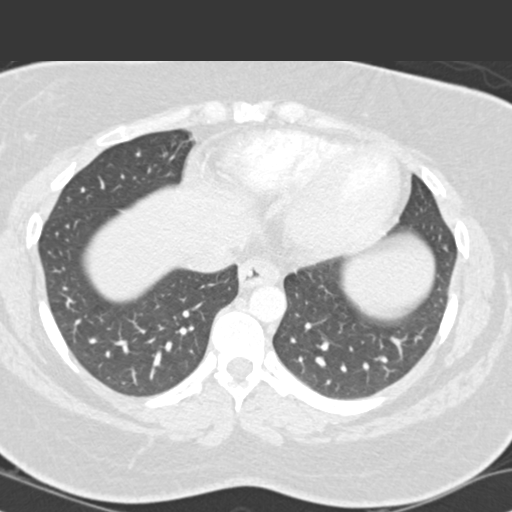
[im 81/243  mediastinal]
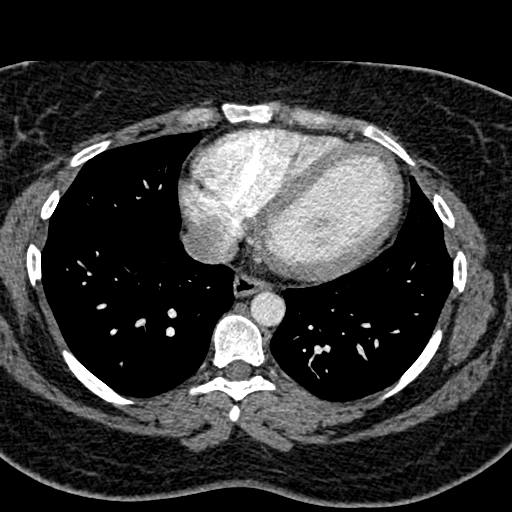
[im 95/243  lung]
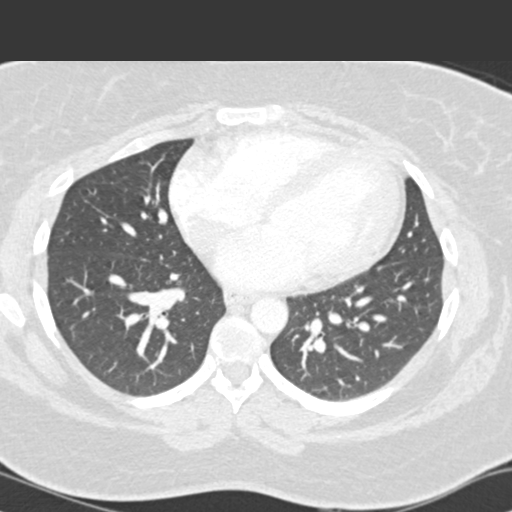
[im 108/243  mediastinal]
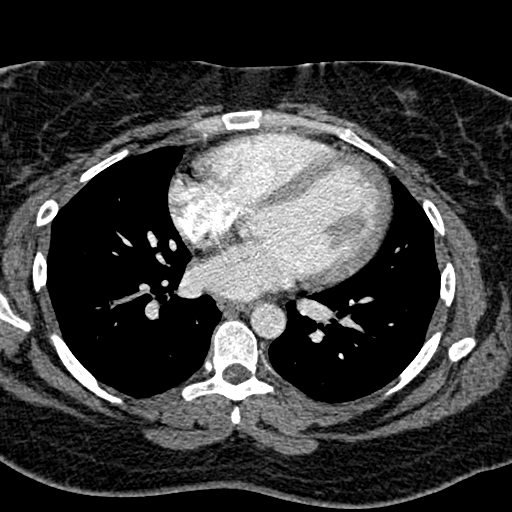
[im 135/243  lung]
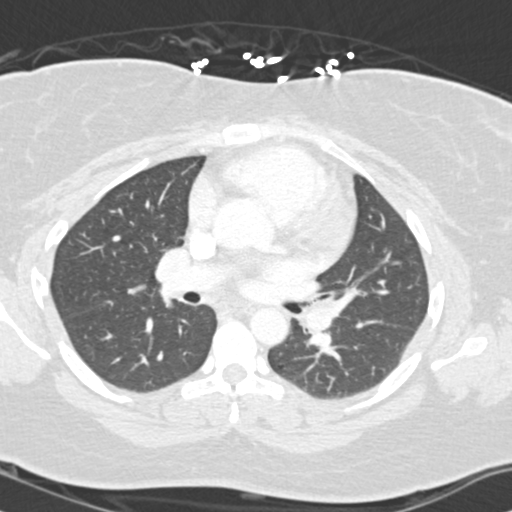
[im 148/243  mediastinal]
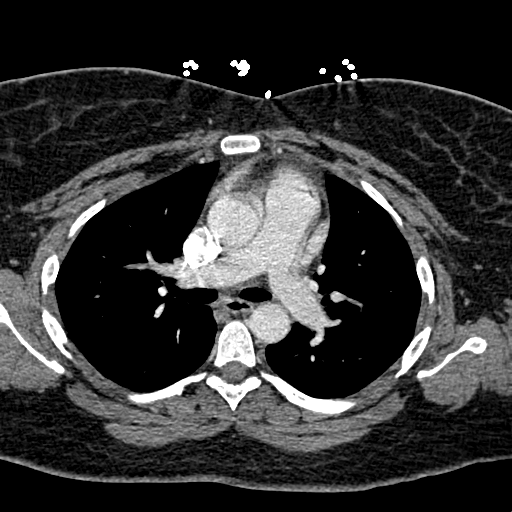
[im 162/243  lung]
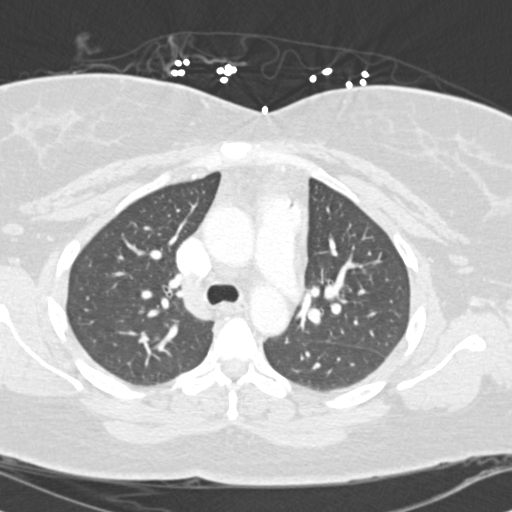
[im 175/243  mediastinal]
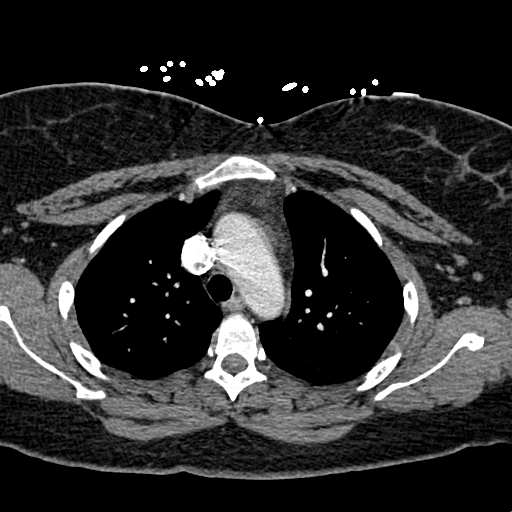
[im 189/243  lung]
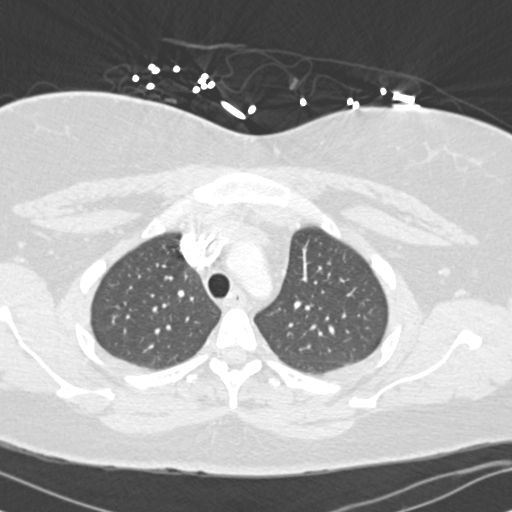
[im 202/243  mediastinal]
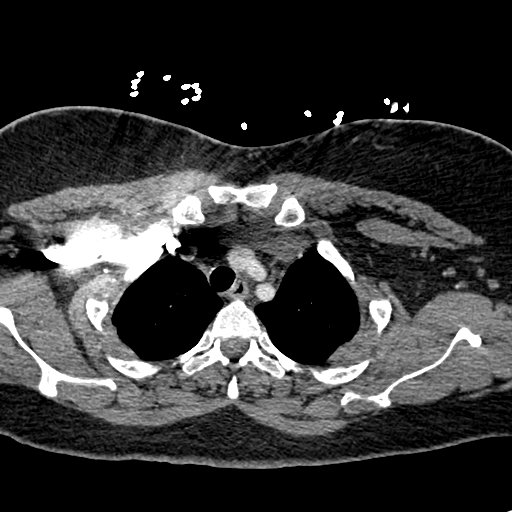
[im 216/243  lung]
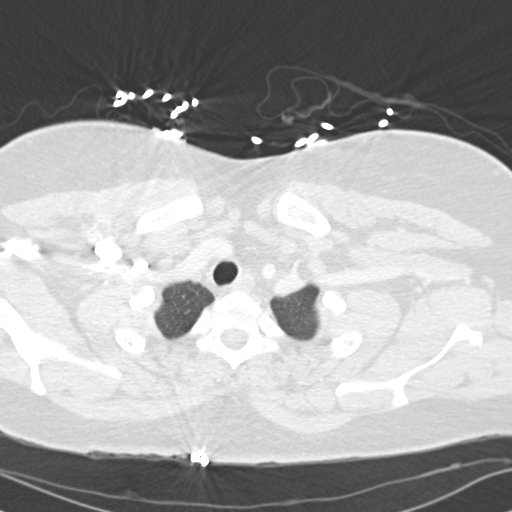
[im 229/243  mediastinal]
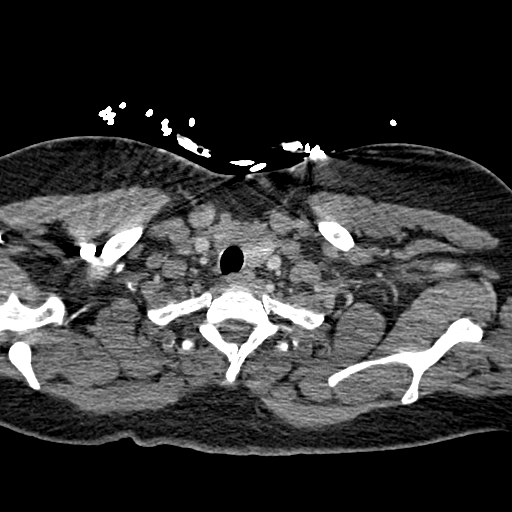

[19 of 36 positions shown; findings below may reference images not displayed]

FINDINGS: Study is technically limited in that the timing results
 in poor opacification of pulmonary arteries. There is no filling
 defects within the main pulmonary arteries and most proximal
 segmental pulmonary arteries to suggest acute pulmonary embolism.
 The distal segmental and subsegmental pulmonary arteries are poorly
 evaluated.

 The heart appears normal. Small amount of gas within the main
 pulmonary artery from venous injection.

 11 mm low density lesion in the left lobe of thyroid gland.

 No mediastinal lymphadenopathy.

 Lung windows demonstrate no evidence pneumothorax or focal
 consolidation. 4 mm density in the right middle lobe is felt to
 represent a vascular confluence. No suspicious pulmonary nodules.

 Limited view of the upper abdomen is unremarkable.

 Review of bone windows demonstrates no aggressive osseous lesions.
IMPRESSION: 1. No gross evidence of pulmonary embolism in technically limited
 study. Please see above.
 2. No acute pulmonary parenchymal abnormalities.
 3. An 11 mm hypodensity within the left of the thyroid gland.
 Consider ultrasound for baseline evaluation.

## 2009-12-07 IMAGING — CR DG CHEST 2V
2 series · 2 of 2 positions shown · non-contrast
Comparison: CT chest 11/21/2006

CLINICAL DATA: Chest pain

CHEST - 2 VIEW

[w chest pa]
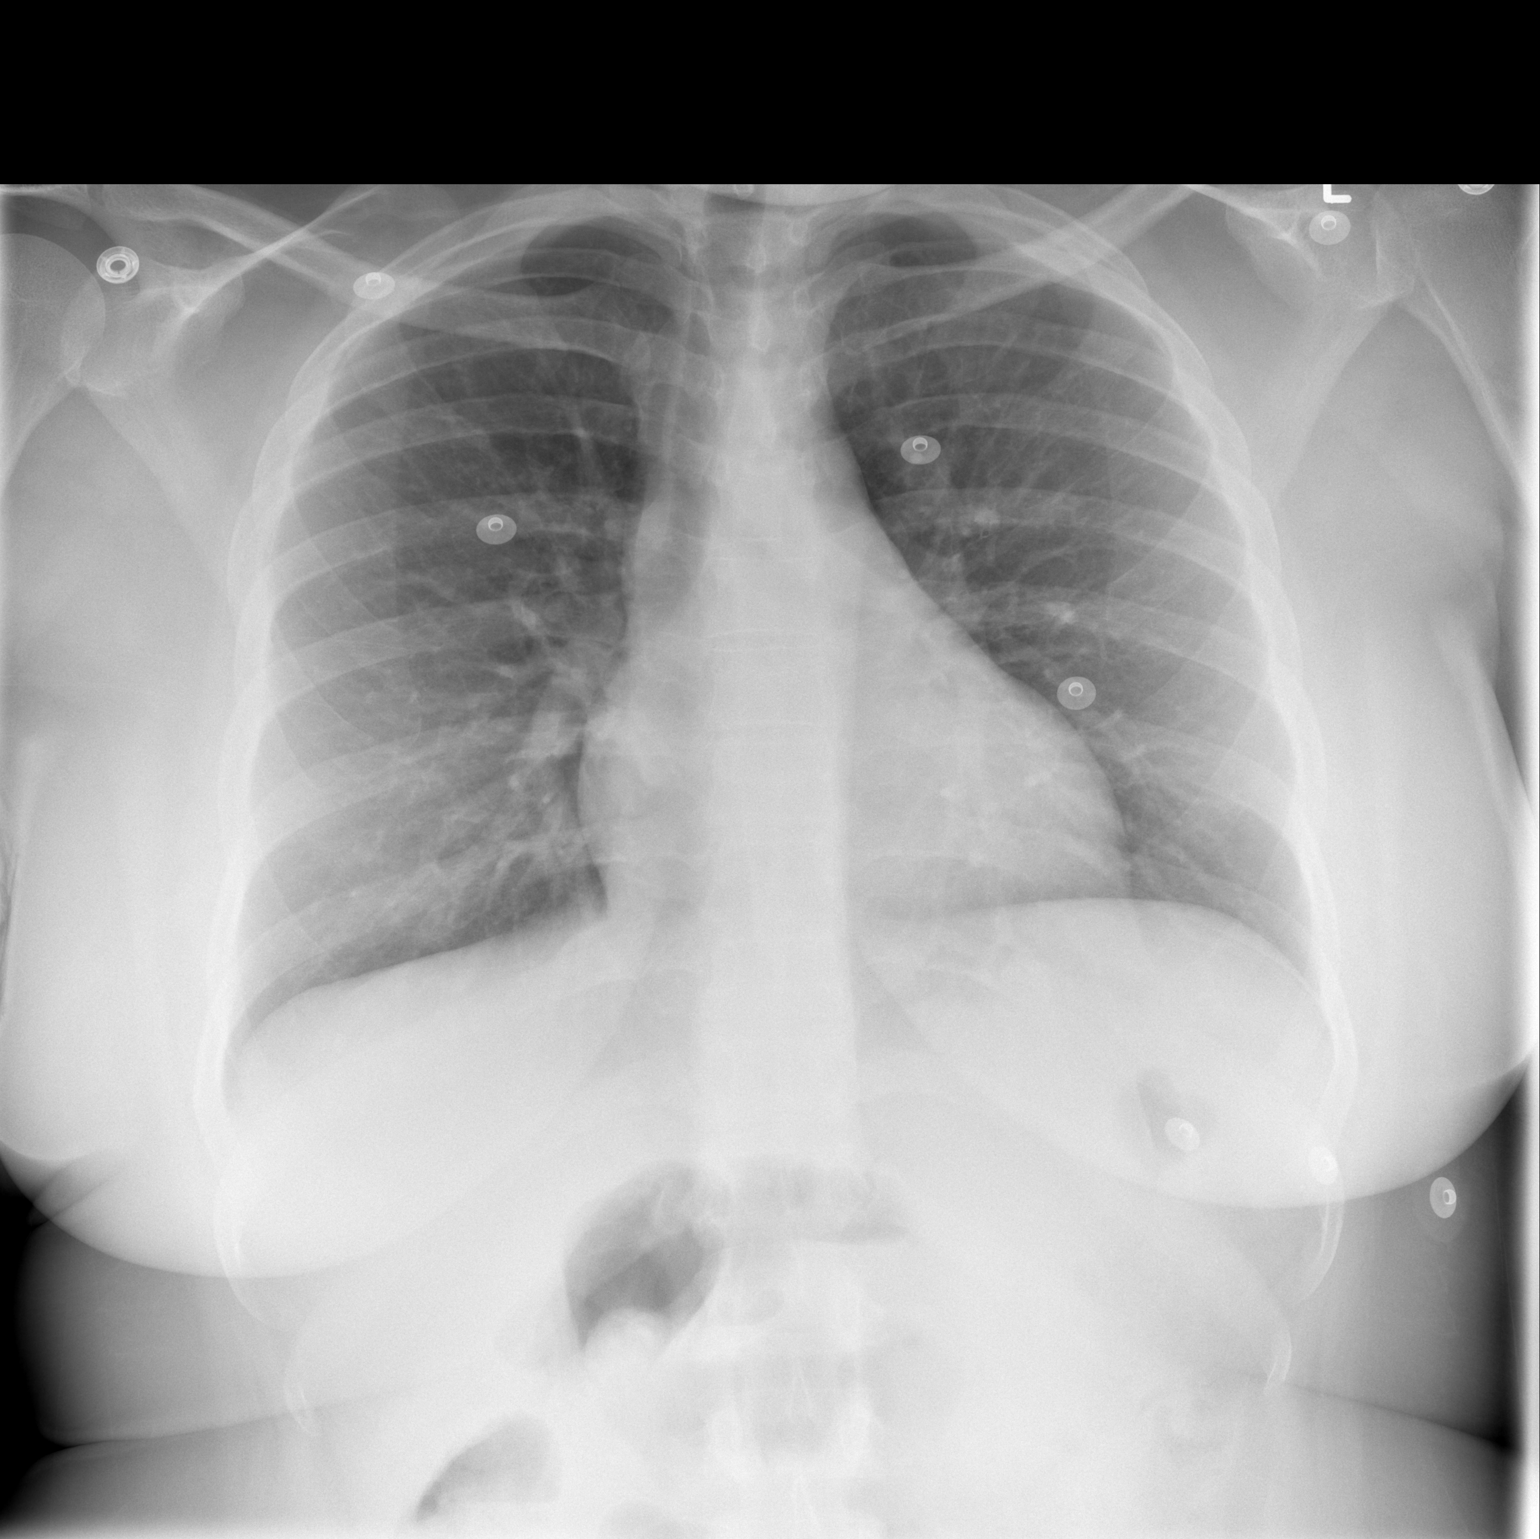

[w chest lat]
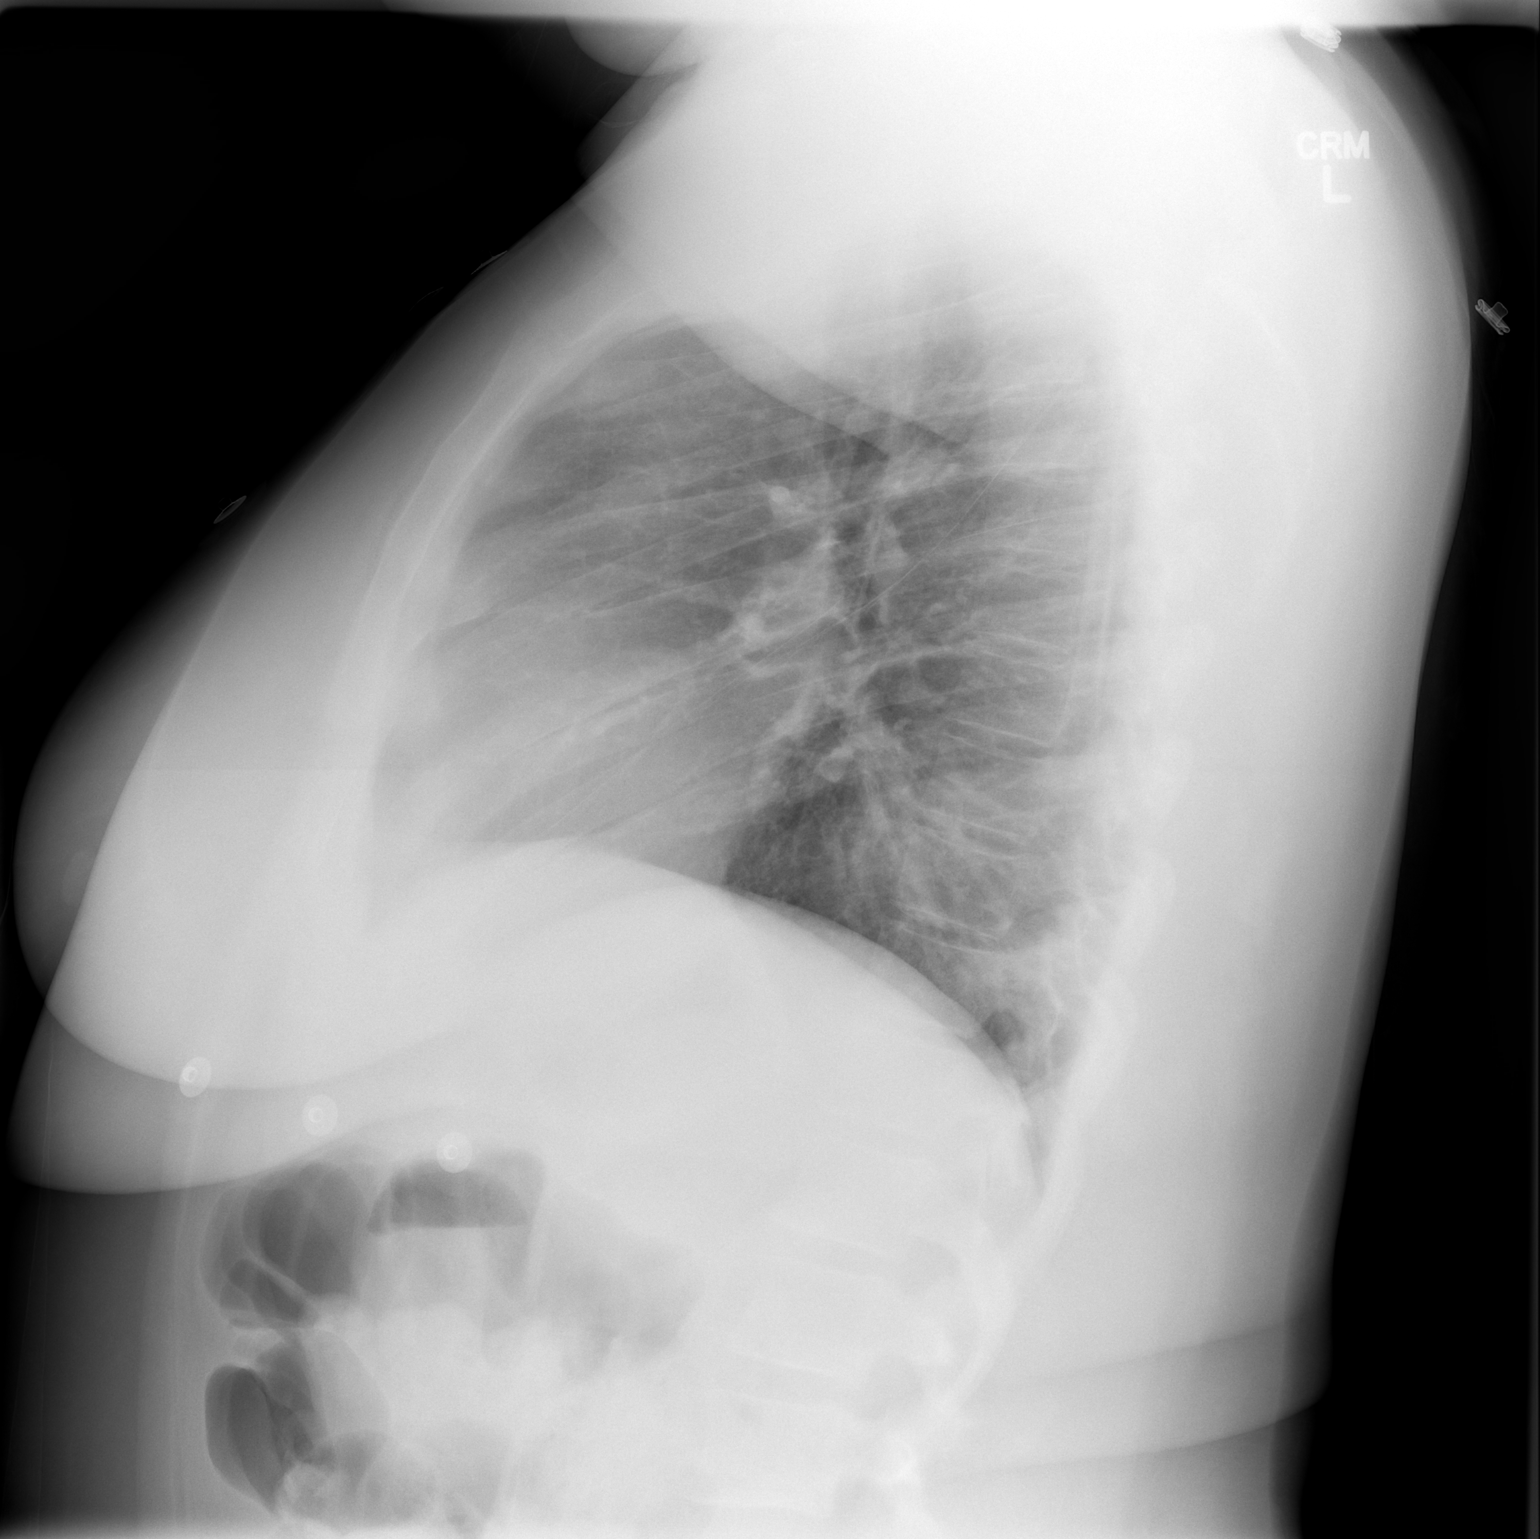

[2 of 2 positions shown; findings below may reference images not displayed]

FINDINGS: Normal mediastinum and cardiac silhouette.  Costophrenic
angles are clear.  No evidence effusion, infiltrate, or
pneumothorax.
IMPRESSION: No acute cardiopulmonary process.

## 2010-01-02 ENCOUNTER — Ambulatory Visit: Payer: Self-pay | Admitting: Obstetrics & Gynecology

## 2010-01-03 ENCOUNTER — Encounter (INDEPENDENT_AMBULATORY_CARE_PROVIDER_SITE_OTHER): Payer: Self-pay | Admitting: *Deleted

## 2010-01-03 LAB — CONVERTED CEMR LAB
Trich, Wet Prep: NONE SEEN
Yeast Wet Prep HPF POC: NONE SEEN

## 2010-02-08 ENCOUNTER — Encounter: Payer: Self-pay | Admitting: Emergency Medicine

## 2010-02-08 ENCOUNTER — Inpatient Hospital Stay (HOSPITAL_COMMUNITY)
Admission: AD | Admit: 2010-02-08 | Discharge: 2010-02-11 | Payer: Self-pay | Source: Home / Self Care | Admitting: Obstetrics & Gynecology

## 2010-02-09 ENCOUNTER — Encounter: Payer: Self-pay | Admitting: Obstetrics & Gynecology

## 2010-02-27 ENCOUNTER — Inpatient Hospital Stay (HOSPITAL_COMMUNITY)
Admission: AD | Admit: 2010-02-27 | Discharge: 2010-02-27 | Payer: Self-pay | Source: Home / Self Care | Attending: Obstetrics & Gynecology | Admitting: Obstetrics & Gynecology

## 2010-03-07 ENCOUNTER — Encounter (INDEPENDENT_AMBULATORY_CARE_PROVIDER_SITE_OTHER): Payer: Self-pay | Admitting: Internal Medicine

## 2010-03-19 ENCOUNTER — Ambulatory Visit
Admission: RE | Admit: 2010-03-19 | Discharge: 2010-03-19 | Payer: Self-pay | Source: Home / Self Care | Attending: Obstetrics & Gynecology | Admitting: Obstetrics & Gynecology

## 2010-04-10 ENCOUNTER — Ambulatory Visit: Admit: 2010-04-10 | Payer: Self-pay | Admitting: Nurse Practitioner

## 2010-04-15 NOTE — Assessment & Plan Note (Signed)
Summary: XFU--ABDOMINAL PAIN & ANEMIA//YC   Vital Signs:  Patient profile:   40 year old female Weight:      267 pounds Temp:     98.5 degrees F oral Pulse rate:   73 / minute Pulse rhythm:   regular Resp:     18 per minute BP sitting:   112 / 81  (left arm) Cuff size:   regular  Vitals Entered By: [place] Shannon (September 19, 2009 3:49 PM) CC:  abdominal pain on the left side, patient taking Percusets Is Patient Diabetic? No Pain Assessment Patient in pain? yes     Location: abdomen Intensity: 5 Type: throbbing  Does patient need assistance? Functional Status Self care Ambulation Normal   Primary Care Provider:  Tereso Newcomer, PA-C  CC:   abdominal pain on the left side and patient taking Percusets.  History of Present Illness: XFU.  Discharged from [facility] last month with Hgb of 6 in setting of menorrhagia.  She had abdominal pain and was given percocet.  She was given Txn with PRBCs and started on iron for iron deficiency anemia.  She was also started on OCPs.  She sees GYN next week at [facility].  No more bleeding.  More energy.  Has left sided abdominal pain with her cycles.    Habits & Providers  Alcohol-Tobacco-Diet     Tobacco Status: current  Exercise-Depression-Behavior     Have you felt down or hopeless? no     Have you felt little pleasure in things? no     Drug Use: no  Current Medications (verified): 1)  Norethindrone 0.35 Mg Tabs (Norethindrone) .Marland Kitchen.. 1 Pill By Mouth Per Day.  Allergies (verified): 1)  ! Penicillin 2)  ! Depo-Provera (Medroxyprogesterone Acetate)  Past History:  Past Medical History: Anemia-iron deficiency   a.  2/2 fibroid uterus   b.  on micronor and to f/u with GYN for poss TAH h/o gestational diabetes  Past Surgical History: Tubal ligation s/p cervical conization done for abnormal pap in past cardiac cath 07/08/2007: normal cors; normal LVF   a.  had chest pain; MI r/o; abnormal myoview  Family History: GM - hepatic  cancer Mom - CAD (MI at age 21 - deceased)  Social History: unemployed Single 4 kids Current Smoker (1/3 ppd x 7 years) Alcohol use-yes - occasional Drug use-no Smoking Status:  current Drug Use:  no  Physical Exam  General:  alert, well-developed, and well-nourished.   Head:  normocephalic and atraumatic.   Neck:  supple.   Lungs:  normal breath sounds.   Heart:  normal rate and regular rhythm.   Abdomen:  soft, non-tender, normal bowel sounds, and no hepatomegaly.   Extremities:  no edema  Neurologic:  alert & oriented X3 and cranial nerves II-XII intact.   Psych:  normally interactive.     Impression & Recommendations:  Problem # 1:  ANEMIA-IRON DEFICIENCY (ICD-280.9)  repeat CBC today continue iron  Her updated medication list for this problem includes:    Ferrous Gluconate 325 Mg Tabs (Ferrous gluconate) .Marland Kitchen... Take 1 tablet by mouth three times a day  Orders: T-CBC w/Diff ([id number])  Problem # 2:  FIBROIDS, UTERUS (ICD-218.9) f/u with GYN next week  Problem # 3:  PREVENTIVE HEALTH CARE (ICD-V70.0) schedule CPP if she does not have TAH   Complete Medication List: 1)  Norethindrone 0.35 Mg Tabs (Norethindrone) .Marland Kitchen.. 1 pill by mouth per day. 2)  Ferrous Gluconate 325 Mg Tabs (Ferrous gluconate) .Marland KitchenMarland KitchenMarland Kitchen  Take 1 tablet by mouth three times a day  Patient Instructions: 1)  I gave you a presription for the iron in case you did not have any refills.  Continue taking unless the gynecologist or I tell you to stop. 2)  Schedule lab visit in 3 months for CBC, Fasting Lipids. 3)  Schedule CPP with [facility] if you end up not having a hysterectomy or the gynecologist does not do a pap. Prescriptions: FERROUS GLUCONATE 325 MG TABS (FERROUS GLUCONATE) Take 1 tablet by mouth three times a day  #90 x 6   Entered and Authorized by:   Tereso Newcomer PA-C   Signed by:   Tereso Newcomer PA-C on 09/19/2009   Method used:   Print then Give to Patient   RxID:   [id number]

## 2010-04-15 NOTE — Letter (Signed)
Summary: PT INFORMATION SHEET  PT INFORMATION SHEET   Imported By: Arta Bruce 09/20/2009 10:35:20  _____________________________________________________________________  External Attachment:    Type:   Image     Comment:   External Document

## 2010-04-16 ENCOUNTER — Encounter: Payer: Self-pay | Admitting: Internal Medicine

## 2010-04-16 ENCOUNTER — Telehealth (INDEPENDENT_AMBULATORY_CARE_PROVIDER_SITE_OTHER): Payer: Self-pay | Admitting: Internal Medicine

## 2010-04-16 DIAGNOSIS — Z9189 Other specified personal risk factors, not elsewhere classified: Secondary | ICD-10-CM | POA: Insufficient documentation

## 2010-04-16 HISTORY — DX: Other specified personal risk factors, not elsewhere classified: Z91.89
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
## 2010-04-25 ENCOUNTER — Encounter (INDEPENDENT_AMBULATORY_CARE_PROVIDER_SITE_OTHER): Payer: Self-pay | Admitting: Internal Medicine

## 2010-04-25 ENCOUNTER — Encounter: Payer: Self-pay | Admitting: Internal Medicine

## 2010-04-25 DIAGNOSIS — H52209 Unspecified astigmatism, unspecified eye: Secondary | ICD-10-CM | POA: Insufficient documentation
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
## 2010-05-01 NOTE — Progress Notes (Signed)
Summary: DROPPED OFF DMV FORM  Phone Note Call from Patient Call back at Home Phone [phone]   Details for Reason: DMV FORM Summary of Call: Emmalee Solivan PT. MS [place] CAME BACK AND DROPPED OFF THE DMV FORM FOR YOU TO FILL OUT. Initial call taken by: Leodis Rains,  April 16, 2010 4:41 PM  Follow-up for Phone Call        Let pt. know there are many questions and physical parts I will need to see her for to fill out.  She needs another OV, including eye exam. I tried to call the DMV, but was on hold for a long period of time and could not get through to see why her license was revoked a year ago.   So we will need to go throught the exam and paperwork, including an eye exam when she comes back and see if that is enough for the DMV. Follow-up by: Julieanne Manson MD,  April 18, 2010 10:21 AM  Additional Follow-up for Phone Call Additional follow up Details #1::        LM on [phone] -- Need to sched. pt. an OV w/Dr. Delrae Alfred before we can complete DMV form  -- Hale Drone CMA  April 18, 2010 12:46 PM   Pt. called back -- sched. pt. appt. for 04/25/10 at 4:15 to complete DMV appt. -- Hale Drone CMA  April 21, 2010 8:42 AM

## 2010-05-07 NOTE — Letter (Signed)
Summary: DEPT OF TRANSPORTATION  DEPT OF TRANSPORTATION   Imported By: Arta Bruce 04/28/2010 15:12:26  _____________________________________________________________________  External Attachment:    Type:   Image     Comment:   External Document

## 2010-05-08 ENCOUNTER — Ambulatory Visit: Payer: Self-pay | Admitting: Obstetrics and Gynecology

## 2010-05-26 LAB — COMPREHENSIVE METABOLIC PANEL
Albumin: 3 g/dL — ABNORMAL LOW (ref 3.5–5.2)
Alkaline Phosphatase: 68 U/L (ref 39–117)
BUN: 6 mg/dL (ref 6–23)
Chloride: 106 mEq/L (ref 96–112)
Creatinine, Ser: 0.71 mg/dL (ref 0.4–1.2)
Glucose, Bld: 82 mg/dL (ref 70–99)
Potassium: 3.7 mEq/L (ref 3.5–5.1)
Total Bilirubin: 0.6 mg/dL (ref 0.3–1.2)
Total Protein: 7.2 g/dL (ref 6.0–8.3)

## 2010-05-26 LAB — CBC
HCT: 26.9 % — ABNORMAL LOW (ref 36.0–46.0)
MCH: 22 pg — ABNORMAL LOW (ref 26.0–34.0)
MCV: 73.9 fL — ABNORMAL LOW (ref 78.0–100.0)
Platelets: 400 10*3/uL (ref 150–400)
RDW: 19.9 % — ABNORMAL HIGH (ref 11.5–15.5)
WBC: 6.9 10*3/uL (ref 4.0–10.5)

## 2010-05-27 LAB — CROSSMATCH
Unit division: 0
Unit division: 0

## 2010-05-27 LAB — URINALYSIS, ROUTINE W REFLEX MICROSCOPIC
Nitrite: NEGATIVE
Protein, ur: NEGATIVE mg/dL
Specific Gravity, Urine: 1.012 (ref 1.005–1.030)
Urobilinogen, UA: 0.2 mg/dL (ref 0.0–1.0)

## 2010-05-27 LAB — CBC
HCT: 26.6 % — ABNORMAL LOW (ref 36.0–46.0)
Hemoglobin: 7.3 g/dL — ABNORMAL LOW (ref 12.0–15.0)
MCH: 23.6 pg — ABNORMAL LOW (ref 26.0–34.0)
MCHC: 30.9 g/dL (ref 30.0–36.0)
MCHC: 31.6 g/dL (ref 30.0–36.0)
MCV: 74.7 fL — ABNORMAL LOW (ref 78.0–100.0)
MCV: 75.2 fL — ABNORMAL LOW (ref 78.0–100.0)
Platelets: 369 10*3/uL (ref 150–400)
Platelets: 274 10*3/uL (ref 150–400)
Platelets: 294 10*3/uL (ref 150–400)
RBC: 3.77 MIL/uL — ABNORMAL LOW (ref 3.87–5.11)
RBC: 3.49 MIL/uL — ABNORMAL LOW (ref 3.87–5.11)
RBC: 4.08 MIL/uL (ref 3.87–5.11)
RDW: 16.3 % — ABNORMAL HIGH (ref 11.5–15.5)
RDW: 20.8 % — ABNORMAL HIGH (ref 11.5–15.5)
WBC: 8 10*3/uL (ref 4.0–10.5)
WBC: 5.8 10*3/uL (ref 4.0–10.5)

## 2010-05-27 LAB — COMPREHENSIVE METABOLIC PANEL
ALT: 8 U/L (ref 0–35)
AST: 17 U/L (ref 0–37)
Albumin: 3.1 g/dL — ABNORMAL LOW (ref 3.5–5.2)
CO2: 25 mEq/L (ref 19–32)
Chloride: 108 mEq/L (ref 96–112)
GFR calc Af Amer: >60 mL/min (ref >60)
GFR calc non Af Amer: >60 mL/min (ref >60)
Potassium: 4.2 mEq/L (ref 3.5–5.1)
Sodium: 137 mEq/L (ref 135–145)
Total Bilirubin: 0.2 mg/dL — ABNORMAL LOW (ref 0.3–1.2)

## 2010-05-27 LAB — BASIC METABOLIC PANEL
BUN: 4 mg/dL — ABNORMAL LOW (ref 6–23)
Creatinine, Ser: 0.71 mg/dL (ref 0.4–1.2)
GFR calc non Af Amer: >60 mL/min (ref >60)
Glucose, Bld: 107 mg/dL — ABNORMAL HIGH (ref 70–99)
Potassium: 4.4 mEq/L (ref 3.5–5.1)

## 2010-05-27 LAB — GC/CHLAMYDIA PROBE AMP, GENITAL: GC Probe Amp, Genital: NEGATIVE

## 2010-05-27 LAB — DIFFERENTIAL
Basophils Absolute: 0.1 10*3/uL (ref 0.0–0.1)
Eosinophils Relative: 10 % — ABNORMAL HIGH (ref 0–5)
Lymphocytes Relative: 39 % (ref 12–46)
Lymphs Abs: 3.1 10*3/uL (ref 0.7–4.0)
Neutro Abs: 3.2 10*3/uL (ref 1.7–7.7)
Neutrophils Relative %: 40 % — ABNORMAL LOW (ref 43–77)

## 2010-05-27 LAB — WET PREP, GENITAL: Clue Cells Wet Prep HPF POC: NONE SEEN

## 2010-05-27 LAB — POCT I-STAT, CHEM 8
BUN: 8 mg/dL (ref 6–23)
Chloride: 104 mEq/L (ref 96–112)
Creatinine, Ser: 0.8 mg/dL (ref 0.4–1.2)
Potassium: 3.5 mEq/L (ref 3.5–5.1)
Sodium: 141 mEq/L (ref 135–145)
TCO2: 25 mmol/L (ref 0–100)

## 2010-05-27 LAB — GLUCOSE, CAPILLARY: Glucose-Capillary: 117 mg/dL — ABNORMAL HIGH (ref 70–99)

## 2010-05-27 LAB — POCT PREGNANCY, URINE: Preg Test, Ur: NEGATIVE

## 2010-05-27 LAB — CA 125: CA 125: 14 U/mL (ref 0.0–30.2)

## 2010-06-01 LAB — TYPE AND SCREEN: ABO/RH(D): O POS

## 2010-06-01 LAB — DIFFERENTIAL
Basophils Relative: 8 % — ABNORMAL HIGH (ref 0–1)
Eosinophils Absolute: 0.7 10*3/uL (ref 0.0–0.7)
Lymphs Abs: 2.3 10*3/uL (ref 0.7–4.0)
Monocytes Absolute: 0.7 10*3/uL (ref 0.1–1.0)
Neutro Abs: 3.7 10*3/uL (ref 1.7–7.7)

## 2010-06-01 LAB — IRON AND TIBC
Iron: 10 ug/dL — ABNORMAL LOW (ref 42–135)
UIBC: 528 ug/dL

## 2010-06-01 LAB — CBC
HCT: 21 % — ABNORMAL LOW (ref 36.0–46.0)
HCT: 27.8 % — ABNORMAL LOW (ref 36.0–46.0)
Hemoglobin: 6 g/dL — CL (ref 12.0–15.0)
Hemoglobin: 6.9 g/dL — CL (ref 12.0–15.0)
MCHC: 28.5 g/dL — ABNORMAL LOW (ref 30.0–36.0)
MCHC: 31 g/dL (ref 30.0–36.0)
MCV: 57.3 fL — ABNORMAL LOW (ref 78.0–100.0)
MCV: 66.6 fL — ABNORMAL LOW (ref 78.0–100.0)
Platelets: 311 10*3/uL (ref 150–400)
RBC: 3.66 MIL/uL — ABNORMAL LOW (ref 3.87–5.11)
RDW: 26.3 % — ABNORMAL HIGH (ref 11.5–15.5)
RDW: 33.5 % — ABNORMAL HIGH (ref 11.5–15.5)

## 2010-06-01 LAB — WET PREP, GENITAL
Trich, Wet Prep: NONE SEEN
Yeast Wet Prep HPF POC: NONE SEEN

## 2010-06-01 LAB — BASIC METABOLIC PANEL
BUN: 8 mg/dL (ref 6–23)
CO2: 24 mEq/L (ref 19–32)
Chloride: 107 mEq/L (ref 96–112)
Chloride: 107 mEq/L (ref 96–112)
GFR calc Af Amer: >60 mL/min (ref >60)
Glucose, Bld: 99 mg/dL (ref 70–99)
Potassium: 3.4 mEq/L — ABNORMAL LOW (ref 3.5–5.1)
Sodium: 137 mEq/L (ref 135–145)

## 2010-06-01 LAB — URINALYSIS, ROUTINE W REFLEX MICROSCOPIC
Bilirubin Urine: NEGATIVE
Glucose, UA: NEGATIVE mg/dL
Ketones, ur: 15 mg/dL — AB
Leukocytes, UA: NEGATIVE
Nitrite: NEGATIVE
Protein, ur: 100 mg/dL — AB
Urobilinogen, UA: 1 mg/dL (ref 0.0–1.0)
pH: 6.5 (ref 5.0–8.0)

## 2010-06-01 LAB — PROTIME-INR: Prothrombin Time: 14.8 seconds (ref 11.6–15.2)

## 2010-06-01 LAB — FERRITIN: Ferritin: 3 ng/mL — ABNORMAL LOW (ref 10–291)

## 2010-06-01 LAB — VON WILLEBRAND ANTIGEN: Von Willebrand Antigen, Plasma: 203 % (ref 50–217)

## 2010-06-01 LAB — URINE MICROSCOPIC-ADD ON

## 2010-06-01 LAB — HEMOGLOBIN AND HEMATOCRIT, BLOOD
HCT: 23.8 % — ABNORMAL LOW (ref 36.0–46.0)
HCT: 24.7 % — ABNORMAL LOW (ref 36.0–46.0)

## 2010-06-18 LAB — URINALYSIS, ROUTINE W REFLEX MICROSCOPIC
Ketones, ur: NEGATIVE mg/dL
Nitrite: NEGATIVE
Protein, ur: NEGATIVE mg/dL
Urobilinogen, UA: 0.2 mg/dL (ref 0.0–1.0)

## 2010-06-18 LAB — CBC
MCHC: 28.8 g/dL — ABNORMAL LOW (ref 30.0–36.0)
MCV: 59.9 fL — ABNORMAL LOW (ref 78.0–100.0)
Platelets: 314 10*3/uL (ref 150–400)
RDW: 20.3 % — ABNORMAL HIGH (ref 11.5–15.5)
WBC: 7 10*3/uL (ref 4.0–10.5)

## 2010-06-22 LAB — DIFFERENTIAL
Basophils Absolute: 0.2 10*3/uL — ABNORMAL HIGH (ref 0.0–0.1)
Eosinophils Absolute: 1.1 10*3/uL — ABNORMAL HIGH (ref 0.0–0.7)
Lymphocytes Relative: 37 % (ref 12–46)
Monocytes Absolute: 0.6 10*3/uL (ref 0.1–1.0)
Neutro Abs: 2.8 10*3/uL (ref 1.7–7.7)
Neutrophils Relative %: 37 % — ABNORMAL LOW (ref 43–77)

## 2010-06-22 LAB — BASIC METABOLIC PANEL
BUN: 6 mg/dL (ref 6–23)
Calcium: 9.5 mg/dL (ref 8.4–10.5)
GFR calc non Af Amer: >60 mL/min (ref >60)
Glucose, Bld: 93 mg/dL (ref 70–99)

## 2010-06-22 LAB — CBC
HCT: 25.8 % — ABNORMAL LOW (ref 36.0–46.0)
MCHC: 29.1 g/dL — ABNORMAL LOW (ref 30.0–36.0)
Platelets: 333 10*3/uL (ref 150–400)
RDW: 19.2 % — ABNORMAL HIGH (ref 11.5–15.5)

## 2010-06-22 LAB — POCT CARDIAC MARKERS: CKMB, poc: <1 ng/mL — ABNORMAL LOW (ref 1.0–8.0)

## 2010-06-25 LAB — DIFFERENTIAL
Basophils Relative: 2 % — ABNORMAL HIGH (ref 0–1)
Eosinophils Relative: 9 % — ABNORMAL HIGH (ref 0–5)
Lymphocytes Relative: 28 % (ref 12–46)
Monocytes Absolute: 0.6 10*3/uL (ref 0.1–1.0)
Monocytes Relative: 8 % (ref 3–12)
Neutrophils Relative %: 53 % (ref 43–77)

## 2010-06-25 LAB — HEMOCCULT GUIAC POC 1CARD (OFFICE): Fecal Occult Bld: NEGATIVE

## 2010-06-25 LAB — CBC
Platelets: 325 10*3/uL (ref 150–400)
RBC: 3.9 MIL/uL (ref 3.87–5.11)
WBC: 7.5 10*3/uL (ref 4.0–10.5)

## 2010-06-25 LAB — URINALYSIS, ROUTINE W REFLEX MICROSCOPIC
Bilirubin Urine: NEGATIVE
Glucose, UA: NEGATIVE mg/dL
Hgb urine dipstick: NEGATIVE
Specific Gravity, Urine: 1.021 (ref 1.005–1.030)
Urobilinogen, UA: 1 mg/dL (ref 0.0–1.0)

## 2010-06-25 LAB — POCT I-STAT, CHEM 8
BUN: 12 mg/dL (ref 6–23)
Calcium, Ion: 1.26 mmol/L (ref 1.12–1.32)
HCT: 30 % — ABNORMAL LOW (ref 36.0–46.0)
Hemoglobin: 10.2 g/dL — ABNORMAL LOW (ref 12.0–15.0)
Sodium: 139 mEq/L (ref 135–145)
TCO2: 19 mmol/L (ref 0–100)

## 2010-06-25 LAB — URINE MICROSCOPIC-ADD ON

## 2010-06-25 LAB — HEPATIC FUNCTION PANEL
AST: 19 U/L (ref 0–37)
Albumin: 3.6 g/dL (ref 3.5–5.2)
Bilirubin, Direct: <0.1 mg/dL (ref 0.0–0.3)
Total Bilirubin: 0.4 mg/dL (ref 0.3–1.2)

## 2010-06-25 LAB — PREGNANCY, URINE: Preg Test, Ur: NEGATIVE

## 2010-06-25 LAB — LIPASE, BLOOD: Lipase: 24 U/L (ref 11–59)

## 2010-07-29 NOTE — Cardiovascular Report (Signed)
NAMEMYHA, [name]                  ACCOUNT NO.:  [id number]   MEDICAL RECORD NO.:  [id number]          PATIENT TYPE:  INP   LOCATION:  4714                         FACILITY:  [facility]   PHYSICIAN:  Noralyn Pick. Eden Emms, MD, FACCDATE OF BIRTH:  Nov 09, 1970   DATE OF PROCEDURE:  DATE OF DISCHARGE:                            CARDIAC CATHETERIZATION   A 40 year old patient with chest pain.  The patient also is noted to  have significant anemia.  Cine catheterization was done with 5-French  catheter from right femoral artery.   Note should be made that the patient's femoral artery was extremely  deep; however, we had no difficulty cannulating and use the exchange  wires throughout the case.   Left main coronary artery was normal.   Left anterior descending artery was large.  There was three diagonal  branches and the distal LAD wrapped the apex.  There is no significant  disease.   The circumflex coronary artery was medium-sized vessel.  The circumflex  had one large obtuse marginal branch, second smaller obtuse marginal  branch, and an AV groove branch.   The right coronary artery was medium-sized vessel.  There really was not  much of a PDA as the LAD wrapped the apex.  The right coronary artery  was normal.   RAO ventriculography:  RAO ventriculography was normal, EF of 60%.  There was no gradient across the aortic valve and no MR.  LV pressure is  110/7 with a post A-wave EDP of 11.  Aortic pressure was 110/61.   IMPRESSION:  The patient has no significant coronary artery disease.  She will be watched closely.  She has a deep femoral artery and is  already significantly anemic.  We will check her hemoglobin in the  morning.   She tolerated the procedure well.      Noralyn Pick. Eden Emms, MD, [facility]  Electronically Signed     PCN/MEDQ  D:  07/08/2007  T:  07/09/2007  Job:  [id number]

## 2010-07-29 NOTE — Consult Note (Signed)
NAMETESSICA, [name]                  ACCOUNT NO.:  [id number]   MEDICAL RECORD NO.:  [id number]          PATIENT TYPE:  INP   LOCATION:  4714                         FACILITY:  [facility]   PHYSICIAN:  Rose Phi. Myna Hidalgo, M.D. DATE OF BIRTH:  April 21, 1970   DATE OF CONSULTATION:  DATE OF DISCHARGE:                                 CONSULTATION   REFERRING PHYSICIAN:  Gerrit Friends. Dietrich Pates, M.D., [facility]   REASON FOR CONSULTATION:  Microcytic anemia - iron deficiency.   HISTORY OF PRESENT ILLNESS:  Ms. [name] is a very nice 40 year old  African-American female, who came in with chest pain.  She came in on  the morning of July 06, 2007.  She had an argument with her son.   She is having nausea.  She has had a lot of problems with  menometrorrhagia.  She has uterine fibroids.  She does not have a  gynecologist.  She was told back a year or so ago that she needed a  hysterectomy.   When she was admitted, she was found to have severe microcytic anemia.  Her CBC showed white cell count of 7.5, hemoglobin 7, hematocrit 23.8,  and platelet count was 374,000.  MCV was 64.   She had iron studies done.  Iron studies showed iron less than 10.  Her  iron saturation was only 3%.  Her reticulocyte count was pretty much  nonexistent.  She had fecal occult blood, but that was negative.   We are asked to see her now to help with her iron deficiency management.  The patient said that she had been on oral iron 3-4 months ago, but only  took taken them for a month when she ran out of them.   She has had a lot of craving for ice.  She has had no melena.  There has  been no bright red blood per rectum.   PAST MEDICAL HISTORY:  1. Remarkable for fibromyalgia.  2. History of cervical cancer.  3. Uterine fibroids.  4. History of pyelonephritis.   ALLERGIES:  PENICILLIN.   CURRENT MEDICATIONS:  Her admission medications were multivitamins.   SOCIAL HISTORY:  Negative for tobacco use.  There is no alcohol use.  She stopped smoking 1 year ago.  She is unemployed.   FAMILY HISTORY:  Remarkable for CHF.   REVIEW OF SYSTEMS:  As stated in history of present illness.   PHYSICAL EXAMINATION:  GENERAL:  This is an obese black female in no  obvious distress.  VITAL SIGNS:  Show temperature 97.3. pulse 79, respiratory rate 18,  blood pressure 115/76, and weight is 100 pounds.  HEENT:  Shows normocephalic atraumatic.  Conjunctivae are very pale.  No  oral lesions.  There is no glossitis.  NECK:  Supple.  No adenopathy.  Thyroid is not palpable.  LUNGS:  Clear bilaterally.  CARDIAC EXAM:  Regular rate and rhythm with normal S1 and S2.  She has a  1/6 systolic ejection murmur.  ABDOMINAL EXAM:  Obese and soft.  She has good bowel sounds.  There is  no palpable abdominal  mass.  No palpable hepatosplenomegaly.  BACK EXAM:  No tenderness of the spine, ribs, or hips.  EXTREMITIES:  Shows no clubbing, cyanosis, or edema.   LABORATORY DATA:  Her blood smear shows moderate microcytic and  hypochromic red blood cells.  She has no nuclear red blood cells.  There  is no rouleaux formation.  I see no teardrop cells.  There are no target  cells.  White cells appear normal with slight maturation.  Platelets are  slightly increased in number.  She has a few large platelets.   IMPRESSION:  Ms. [name] is a 40 year old black female with microcytic  anemia.  Her MCV is quite low.  Her iron studies are very consistent  with iron-deficiency anemia.   She certainly needs IV iron.  Unfortunately, the brand that we use in  which we give back all the iron in 1 day is not available for another  month or so.   I had to go and give her for Venofer.  The problem with this is that we  cannot give at all at one time.  It has to be given once or twice a  week.   We will get her on oral iron.  I think oral iron is going to be  important for her.   Also she is going to need to have the fibroids taken care of.  Whether  this  is via ablation or hysterectomy, we will leave that up to the  gynecologist.   I do not see the need for bone marrow test with Ms. [name].  I do not see  the need for any additional blood tests from a workup point of view as  iron deficiency is clearly the problem here.   We will follow Ms. [name] along as needed.   Thanks so much for the referral.  Ms. [name] is a very nice woman.  We  had good fellowship.      Rose Phi. Myna Hidalgo, M.D.  Electronically Signed     PRE/MEDQ  D:  07/06/2007  T:  07/07/2007  Job:  [id number]

## 2010-07-29 NOTE — H&P (Signed)
[name], Brown                  ACCOUNT NO.:  [id number]   MEDICAL RECORD NO.:  [id number]          PATIENT TYPE:  INP   LOCATION:  4714                         FACILITY:  [facility]   PHYSICIAN:  Reginia Forts, MD     DATE OF BIRTH:  01/21/1971   DATE OF ADMISSION:  07/06/2007  DATE OF DISCHARGE:                              HISTORY & PHYSICAL   CHIEF COMPLAINT:  Chest pain.   ATTENDING PHYSICIAN:  Dr. Dietrich Pates with [facility].   [name] is a 40 year old African American woman with no prior cardiac  history who presents with an episode of chest pain after an argument  with her son.  The patient has had multiple episodes of chest pain  during emotional stress event.  She had one syncopal episode during an  argument 1 year ago.  The patient denies any exertional chest pain or  shortness of breath.  All her events have occurred during emotional  stressors.  The pain is a pressure sensation in her left upper chest  that radiates to her neck and resolves with resolution of the stressor.  She denies any orthopnea or paroxysmal nocturnal dyspnea.   PAST MEDICAL HISTORY:  1. Fibromyalgia.  The patient's baseline pain is a band-like sensation      across the upper abdomen.  2. History of cervical cancer, status post resection.  3. Anemia with hematocrit ranging from 20-25.  4. Uterine fibroids discovered in May 2008.  5. History of pyelonephritis.   ALLERGIES:  To PENICILLIN causing a rash.   MEDICATIONS:  Multivitamin.   PAST SURGICAL HISTORY:  Tubal ligation and partial hysterectomy in 1996.   SOCIAL HISTORY:  The patient lives in [place] with her son.  She is  unemployed.  She has smoked 2-packs a day for 23 years, but quit 1 year  ago.  She notes occasional alcohol use.  Denies any drug use.   FAMILY HISTORY:  Notable for mother with congestive heart failure in her  6s.   REVIEW OF SYSTEMS:  Notable for abdominal pain as noted via the  fibromyalgia symptoms.   The rest of the 12-review of systems was  reviewed and is negative except for HPI.   PHYSICAL EXAMINATION:  VITAL SIGNS:  Temperature 97.4, pulse 85,  respiratory rate 16, blood pressure 135/77, sating 98% on 2L.  GENERAL:  In general, the patient is awake, alert and oriented x3, and  in no acute distress.  HEENT:  Normocephalic and atraumatic.  Pupils equal, round, and reactive  to light.  Extraocular movements are intact.  NECK:  Shows no JVD, no carotid bruits.  CARDIOVASCULAR:  Regular and rhythm. Normal rate.  No murmurs, rubs, or  gallops.  LUNGS:  Clear to auscultation bilaterally.  ABDOMEN:  Positive bowels sounds, obese.  Soft, nontender, and  nondistended.  EXTREMITIES:  Show no cyanosis, clubbing, or edema.  2+ femoral pulses,  2+ distal pulses.  MUSCULOSKELETAL:  No joint effusions or tenderness.  NEUROLOGIC:  Cranial nerves II through XII are grossly intact.  No focal  musculoskeletal or sensory deficit.  LYMPH:  Demonstrates no lymphadenopathy.   Chest x-ray demonstrates no acute cardiopulmonary process.  EKG  demonstrates normal sinus rhythm with a heart rate of 81 with no ST-T  wave changes.   LABORATORY DATA:  Demonstrates a white count of 7.5, hemoglobin of 7.1,  and platelet count 374,000.  BUN of 6, creatinine of 0.9, troponin of  0.13, CK is 39.5, MB is 3.4, and MCV is 65.   ASSESSMENT AND PLAN:  This is a 40 year old African American woman with  a chest pain secondary to emotional stress with an abnormal troponin.  1. Rule out myocardial infarction.  The patient will be continued on      heparin and nitroglycerin that was started in the emergency room,      enzymes will be cycled.  The patient will be started on beta      blocker and a statin.  Further intervention will be determined by      the trend of the troponin elevation.  2. Anemia.  This appears to be chronically low, but stable.      Reginia Forts, MD  Electronically Signed     RA/MEDQ   D:  07/06/2007  T:  07/06/2007  Job:  [id number]

## 2010-07-29 NOTE — Discharge Summary (Signed)
[name], [name]                  ACCOUNT NO.:  [id number]   MEDICAL RECORD NO.:  [id number]          PATIENT TYPE:  INP   LOCATION:  4714                         FACILITY:  [facility]   PHYSICIAN:  Luis Abed, MD, FACCDATE OF BIRTH:  07-Mar-1971   DATE OF ADMISSION:  07/06/2007  DATE OF DISCHARGE:  07/09/2007                               DISCHARGE SUMMARY   PRIMARY CARDIOLOGIST:  Theron Arista C. Eden Emms, MD, [facility].   HEMATOLOGY CONSULTANT:  Rose Phi. Myna Hidalgo, M.D.   GYNECOLOGY CONSULTANT:  Timothy P. Fontaine, M.D.   DISCHARGE DIAGNOSIS:  Chest pain.   SECONDARY DIAGNOSES:  1. Iron deficiency anemia.  2. Fibromyalgia.  3. History of cervical cancer, status post resection.  4. Uterine fibroids diagnosed May 2008.  5. History of pyelonephritis.   ALLERGIES:  PENICILLIN CAUSES RASH.   PROCEDURES:  Lexiscan Myoview and left heart cardiac catheterization.   HISTORY OF PRESENT ILLNESS:  A 40 year old African American female  without prior cardiac history presented to the [facility] ED July 06, 2007, with complaints of chest discomfort that occurred during a period  of emotional stress when arguing with her son.  In the ED, EKG showed no  acute changes, and her troponin was elevated at 0.13 with normal CKs and  MBs.  Of note, she was significantly anemic with a hemoglobin of 7.1.  She was admitted for further evaluation and management.   HOSPITAL COURSE:  Despite an elevated point-of-care marker in the ED,  subsequent markers were negative x3.  Decision was made to pursue an  inpatient  Lexiscan Myoview; however, in the setting of significant  microcytic anemia, hematology was consulted first.  The patient was seen  by Dr. Arlan Organ, who felt that her lab work and blood smear were  most consistent with iron-deficiency anemia.  The patient was then  treated with intravenous iron, placed on Chromagen Forte b.i.d.  With a  history of uterine fibroids and significant anemia, GYN was  also  consulted, and we have arranged for outpatient followup with Dr.  Audie Box on April 29.  The patient underwent a Lexiscan Myoview on April  23, revealing an EF of 56% with possible mild anterior wall ischemia.  This being the case, cardiac catheterization was performed on April 24  showing normal coronary arteries and normal LV function.  A CT angio of  the chest was also undertaken, and this was negative for pulmonary  embolism.  An 11-mm hypodensity was noted within the thyroid gland on  the left, and it was recommended that the patient have outpatient  evaluation with ultrasound for baseline.   [name] has been ambulating without recurrence of chest discomfort.  She is being discharged home today in good condition.   DISCHARGE LABORATORIES:  Hemoglobin 7.1, hematocrit 22.9, WBC 8.8,  platelets 307.  Reticulocytes 60.5 (1.6%).  RBC 3.78, MCV 65.6, MCHC  30.7.  INR 1.1.  Sodium 137, potassium 3.9, chloride 104, CO2 24, BUN 9,  creatinine 0.81, glucose 90, calcium 9.1, CK 108, MB 0.7, troponin I  0.01, total cholesterol 132, triglycerides 61,  HDL 43, LDL 77.  TSH  0.641.  Iron 13, TIBC 421, percent saturation 3, UIBC 408.  Vitamin B12  was 630.  Folate 11.1, haptoglobin 181.  Fecal occult blood was  negative.   DISPOSITION:  The patient is being discharged home today in good  condition.   FOLLOWUP PLAN AND APPOINTMENTS:  The patient has followup with Dr.  Myna Hidalgo in a week.  The patient is to follow up with Dr. Audie Box on  April 29 at 1:20 p.m.  The patient to follow up with Dr. Charlton Haws on  May 13 at 12:30 p.m.   DISCHARGE MEDICATIONS:  Chromagen Forte 1 tab b.i.d.   OUTSTANDING LABORATORY STUDIES:  None.   DURATION OF DISCHARGE ENCOUNTER:  Forty minutes, including physician  time.      Nicolasa Ducking, ANP      Luis Abed, MD, [facility]  Electronically Signed    CB/MEDQ  D:  07/09/2007  T:  07/09/2007  Job:  [id number]   cc:   Marcial Pacas P. Fontaine,  M.D.  Rose Phi. Myna Hidalgo, M.D.

## 2010-08-01 NOTE — H&P (Signed)
NAMEKENDRE, [name]                  ACCOUNT NO.:  [id number]   MEDICAL RECORD NO.:  [id number]           PATIENT TYPE:   LOCATION:                               FACILITY:  [facility]   PHYSICIAN:  Della Goo, M.D.      DATE OF BIRTH:   DATE OF ADMISSION:  07/17/2006  DATE OF DISCHARGE:                              HISTORY & PHYSICAL   This is an unassigned patient.   CHIEF COMPLAINT:  Worsening back pain.   HISTORY OF PRESENT ILLNESS:  This is a 40 year old female presenting to  the emergency department secondary to complaints of feeling sick for 2-3  days with nausea, dizziness, fevers, chills and increased back pain.  She was seen in the emergency department one day earlier and evaluated  and prescribed medications for pain.  She returned the next day and had  a complicated subjective history.  She underwent a workup for the back  pain including an MRI study and laboratory studies.  The patient was  found to be severely anemic with a hemoglobin of 6.1.  The patient also  was found to have a urinalysis that was strongly positive.  As for her  low hemoglobin level, the patient reports having a history of anemia and  reports that her hemoglobin runs around 5.  She does not know whether  she has uterine fibroids or not.   PAST MEDICAL HISTORY:  Anemia.   PAST SURGICAL HISTORY:  1. Status post bilateral tubal ligation.  2. Status post cervical cancer in 1996 with one third of the cervix      removed.   MEDICATIONS:  1. Endocet p.r.n.  2. Iron daily.   ALLERGIES:  NO KNOWN DRUG ALLERGIES.   SOCIAL HISTORY:  The patient is unemployed, lives with her sister, has  four children.  No history of smoking or alcohol or illicit drug usage.   FAMILY HISTORY:  Positive for coronary artery disease in her mother,  positive for diabetes, type 2 in her maternal grandmother, hypertension  in her mother, and cancer in her maternal great-grandmother.   REVIEW OF SYSTEMS:  Pertinent for  mentioned above.  The patient denies  any bowel changes, weight loss, syncope, myalgias, or arthropathy other  than back area pain.  She denies having any chest pain or shortness of  breath.   PHYSICAL EXAMINATION:  Obese 40 year old female in discomfort but no  acute distress.  Temperature 101.5, blood pressure 112/59 to 121/74,  heart rate 87-93, respirations 18-22.  O2 saturations 99-100%.  HEENT: Normocephalic, atraumatic.  Pupils equally round and reactive to  light.  Extraocular muscles are intact.  Funduscopic benign.  Oropharynx  is clear.  NECK:  Neck is supple, full range of motion.  No thyromegaly,  adenopathy, or jugular venous distension.  CARDIOVASCULAR:  Regular rate and rhythm.  LUNGS:  Clear to auscultation bilaterally.  ABDOMEN:  Positive bowel sounds, soft, nontender, nondistended.  No  hepatosplenomegaly.  EXTREMITIES:  Without cyanosis, clubbing, or edema.  BACK:  Examination positive costovertebral angle tenderness.  No spinous  process tenderness.  GENITOURINARY:  Deferred.   LABORATORY STUDIES:  White blood cell count 13.9, hemoglobin 6.1,  hematocrit 21.4, MCV 58.7, platelets 322, neutrophils 70%, monocytes  14%.  Urine:  Cloudy, yellow, moderate urine hemoglobin, positive urine  nitrites, large leukocyte esterase.  White blood cells too numerous to  count, many bacteria.   ASSESSMENT:  A 40 year old female being admitted with severe urinary  tract infection/pyelonephritis.  Other diagnoses include microcytic  anemia of which appears to be chronic because she is hemodynamically  stable.   PLAN:  The patient will be placed on IV antibiotic therapy of Rocephin  and ciprofloxacin.  Antibiotic therapy will be further adjusted pending  culture results.  IV fluids have been ordered.  An anemia workup has  also been ordered.  Iron studies, B12, folate will be checked.  Stools  will be Heme tested x1.  The differential diagnosis, however, includes  probable  increased menorrhagia secondary to her tubal ligation history.  However, the patient needs to be considered for evaluation of possible  uterine fibroids.  An outpatient vaginal ultrasound study will be  considered.  GI and DVT prophylaxis has also been ordered and p.r.n.  pain medication and antiemetic therapy has also been ordered.      Della Goo, M.D.  Electronically Signed     HJ/MEDQ  D:  07/17/2006  T:  07/18/2006  Job:  [id number]

## 2010-08-01 NOTE — Discharge Summary (Signed)
[name], [name]                  ACCOUNT NO.:  [id number]   MEDICAL RECORD NO.:  [id number]          PATIENT TYPE:  INP   LOCATION:  1614                         FACILITY:  [facility]   PHYSICIAN:  Isidor Holts, M.D.  DATE OF BIRTH:  06-20-1970   DATE OF ADMISSION:  07/17/2006  DATE OF DISCHARGE:  07/20/2006                               DISCHARGE SUMMARY   PRIMARY MEDICAL DOCTOR:  Gentry Fitz.   DISCHARGE DIAGNOSES:  1. Acute pyelonephritis.  2. Severe microcytic anemia. Required transfusion of 4 units of packed      red blood cells.  3. Uterine fibroids.  4. Menorrhagia, secondary to #3 above.   DISCHARGE MEDICATIONS:  1. Ferrous sulfate 325 mg p.o. b.i.d.  2. Ciprofloxacin 500 mg p.o. b.i.d. for 7 days only.  3. Vicodin (5/500) 1 p.o. p.r.n. q.8.hours. #21   PROCEDURES:  1. Chest x-ray dated Jul 17, 2006 that showed no active cardiopulmonary      disease.  2. MRI of the lumbosacral spine dated Jul 17, 2006.  This was a normal      examination.  3. Pelvic ultrasound scan dated Jul 18, 2006.  This showed at least      three measurable uterine fibroids, the largest in the posterior      fundus approximately 3.5 cm.  No submucosal fibroids.  There was a      1.6 cm hemorrhagic cyst in the right ovary.  Normal appearing left      ovary.   CONSULTATIONS:  None.   ADMISSION HISTORY:  As in H&P note of Jul 17, 2006 dictated by Dr.  Della Goo.  However, in brief this is a 40 year old female, with  a past medical history significant for chronic iron deficiency anemia,  s/p bilateral tubal ligation, history of cervical cancer in 1996, s/p  removal of one third of the cervix, who presents with a 3-day history of  nausea, vomiting, fever, chills, back pain.  Initial labs demonstrated a  profound microcytic anemia, with a hemoglobin of 6.1.  MCV was 58.7.  The patient was pyrexial, with a temperature of 101.5.  Urinalysis  showed a positive sediment, consistent with a urinary  tract infection.  The patient was therefore admitted for further evaluation,  investigation, management.   HOSPITAL COURSE:  1. Acute pyelonephritis.  For details of presentation, refer to      admission history above.  As mentioned above, the patient had a      positive urine sediment with white cells too numerous to count and      many bacteria.  Given the constellation of clinical findings, she      clearly had acute pyelonephritis.  She was treated therefore with a      combination of intravenous Rocephin and Ciprofloxacin.  Urine      cultures were sent, and subsequently grew over 100,000 colonies of      E. coli, sensitive to Rocephin and Ciprofloxacin.  Rocephin was      therefore discontinued.  As of Jul 20, 2006, the patient had  defervesced and felt considerably better, and symptoms were      ameliorated.  We were able to switch her to oral antibiotic      treatment, which she is anticipated to continue for seven days, ie,      a total course of antibiotic treatment of 10 days.  White cell      count, which was 13.9 at the time of admission, had as of Jul 20, 2006, normalized at 9.0.   1. Severe microcytic anemia.  The patient has a known history of      chronic anemia.  She also has a known history of heavy periods      and had been on chronic iron supplementation.  Hemoglobin at the      time of presentation was 6.1, with an MCV of 58.9.  Iron studies      confirmed iron deficiency, with an iron level less than 10,      ferritin 15, B12 of 517.  RBC folate was 1153.  She was transfused      with 4 units of PRBCs resulting in a posttransfusion hemoglobin of      10.0 on Jul 20, 2006.  She has been continued on iron      supplementation.   1. Uterine fibroids.  As mentioned above, the patient does have heavy      periods.  While she was in the hospital, her period commenced, i.e.      on Jul 18, 2006, and this was indeed heavy.  Uterine ultrasound      confirmed  multiple uterine fibroids and an incidental finding of a      hemorrhagic cyst of the right ovary.  The patient has been      recommended to seek GYN evaluation, following discharge.  She      assures me that she plans to establish a PMD at the [facility]      within the next few days and will obtain a referral.   DISPOSITION:  The patient was considered clinically stable, and  sufficiently recovered for discharge on Jul 20, 2006.   DIET:  No restrictions.   ACTIVITY:  As tolerated.   FOLLOWUP INSTRUCTIONS:  The patient is to establish followup with her  primary M.D. within 1-2 weeks.  She will certainly need a GYN referral.  This has been conveyed to her.  She has verbalized understanding.  The  patient may return to regular duties on Jul 23, 2006.      Isidor Holts, M.D.  Electronically Signed     CO/MEDQ  D:  07/20/2006  T:  07/20/2006  Job:  [id number]

## 2010-10-11 ENCOUNTER — Emergency Department (HOSPITAL_COMMUNITY)
Admission: EM | Admit: 2010-10-11 | Discharge: 2010-10-12 | Disposition: A | Discharge status: Home / Self Care | Payer: Medicaid Other | Attending: Emergency Medicine | Admitting: Emergency Medicine

## 2010-10-11 ENCOUNTER — Emergency Department (HOSPITAL_COMMUNITY): Payer: Medicaid Other

## 2010-10-11 DIAGNOSIS — S0990XA Unspecified injury of head, initial encounter: Secondary | ICD-10-CM | POA: Insufficient documentation

## 2010-10-11 DIAGNOSIS — Y92009 Unspecified place in unspecified non-institutional (private) residence as the place of occurrence of the external cause: Secondary | ICD-10-CM | POA: Insufficient documentation

## 2010-10-11 DIAGNOSIS — M542 Cervicalgia: Secondary | ICD-10-CM | POA: Insufficient documentation

## 2010-10-11 DIAGNOSIS — S0230XA Fracture of orbital floor, unspecified side, initial encounter for closed fracture: Secondary | ICD-10-CM | POA: Insufficient documentation

## 2010-10-11 DIAGNOSIS — H532 Diplopia: Secondary | ICD-10-CM | POA: Insufficient documentation

## 2010-10-11 DIAGNOSIS — H5710 Ocular pain, unspecified eye: Secondary | ICD-10-CM | POA: Insufficient documentation

## 2010-10-11 DIAGNOSIS — S0003XA Contusion of scalp, initial encounter: Secondary | ICD-10-CM | POA: Insufficient documentation

## 2010-10-11 DIAGNOSIS — S0083XA Contusion of other part of head, initial encounter: Secondary | ICD-10-CM | POA: Insufficient documentation

## 2010-10-11 DIAGNOSIS — S0280XA Fracture of other specified skull and facial bones, unspecified side, initial encounter for closed fracture: Secondary | ICD-10-CM | POA: Insufficient documentation

## 2010-10-16 HISTORY — PX: EYE SURGERY: SHX253

## 2010-10-17 ENCOUNTER — Encounter (HOSPITAL_COMMUNITY)
Admission: RE | Admit: 2010-10-17 | Discharge: 2010-10-17 | Disposition: A | Discharge status: Home / Self Care | Payer: Medicaid Other | Source: Ambulatory Visit | Attending: Oral and Maxillofacial Surgery | Admitting: Oral and Maxillofacial Surgery

## 2010-10-17 LAB — CBC
HCT: 34.1 % — ABNORMAL LOW (ref 36.0–46.0)
MCH: 26.4 pg (ref 26.0–34.0)
MCHC: 32.8 g/dL (ref 30.0–36.0)
MCV: 80.4 fL (ref 78.0–100.0)
RDW: 14.6 % (ref 11.5–15.5)

## 2010-10-17 LAB — SURGICAL PCR SCREEN: Staphylococcus aureus: NEGATIVE

## 2010-10-20 ENCOUNTER — Ambulatory Visit (HOSPITAL_COMMUNITY)
Admission: RE | Admit: 2010-10-20 | Discharge: 2010-10-20 | Disposition: A | Discharge status: Home / Self Care | Payer: Medicaid Other | Source: Ambulatory Visit | Attending: Oral and Maxillofacial Surgery | Admitting: Oral and Maxillofacial Surgery

## 2010-10-20 DIAGNOSIS — S0230XA Fracture of orbital floor, unspecified side, initial encounter for closed fracture: Secondary | ICD-10-CM | POA: Insufficient documentation

## 2010-10-20 DIAGNOSIS — T7492XA Unspecified child maltreatment, confirmed, initial encounter: Secondary | ICD-10-CM | POA: Insufficient documentation

## 2010-10-20 DIAGNOSIS — IMO0002 Reserved for concepts with insufficient information to code with codable children: Secondary | ICD-10-CM | POA: Insufficient documentation

## 2010-10-20 DIAGNOSIS — Y998 Other external cause status: Secondary | ICD-10-CM | POA: Insufficient documentation

## 2010-10-20 DIAGNOSIS — T7491XA Unspecified adult maltreatment, confirmed, initial encounter: Secondary | ICD-10-CM | POA: Insufficient documentation

## 2010-10-20 DIAGNOSIS — Y92009 Unspecified place in unspecified non-institutional (private) residence as the place of occurrence of the external cause: Secondary | ICD-10-CM | POA: Insufficient documentation
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
## 2010-12-09 LAB — TSH: TSH: 0.641

## 2010-12-09 LAB — TYPE AND SCREEN
ABO/RH(D): O POS
Antibody Screen: NEGATIVE

## 2010-12-09 LAB — CBC
HCT: 22.9 — ABNORMAL LOW
HCT: 23.6 — ABNORMAL LOW
HCT: 23.8 — ABNORMAL LOW
Hemoglobin: 7.1 — CL
Hemoglobin: 7.1 — CL
Hemoglobin: 7.3 — CL
MCHC: 29.8 — ABNORMAL LOW
MCHC: 30
MCHC: 30.7
MCV: 64 — ABNORMAL LOW
MCV: 64.6 — ABNORMAL LOW
MCV: 64.6 — ABNORMAL LOW
MCV: 64.7 — ABNORMAL LOW
MCV: 65.3 — ABNORMAL LOW
MCV: 65.6 — ABNORMAL LOW
Platelets: 301
Platelets: 307
Platelets: 326
Platelets: 373
Platelets: 374
RBC: 3.61 — ABNORMAL LOW
RBC: 3.97
RDW: 18.5 — ABNORMAL HIGH
RDW: 18.6 — ABNORMAL HIGH
RDW: 19.1 — ABNORMAL HIGH
RDW: 19.3 — ABNORMAL HIGH
WBC: 6.2
WBC: 6.9
WBC: 8.4

## 2010-12-09 LAB — BASIC METABOLIC PANEL
BUN: 4 — ABNORMAL LOW
CO2: 24
Calcium: 8.7
Chloride: 104
Chloride: 108
Creatinine, Ser: 0.79
Creatinine, Ser: 0.81
GFR calc Af Amer: >60
GFR calc Af Amer: >60
GFR calc non Af Amer: >60

## 2010-12-09 LAB — POCT I-STAT, CHEM 8
BUN: 6
Creatinine, Ser: 0.9
Hemoglobin: 9.2 — ABNORMAL LOW
Potassium: 3.6
Sodium: 140
TCO2: 23

## 2010-12-09 LAB — CK TOTAL AND CKMB (NOT AT ARMC)
CK, MB: 0.7
Relative Index: 0.6
Total CK: 108
Total CK: 109
Total CK: 127

## 2010-12-09 LAB — VITAMIN B12: Vitamin B-12: 630 (ref 211–911)

## 2010-12-09 LAB — IRON AND TIBC
Iron: 13 — ABNORMAL LOW
Saturation Ratios: 3 — ABNORMAL LOW
TIBC: 421
UIBC: 408

## 2010-12-09 LAB — DIFFERENTIAL
Basophils Absolute: 0.1
Eosinophils Absolute: 0.5
Eosinophils Relative: 7 — ABNORMAL HIGH
Monocytes Absolute: 0.5

## 2010-12-09 LAB — POCT CARDIAC MARKERS
Myoglobin, poc: 33.4
Myoglobin, poc: 47
Operator id: 277751
Operator id: 277751
Troponin i, poc: 0.13 — ABNORMAL HIGH

## 2010-12-09 LAB — LIPID PANEL: VLDL: 12

## 2010-12-09 LAB — PROTIME-INR: Prothrombin Time: 14.2

## 2010-12-09 LAB — RETICULOCYTES: Retic Ct Pct: 1.6

## 2010-12-09 LAB — OCCULT BLOOD X 1 CARD TO LAB, STOOL: Fecal Occult Bld: NEGATIVE

## 2010-12-09 LAB — IRON: Iron: <10 — ABNORMAL LOW

## 2010-12-09 LAB — TROPONIN I: Troponin I: 0.01

## 2010-12-25 LAB — DIFFERENTIAL
Basophils Absolute: 0.1
Basophils Relative: 1
Eosinophils Absolute: 0.6
Eosinophils Relative: 8 — ABNORMAL HIGH
Lymphocytes Relative: 32
Lymphs Abs: 2.7
Monocytes Absolute: 0.7
Monocytes Relative: 8
Neutro Abs: 4.2
Neutrophils Relative %: 51

## 2010-12-25 LAB — WET PREP, GENITAL
Clue Cells Wet Prep HPF POC: NONE SEEN
Trich, Wet Prep: NONE SEEN
WBC, Wet Prep HPF POC: NONE SEEN

## 2010-12-25 LAB — COMPREHENSIVE METABOLIC PANEL
ALT: 11
Albumin: 3.4 — ABNORMAL LOW
Alkaline Phosphatase: 67
Calcium: 7.9 — ABNORMAL LOW
GFR calc Af Amer: >60
Potassium: 3.1 — ABNORMAL LOW
Sodium: 133 — ABNORMAL LOW
Total Protein: 7.5

## 2010-12-25 LAB — CBC
HCT: 29.1 — ABNORMAL LOW
Hemoglobin: 9 — ABNORMAL LOW
MCHC: 30.8
MCV: 74.8 — ABNORMAL LOW
Platelets: 391
RBC: 3.89
RDW: 17.8 — ABNORMAL HIGH
WBC: 8.3

## 2010-12-25 LAB — URINALYSIS, ROUTINE W REFLEX MICROSCOPIC
Glucose, UA: NEGATIVE
Ketones, ur: NEGATIVE
Leukocytes, UA: NEGATIVE
Nitrite: NEGATIVE
Specific Gravity, Urine: 1.028
pH: 6.5

## 2010-12-25 LAB — URINE MICROSCOPIC-ADD ON

## 2010-12-25 LAB — POCT PREGNANCY, URINE
Operator id: 272551
Preg Test, Ur: NEGATIVE

## 2010-12-25 LAB — GC/CHLAMYDIA PROBE AMP, GENITAL: Chlamydia, DNA Probe: NEGATIVE

## 2010-12-26 LAB — COMPREHENSIVE METABOLIC PANEL WITH GFR
AST: 25
Alkaline Phosphatase: 68
BUN: 10
CO2: 21
Chloride: 104
Creatinine, Ser: 0.7
GFR calc non Af Amer: >60
Total Bilirubin: 0.3

## 2010-12-26 LAB — COMPREHENSIVE METABOLIC PANEL
ALT: 11
Albumin: 3.6
Calcium: 9.1
GFR calc Af Amer: >60
Glucose, Bld: 88
Potassium: 3.6
Sodium: 134 — ABNORMAL LOW
Total Protein: 8.1

## 2010-12-26 LAB — CBC
HCT: 31.1 — ABNORMAL LOW
Hemoglobin: 9.9 — ABNORMAL LOW
MCHC: 31.9
MCV: 74.8 — ABNORMAL LOW
Platelets: 364
RBC: 4.15
RDW: 15.1 — ABNORMAL HIGH
WBC: 8.7

## 2010-12-26 LAB — URINALYSIS, ROUTINE W REFLEX MICROSCOPIC
Bilirubin Urine: NEGATIVE
Glucose, UA: NEGATIVE
Hgb urine dipstick: NEGATIVE
Ketones, ur: NEGATIVE
Nitrite: NEGATIVE
Protein, ur: NEGATIVE
Specific Gravity, Urine: 1.02
Urobilinogen, UA: 1
pH: 6.5

## 2010-12-26 LAB — DIFFERENTIAL
Basophils Absolute: 0
Basophils Relative: 1
Eosinophils Absolute: 0.4
Eosinophils Relative: 4
Lymphocytes Relative: 25
Lymphs Abs: 2.2
Monocytes Absolute: 0.7
Monocytes Relative: 8
Neutro Abs: 5.4
Neutrophils Relative %: 62

## 2010-12-26 LAB — POCT PREGNANCY, URINE
Operator id: 265201
Preg Test, Ur: NEGATIVE

## 2010-12-26 LAB — CULTURE, BLOOD (ROUTINE X 2): Culture: NO GROWTH

## 2010-12-26 LAB — LIPASE, BLOOD: Lipase: 24

## 2011-01-19 ENCOUNTER — Emergency Department (INDEPENDENT_AMBULATORY_CARE_PROVIDER_SITE_OTHER)
Admission: EM | Admit: 2011-01-19 | Discharge: 2011-01-19 | Disposition: A | Discharge status: Home / Self Care | Payer: Medicaid Other | Source: Home / Self Care | Attending: Emergency Medicine | Admitting: Emergency Medicine

## 2011-01-19 ENCOUNTER — Encounter: Payer: Self-pay | Admitting: *Deleted

## 2011-01-19 DIAGNOSIS — H5710 Ocular pain, unspecified eye: Secondary | ICD-10-CM

## 2011-01-19 MED ORDER — POLYMYXIN B-TRIMETHOPRIM 10000-0.1 UNIT/ML-% OP SOLN: 1.0000 [drp] | OPHTHALMIC | Status: AC

## 2011-01-19 MED ORDER — TRAMADOL HCL 50 MG PO TABS: 100.0000 mg | ORAL_TABLET | Freq: Three times a day (TID) | ORAL | Status: AC | PRN

## 2011-01-19 MED ORDER — SULFAMETHOXAZOLE-TMP DS 800-160 MG PO TABS: 2.0000 | ORAL_TABLET | Freq: Two times a day (BID) | ORAL | Status: AC

## 2011-01-19 NOTE — Discharge Instructions (Signed)
Pain of Unknown Etiology (Pain Without a Known Cause) You have come to your caregiver because of pain. Pain can occur in any part of the body. Often there is not a definite cause. If your laboratory (blood or urine) work was normal and x-rays or other studies were normal, your caregiver may treat you without knowing the cause of the pain. An example of this is the headache. Most headaches are diagnosed by taking a history. This means your caregiver asks you questions about your headaches. Your caregiver determines a treatment based on your answers. Usually testing done for headaches is normal. Often testing is not done unless there is no response to medications. Regardless of where your pain is located today, you can be given medications to make you comfortable. If no physical cause of pain can be found, most cases of pain will gradually leave as suddenly as they came.  If you have a painful condition and no reason can be found for the pain, It is importantthat you follow up with your caregiver. If the pain becomes worse or does not go away, it may be necessary to repeat tests and look further for a possible cause.  Only take over-the-counter or prescription medicines for pain, discomfort, or fever as directed by your caregiver.   For the protection of your privacy, test results can not be given over the phone. Make sure you receive the results of your test. Ask as to how these results are to be obtained if you have not been informed. It is your responsibility to obtain your test results.   You may continue all activities unless the activities cause more pain. When the pain lessens, it is important to gradually resume normal activities. Resume activities by beginning slowly and gradually increasing the intensity and duration of the activities or exercise. During periods of severe pain, bed-rest may be helpful. Lay or sit in any position that is comfortable.   Ice used for acute (sudden) conditions may be  effective. Use a large plastic bag filled with ice and wrapped in a towel. This may provide pain relief.   See your caregiver for continued problems. They can help or refer you for exercises or physical therapy if necessary.  If you were given medications for your condition, do not drive, operate machinery or power tools, or sign legal documents for 24 hours. Do not drink alcohol, take sleeping pills, or take other medications that may interfere with treatment. See your caregiver immediately if you have pain that is becoming worse and not relieved by medications. Document Released: 11/25/2000 Document Revised: 11/12/2010 Document Reviewed: 03/02/2005 [employer] Patient Information 2012 [employer]. 
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
## 2011-01-19 NOTE — ED Notes (Signed)
Pt  REPORTS  SHE  HAD  SURGERY    L  EYE     SEVERAL MONTHS  AGO      SHE  STATES   SHE  NOTICED BLOOD  COMING  FROM  THE  BOTTOM OF  HER  EYE       INNER   LOWER    LID  AREA
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
## 2011-02-23 IMAGING — CR DG CHEST 2V
2 series · 2 of 2 positions shown · non-contrast
Comparison: 07/06/2007.

CLINICAL DATA: Smoker with chest pain, arm pain, neck pain and back
pain.

CHEST - 2 VIEW

[w chest pa]
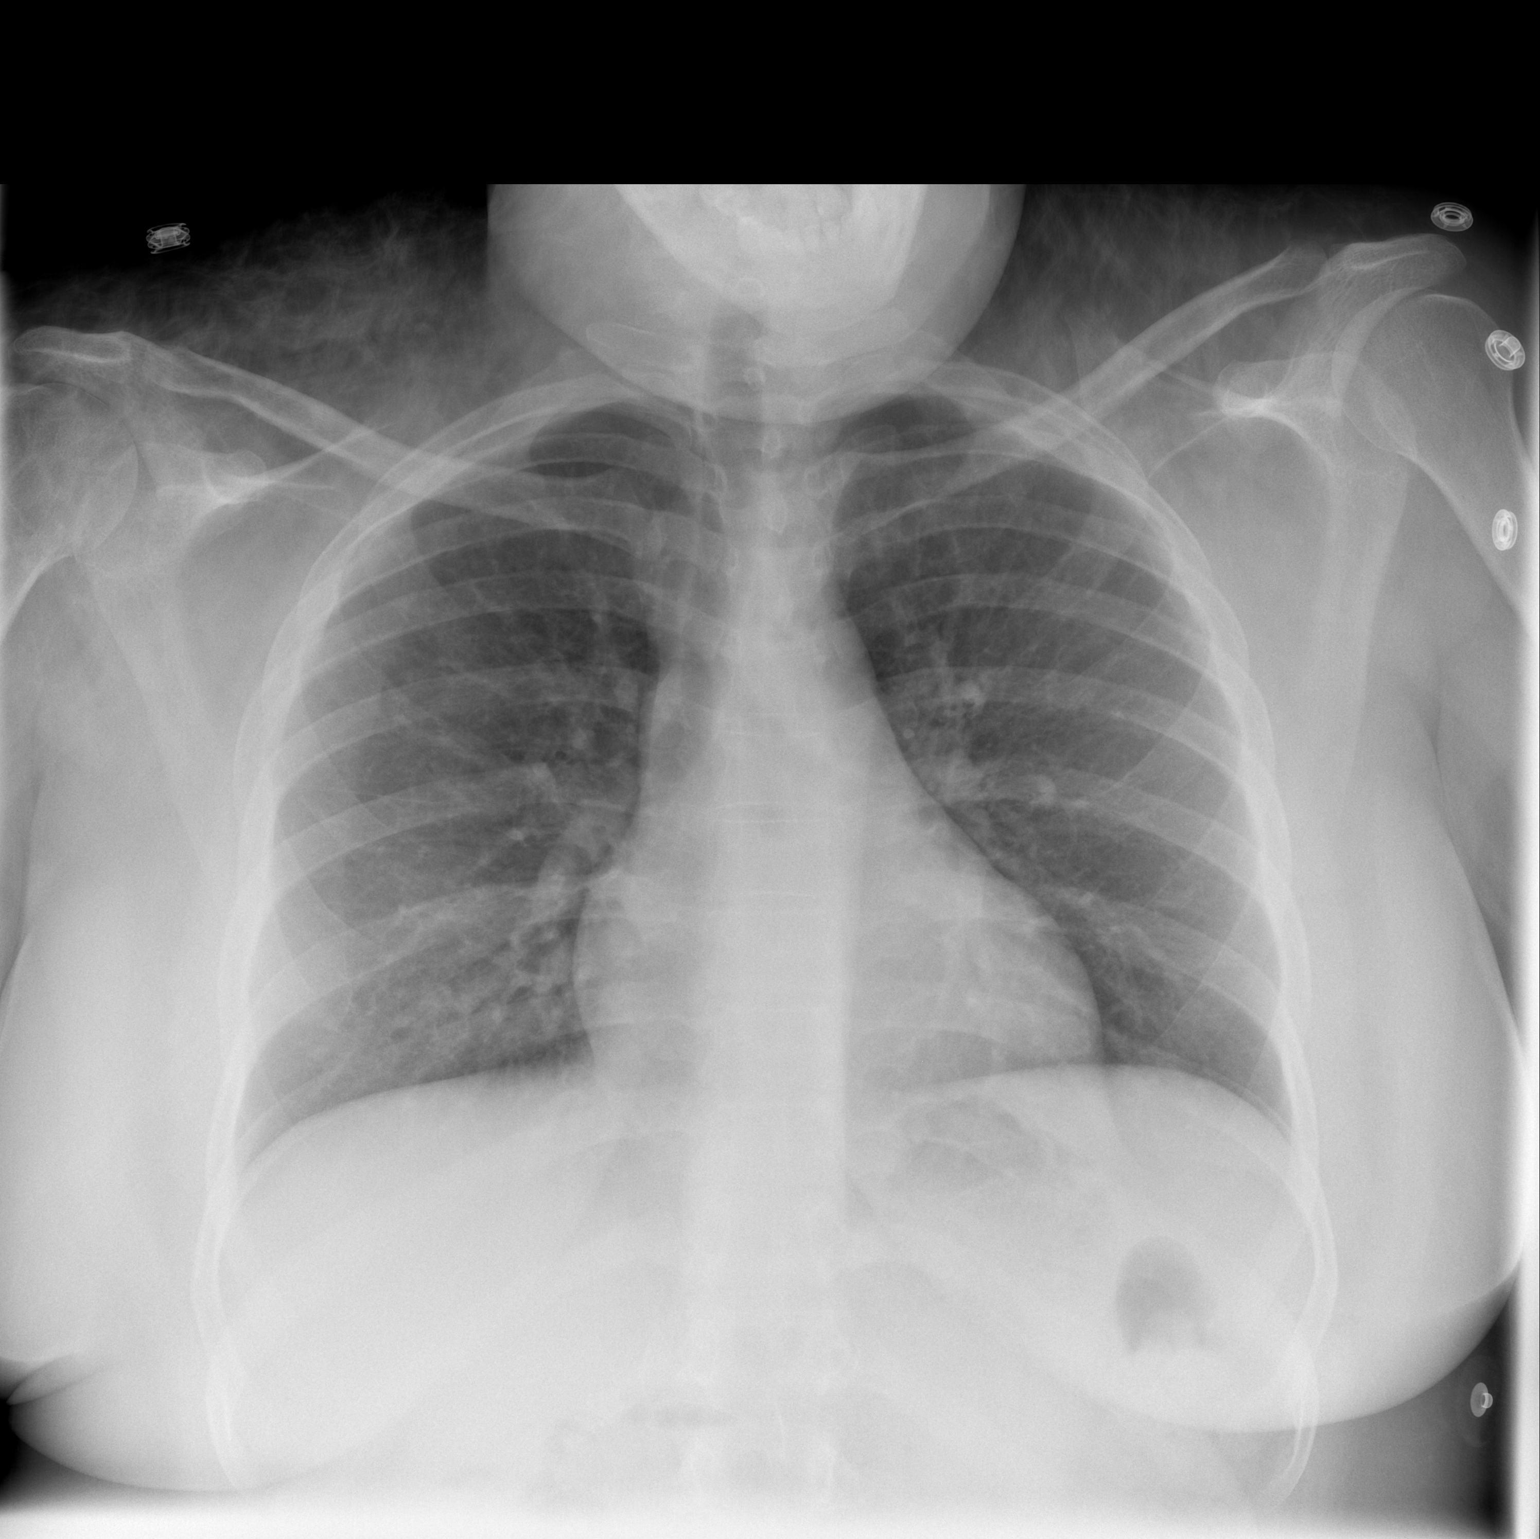

[w chest lat]
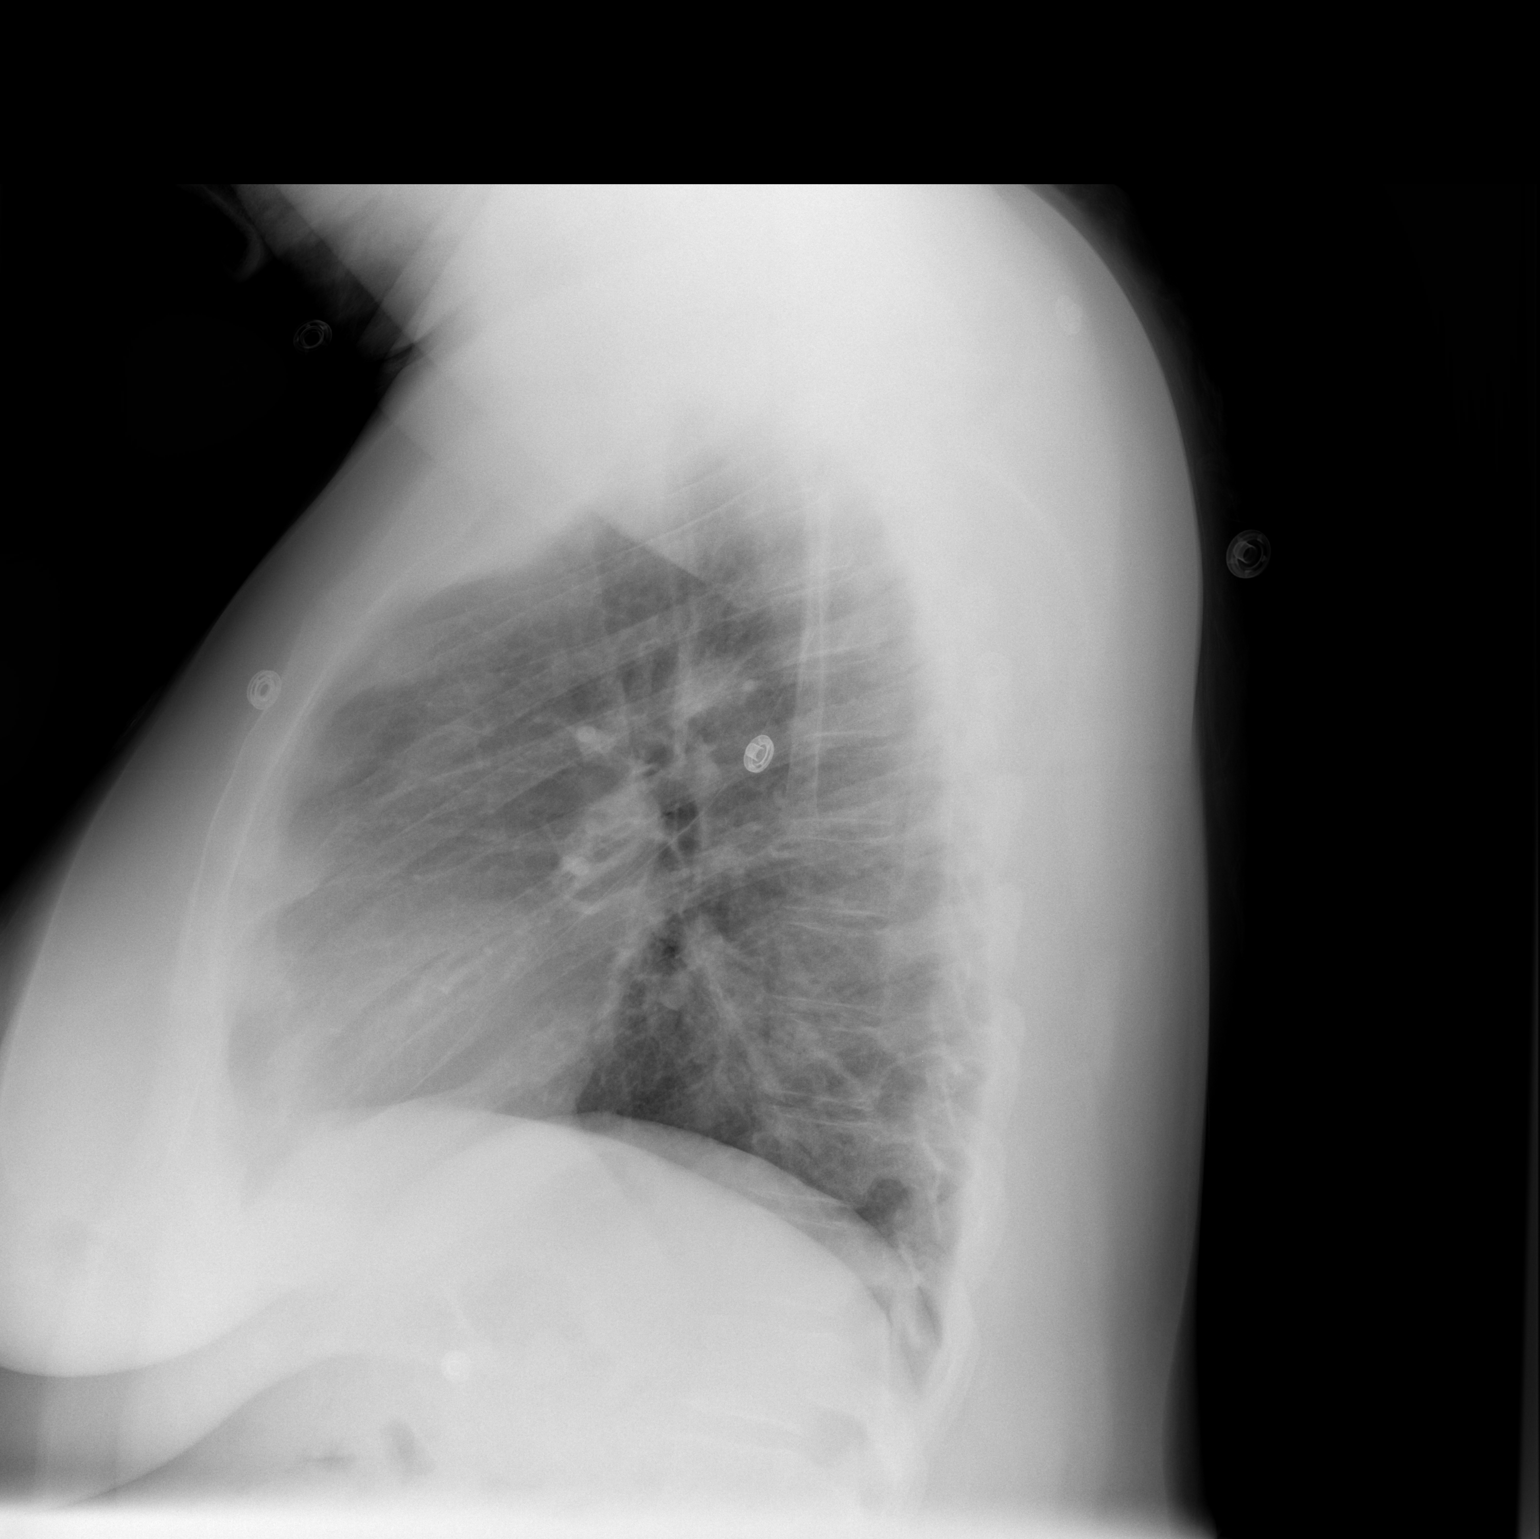

[2 of 2 positions shown; findings below may reference images not displayed]

FINDINGS: Stable normal sized heart clear lungs and minimal diffuse
peribronchial thickening.  Unremarkable bones.
IMPRESSION: Stable minimal chronic bronchitic changes.  No acute abnormality.

## 2011-06-30 IMAGING — US US TRANSVAGINAL NON-OB
1 series · 13 of 25 positions shown · non-contrast
Comparison: CT abdomen pelvis 07/09/2008 and ultrasound pelvis
07/18/2006.

CLINICAL DATA: Abdominal pain.

TRANSABDOMINAL AND TRANSVAGINAL ULTRASOUND OF PELVIS
TECHNIQUE: Both transabdominal and transvaginal ultrasound
examinations of the pelvis were performed including evaluation of
the uterus, ovaries, adnexal regions, and pelvic cul-de-sac.

[Series 1: us transvaginal non-ob · 0.22mm/px · 74 acquisitions, 13 frames shown]
[im 1/74]
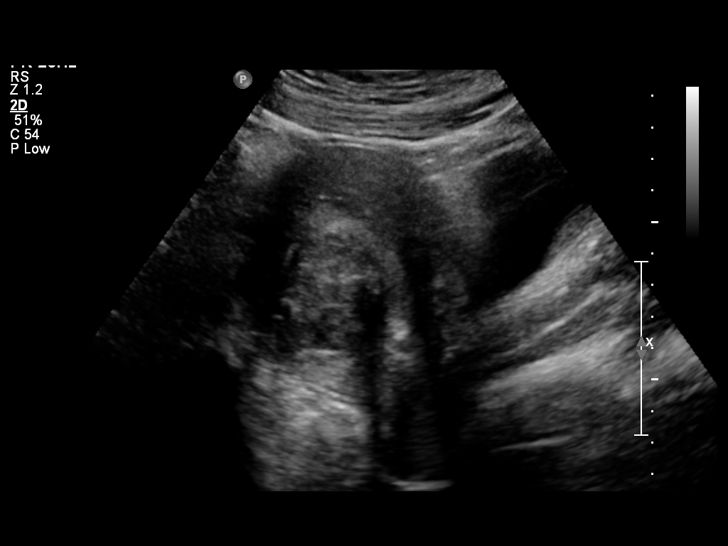
[im 7/74]
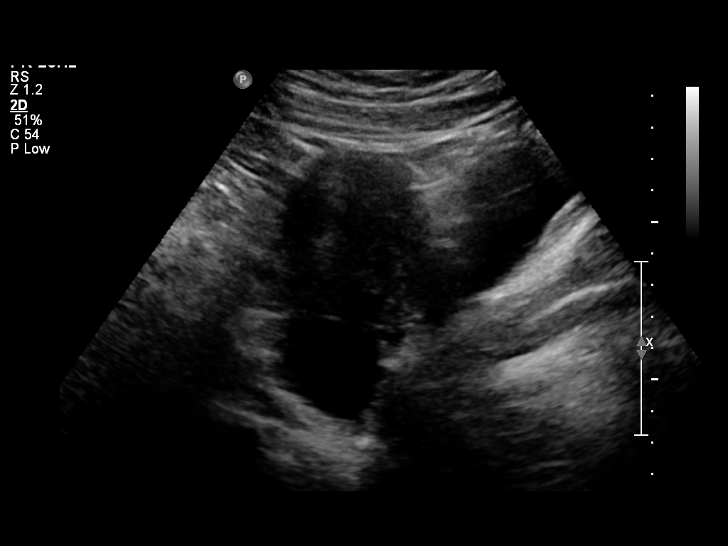
[im 13/74]
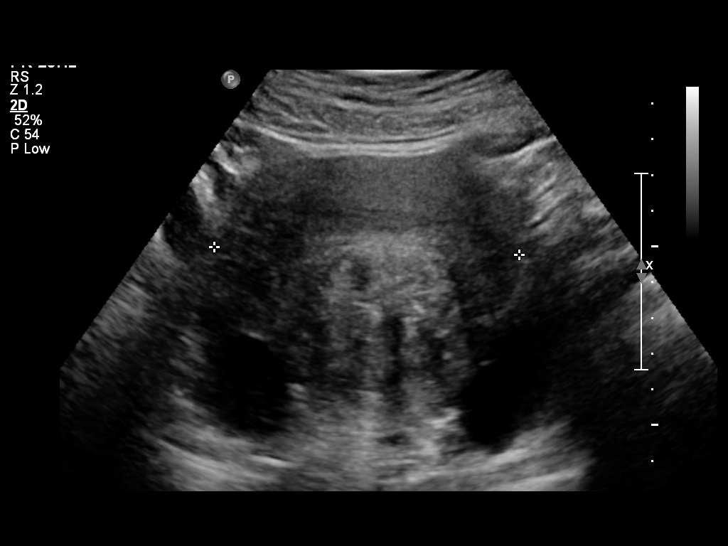
[im 19/74]
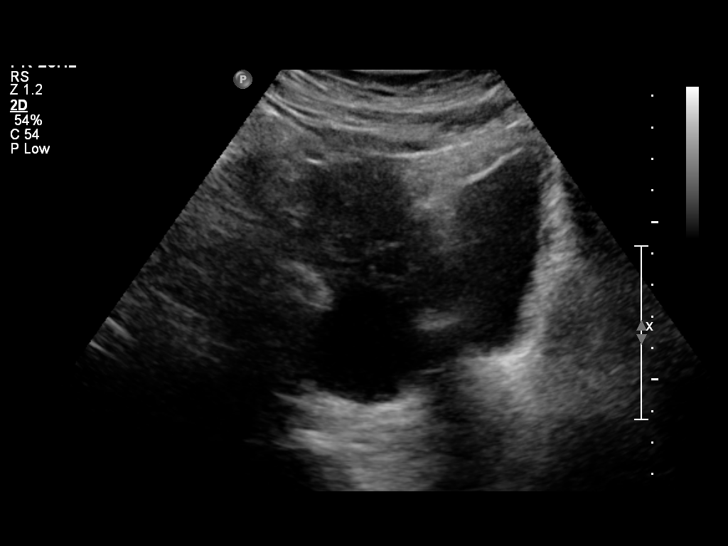
[im 25/74]
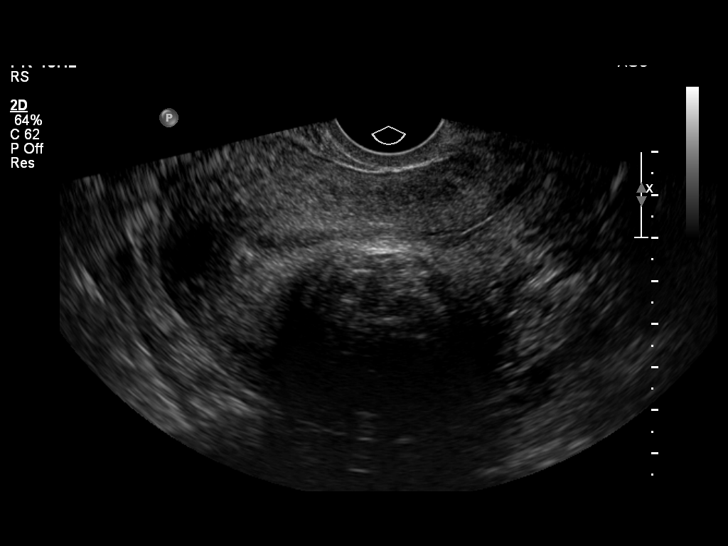
[im 31/74]
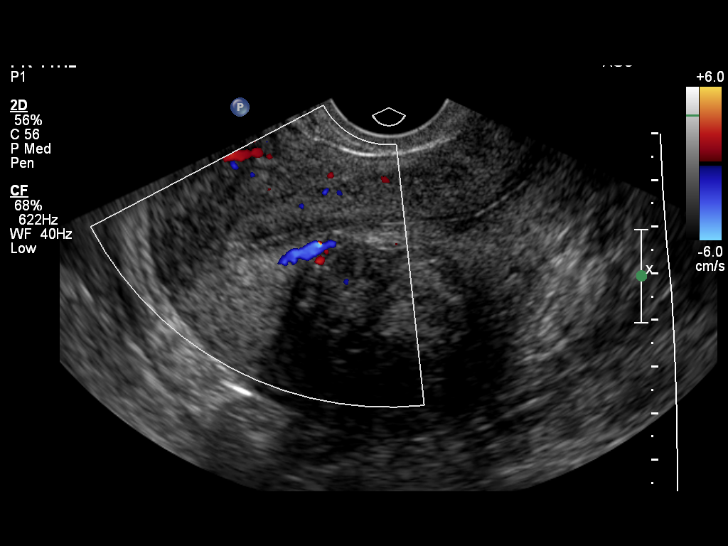
[im 37/74]
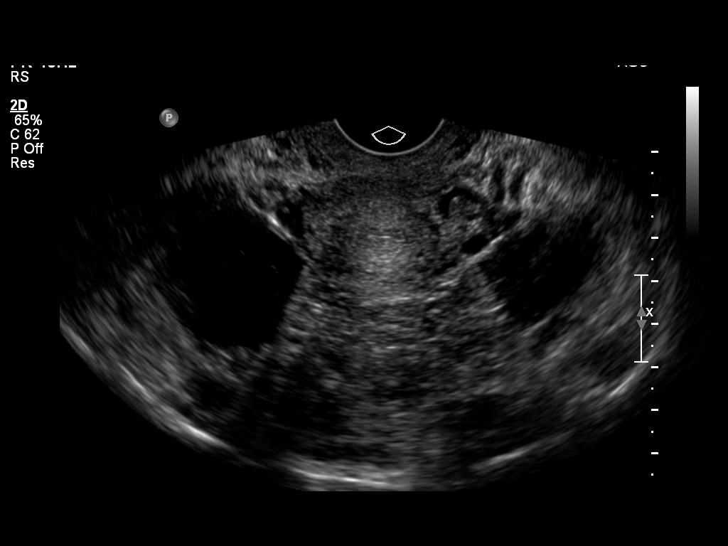
[im 43/74]
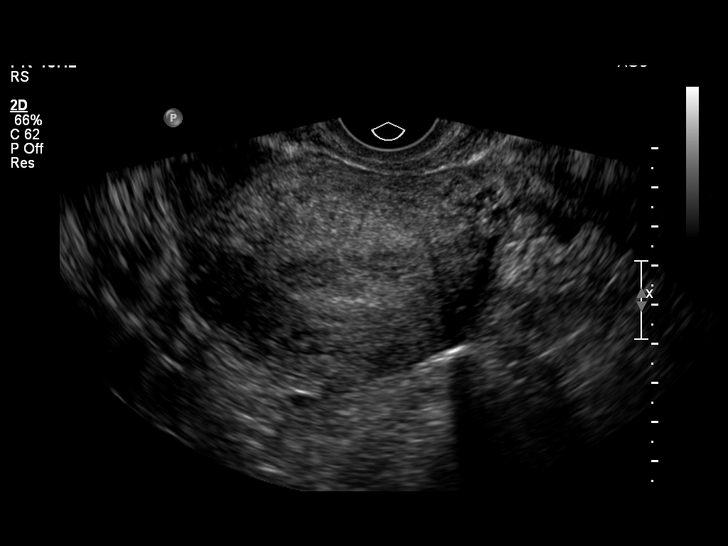
[im 49/74]
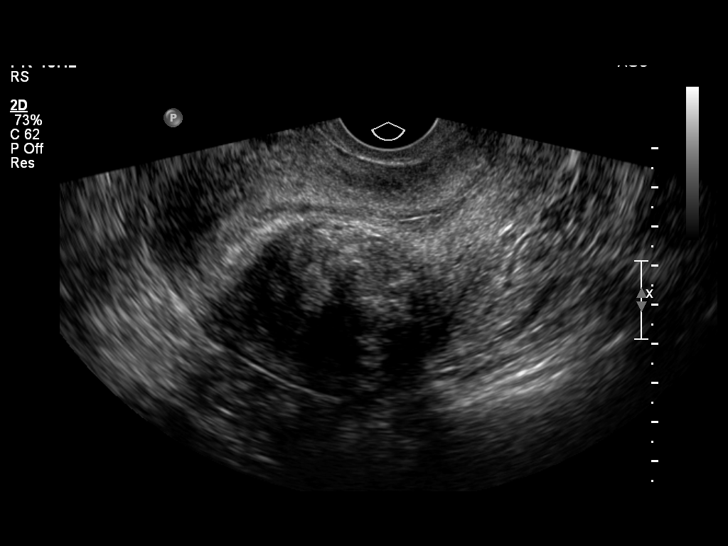
[im 55/74]
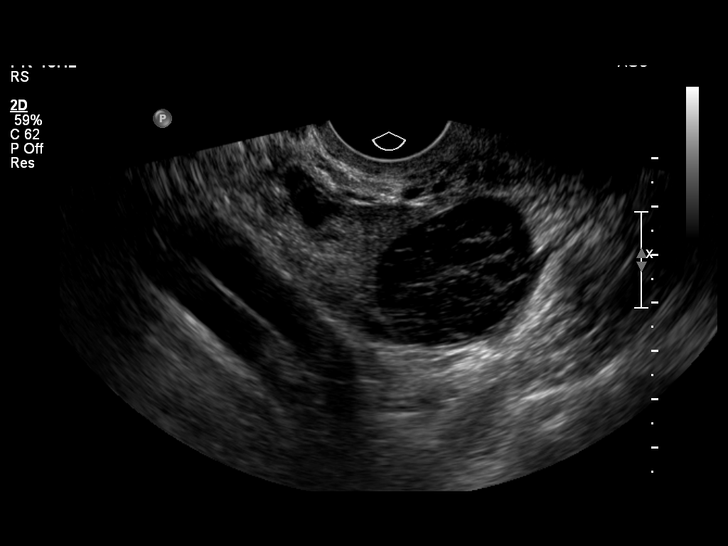
[im 61/74]
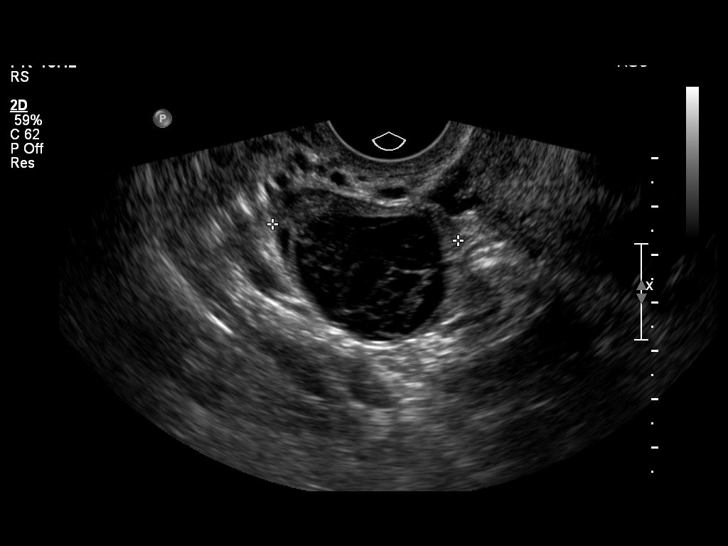
[im 67/74]
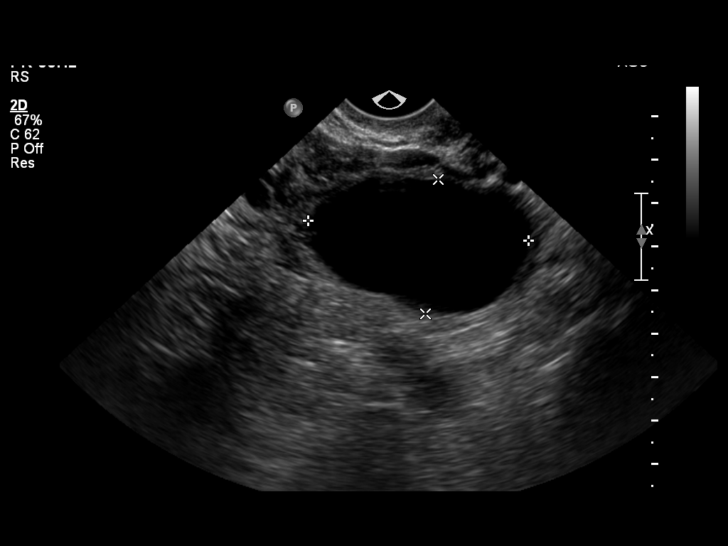
[im 74/74]
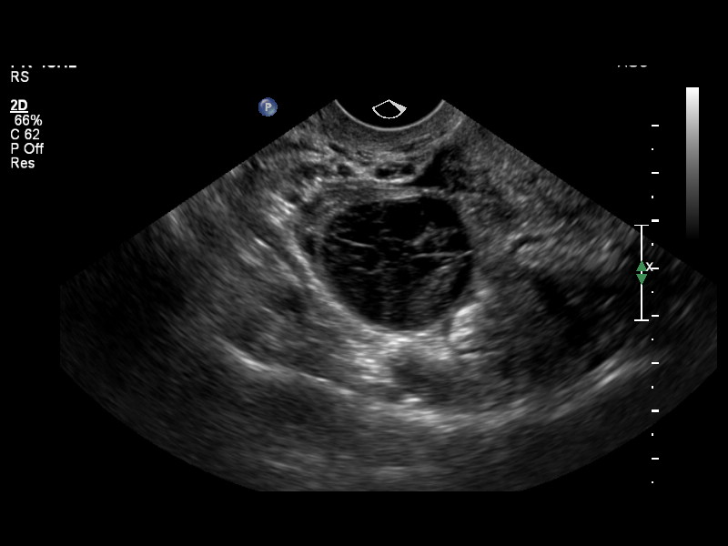

[13 of 25 positions shown; findings below may reference images not displayed]

FINDINGS: Uterus measures 10.8 cm length by 6.6 cm AP diameter by 7.9 cm in
width.  At least two focal fibroids are identified, with the
largest being in the posterior uterine body measuring 4.8 x 4.2 x
4.9 cm.  The overlying endometrial stripe appears slightly uplifted
by this fibroid, suggesting a submucosal component.  In the right
aspect of the upper uterine body/fundus is a 4.8 x 4.2 x 4.9 cm
intramural fibroid.

Endometrium is trilaminar and measures a maximum of 1.3 cm, within
normal limits.

Right Ovary measures 5.7 x 3.5 x 3.9 cm and contains a 3.2 x 3.4 x
3.1 cm cyst with numerous Ocana internal septations, which do not
demonstrate color Doppler flow.  Color Doppler flow is seen within
the surrounding ovarian parenchyma.

Left Ovary measures measures 5.7 x 3.5 x 4.1 cm and contains a
x 3.1 x 3.5 cm unilocular simple cyst.  Color Doppler flow is
identified to the surrounding ovarian parenchyma.

Other Findings:  No free pelvic fluid is identified.
IMPRESSION: 1.  Complex 3.4 cm right ovarian cyst has sonographic features most
suggestive of a hemorrhagic cyst.
2.  Unilocular 5.1 cm simple left ovarian cyst.  A follow-up pelvic
ultrasound in 6-8 weeks is recommended to evaluate both the left
and right ovarian cysts.
3.  Fibroid uterus as described above.
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
## 2011-08-20 ENCOUNTER — Emergency Department (HOSPITAL_COMMUNITY): Payer: Medicaid Other

## 2011-08-20 ENCOUNTER — Emergency Department (HOSPITAL_COMMUNITY)
Admission: EM | Admit: 2011-08-20 | Discharge: 2011-08-20 | Disposition: A | Discharge status: Home / Self Care | Payer: Medicaid Other | Attending: Emergency Medicine | Admitting: Emergency Medicine

## 2011-08-20 ENCOUNTER — Encounter (HOSPITAL_COMMUNITY): Payer: Self-pay | Admitting: *Deleted

## 2011-08-20 DIAGNOSIS — Y92009 Unspecified place in unspecified non-institutional (private) residence as the place of occurrence of the external cause: Secondary | ICD-10-CM | POA: Insufficient documentation

## 2011-08-20 DIAGNOSIS — S92919A Unspecified fracture of unspecified toe(s), initial encounter for closed fracture: Secondary | ICD-10-CM | POA: Insufficient documentation

## 2011-08-20 DIAGNOSIS — W010XXA Fall on same level from slipping, tripping and stumbling without subsequent striking against object, initial encounter: Secondary | ICD-10-CM | POA: Insufficient documentation

## 2011-08-20 DIAGNOSIS — Y9302 Activity, running: Secondary | ICD-10-CM | POA: Insufficient documentation

## 2011-08-20 MED ORDER — IBUPROFEN 800 MG PO TABS: 800.0000 mg | ORAL_TABLET | Freq: Three times a day (TID) | ORAL | Status: AC | PRN

## 2011-08-20 MED ORDER — HYDROCODONE-ACETAMINOPHEN 5-325 MG PO TABS: 1.0000 | ORAL_TABLET | ORAL | Status: AC | PRN

## 2011-08-20 MED ORDER — HYDROCODONE-ACETAMINOPHEN 5-325 MG PO TABS
1.0000 | ORAL_TABLET | Freq: Once | ORAL | Status: AC
  Administered 2011-08-20: 1 via ORAL
  Filled 2011-08-20: qty 1

## 2011-08-20 NOTE — ED Provider Notes (Signed)
History     CSN: [id number]  Arrival date & time 08/20/11  1633   First MD Initiated Contact with Patient 08/20/11 1709      Chief Complaint  Patient presents with  . Foot Injury    (Consider location/radiation/quality/duration/timing/severity/associated sxs/prior treatment) HPI Comments: Patient reports she was running in her front yard yesterday and tripped and fell.  States she fell with most of her weight landing onto the dorsal aspect of flexed toes.  Reports pain in her 3rd, 4th ,and 5th toes and forefoot of the left foot.  Denies hitting head, LOC.  Denies pain anywhere else or other injuries.    Patient is a 41 y.o. female presenting with foot injury. The history is provided by the patient.  Foot Injury  Pertinent negatives include no numbness.    History reviewed. No pertinent past medical history.  Past Surgical History  Procedure Date  . Eye surgery   . Abdominal hysterectomy   . Tubal ligation   . Eye surgery 10/16/10    No family history on file.  History  Substance Use Topics  . Smoking status: Current Some Day Smoker -- 0.5 packs/day    Types: Cigarettes  . Smokeless tobacco: Not on file  . Alcohol Use: Yes     occational    OB History    Grav Para Term Preterm Abortions TAB SAB Ect Mult Living                  Review of Systems  Musculoskeletal: Negative for back pain.  Skin: Negative for wound.  Neurological: Negative for syncope, weakness and numbness.    Allergies  Medroxyprogesterone acetate and Penicillins  Home Medications   Current Outpatient Rx  Name Route Sig Dispense Refill  . BC HEADACHE POWDER PO Oral Take 1 packet by mouth daily as needed. For pain    . ADULT MULTIVITAMIN W/MINERALS CH Oral Take 1 tablet by mouth daily.    Marland Kitchen OVER THE COUNTER MEDICATION Oral Take 2 tablets by mouth daily. Natural thyroid complex      BP 124/86  Pulse 80  Temp(Src) 98.3 F (36.8 C) (Oral)  Resp 18  Ht 5' 10.5" (1.791 m)  Wt 280 lb  (127.007 kg)  BMI 39.61 kg/m2  SpO2 98%  Physical Exam  Nursing note and vitals reviewed. Constitutional: She appears well-developed and well-nourished.  HENT:  Head: Normocephalic and atraumatic.  Neck: Neck supple.  Pulmonary/Chest: Effort normal.  Musculoskeletal:       Left ankle: Normal.       Left foot: She exhibits tenderness, bony tenderness and swelling. She exhibits normal capillary refill, no crepitus, no deformity and no laceration.       Feet:       Left foot with pulses intact, capillary refill < 2 seconds, sensation intact.    Neurological: She is alert.    ED Course  Procedures (including critical care time)  Labs Reviewed - No data to display Dg Foot Complete Left  08/20/2011  *RADIOLOGY REPORT*  Clinical Data: Anterior foot pain post fall  LEFT FOOT - COMPLETE 3+ VIEW  Comparison: None  Findings: Osseous mineralization normal. Transverse fractures at bases of proximal phalanges of the left third and fourth toes, minimally displaced. No intra-articular extension. Joint spaces preserved. No additional fracture or dislocation identified. Dorsal soft tissue swelling at forefoot.  IMPRESSION: Minimally displaced fractures at the proximal phalanges of the left third and fourth toes as above.  Original Report Authenticated By:  Lollie Marrow, M.D.   Pt offered crutches, declined.    1. Toes fractured       MDM  Patient with trip and impact to left toes last night.  Xray shows fractures of 3rd and 4th proximal phalanges.  Pt buddy taped and put in post op shoe.  Pain medication given in ED.  Pt declined crutches.  Pt d/c home with norco #20, ibuprofen, care instructions (RICE), return precautions, ortho follow up.  Patient verbalizes understanding and agrees with plan.         Dillard Cannon [place], [place] 08/20/11 1909

## 2011-08-20 NOTE — ED Notes (Signed)
Ortho tech paged for post op boot and buddy tape to 3rd and 4th toes

## 2011-08-20 NOTE — Discharge Instructions (Signed)
Read the information below.  Return to the ER if you have increased swelling, uncontrolled pain, are unable to move your toes, or if your toes turn very pale or very dark.  You may return to the ER at any time for worsening condition or any new symptoms that concern you.  Toe Fracture Your caregiver has diagnosed you as having a fractured toe. A toe fracture is a break in the bone of a toe. "Buddy taping" is a way of splinting your broken toe, by taping the broken toe to the toe next to it. This "buddy taping" will keep the injured toe from moving beyond normal range of motion. Buddy taping also helps the toe heal in a more normal alignment. It may take 6 to 8 weeks for the toe injury to heal. HOME CARE INSTRUCTIONS   Leave your toes taped together for as long as directed by your caregiver or until you see a doctor for a follow-up examination. You can change the tape after bathing. Always use a small piece of gauze or cotton between the toes when taping them together. This will help the skin stay dry and prevent infection.   Apply ice to the injury for 15 to 20 minutes each hour while awake for the first 2 days. Put the ice in a plastic bag and place a towel between the bag of ice and your skin.   After the first 2 days, apply heat to the injured area. Use heat for the next 2 to 3 days. Place a heating pad on the foot or soak the foot in warm water as directed by your caregiver.   Keep your foot elevated as much as possible to lessen swelling.   Wear sturdy, supportive shoes. The shoes should not pinch the toes or fit tightly against the toes.   Your caregiver may prescribe a rigid shoe if your foot is very swollen.   Your may be given crutches if the pain is too great and it hurts too much to walk.   Only take over-the-counter or prescription medicines for pain, discomfort, or fever as directed by your caregiver.   If your caregiver has given you a follow-up appointment, it is very important  to keep that appointment. Not keeping the appointment could result in a chronic or permanent injury, pain, and disability. If there is any problem keeping the appointment, you must call back to this facility for assistance.  SEEK MEDICAL CARE IF:   You have increased pain or swelling, not relieved with medications.   The pain does not get better after 1 week.   Your injured toe is cold when the others are warm.  SEEK IMMEDIATE MEDICAL CARE IF:   The toe becomes cold, numb, or white.   The toe becomes hot (inflamed) and red.  Document Released: 02/28/2000 Document Revised: 02/19/2011 Document Reviewed: 10/17/2007 [facility] Patient Information 2012 [place], [place].  Hard-Soled Shoe Use This is a flat, soft shoe with a hard (sometimes wood) sole. It is used for toe fractures and certain foot surgeries. Your doctor will tell you how much weight to put on your foot. HOME CARE INSTRUCTIONS   Lace the shoe to make it secure and comfortable. You do not want to feel pressure or rubbing on the painful area.   Follow instructions for wear as directed by your caregiver.  Document Released: 12/06/2003 Document Revised: 02/19/2011 Document Reviewed: 03/02/2005 [facility] Patient Information 2012 [place], [place].

## 2011-08-20 NOTE — ED Provider Notes (Signed)
Medical screening examination/treatment/procedure(s) were performed by non-physician practitioner and as supervising physician I was immediately available for consultation/collaboration.   Benny Lennert, MD 08/20/11 2001

## 2011-08-20 NOTE — ED Notes (Signed)
She fell last pm and now she has pain across her toes sl swollen

## 2011-08-20 NOTE — Progress Notes (Signed)
Orthopedic Tech Progress Note Patient Details:  [name] [birth year] [id number]  Ortho Devices Type of Ortho Device: Postop boot;Buddy tape Ortho Device/Splint Location: (L) LE Ortho Device/Splint Interventions: Application   Jennye Moccasin 08/20/2011, 6:31 PM

## 2011-11-02 ENCOUNTER — Emergency Department (HOSPITAL_COMMUNITY): Payer: Medicaid Other

## 2011-11-02 ENCOUNTER — Emergency Department (HOSPITAL_COMMUNITY)
Admission: EM | Admit: 2011-11-02 | Discharge: 2011-11-02 | Discharge status: Against Medical Advice | Payer: Medicaid Other | Attending: Emergency Medicine | Admitting: Emergency Medicine

## 2011-11-02 ENCOUNTER — Encounter (HOSPITAL_COMMUNITY): Payer: Self-pay | Admitting: *Deleted

## 2011-11-02 DIAGNOSIS — R05 Cough: Secondary | ICD-10-CM | POA: Insufficient documentation

## 2011-11-02 DIAGNOSIS — R0609 Other forms of dyspnea: Secondary | ICD-10-CM | POA: Insufficient documentation

## 2011-11-02 DIAGNOSIS — R0989 Other specified symptoms and signs involving the circulatory and respiratory systems: Secondary | ICD-10-CM | POA: Insufficient documentation

## 2011-11-02 DIAGNOSIS — R059 Cough, unspecified: Secondary | ICD-10-CM | POA: Insufficient documentation

## 2011-11-02 DIAGNOSIS — R1110 Vomiting, unspecified: Secondary | ICD-10-CM | POA: Insufficient documentation

## 2011-11-02 DIAGNOSIS — R079 Chest pain, unspecified: Secondary | ICD-10-CM | POA: Insufficient documentation

## 2011-11-02 DIAGNOSIS — R509 Fever, unspecified: Secondary | ICD-10-CM | POA: Insufficient documentation

## 2011-11-02 HISTORY — DX: Acute myocardial infarction, unspecified: I21.9

## 2011-11-02 HISTORY — DX: Anemia, unspecified: D64.9

## 2011-11-02 LAB — CBC WITH DIFFERENTIAL/PLATELET
Basophils Relative: 1 % (ref 0–1)
Eosinophils Absolute: 0.1 10*3/uL (ref 0.0–0.7)
HCT: 37.2 % (ref 36.0–46.0)
Hemoglobin: 12.4 g/dL (ref 12.0–15.0)
MCH: 28.5 pg (ref 26.0–34.0)
MCHC: 33.3 g/dL (ref 30.0–36.0)
Monocytes Absolute: 1.1 10*3/uL — ABNORMAL HIGH (ref 0.1–1.0)
Monocytes Relative: 12 % (ref 3–12)

## 2011-11-02 LAB — BASIC METABOLIC PANEL
BUN: 7 mg/dL (ref 6–23)
Creatinine, Ser: 0.91 mg/dL (ref 0.50–1.10)
GFR calc Af Amer: >90 mL/min (ref >90)
GFR calc non Af Amer: 79 mL/min — ABNORMAL LOW (ref >90)

## 2011-11-02 LAB — URINALYSIS, ROUTINE W REFLEX MICROSCOPIC
Bilirubin Urine: NEGATIVE
Ketones, ur: NEGATIVE mg/dL
Nitrite: NEGATIVE
Urobilinogen, UA: 1 mg/dL (ref 0.0–1.0)

## 2011-11-02 MED ORDER — IBUPROFEN 200 MG PO TABS
600.0000 mg | ORAL_TABLET | Freq: Once | ORAL | Status: AC
  Administered 2011-11-02: 600 mg via ORAL

## 2011-11-02 MED ORDER — IBUPROFEN 200 MG PO TABS
ORAL_TABLET | ORAL | Status: AC
  Filled 2011-11-02: qty 3

## 2011-11-02 NOTE — ED Notes (Signed)
PT is here with vomiting, fever, cough, and states hard to breath.  Pt sts she has productive cream color sputum. Pt states her chest is hurting and states mi in the past.

## 2011-11-03 ENCOUNTER — Encounter (HOSPITAL_COMMUNITY): Payer: Self-pay | Admitting: *Deleted

## 2011-11-03 ENCOUNTER — Emergency Department (HOSPITAL_COMMUNITY)
Admission: EM | Admit: 2011-11-03 | Discharge: 2011-11-03 | Disposition: A | Discharge status: Home / Self Care | Payer: Medicaid Other | Attending: Emergency Medicine | Admitting: Emergency Medicine

## 2011-11-03 DIAGNOSIS — M549 Dorsalgia, unspecified: Secondary | ICD-10-CM | POA: Insufficient documentation

## 2011-11-03 DIAGNOSIS — B349 Viral infection, unspecified: Secondary | ICD-10-CM

## 2011-11-03 DIAGNOSIS — R079 Chest pain, unspecified: Secondary | ICD-10-CM | POA: Insufficient documentation

## 2011-11-03 DIAGNOSIS — R42 Dizziness and giddiness: Secondary | ICD-10-CM | POA: Insufficient documentation

## 2011-11-03 DIAGNOSIS — R110 Nausea: Secondary | ICD-10-CM | POA: Insufficient documentation

## 2011-11-03 DIAGNOSIS — F17200 Nicotine dependence, unspecified, uncomplicated: Secondary | ICD-10-CM | POA: Insufficient documentation

## 2011-11-03 DIAGNOSIS — R0602 Shortness of breath: Secondary | ICD-10-CM | POA: Insufficient documentation

## 2011-11-03 DIAGNOSIS — R51 Headache: Secondary | ICD-10-CM | POA: Insufficient documentation

## 2011-11-03 HISTORY — DX: Bronchitis, not specified as acute or chronic: J40

## 2011-11-03 LAB — URINALYSIS, ROUTINE W REFLEX MICROSCOPIC
Bilirubin Urine: NEGATIVE
Hgb urine dipstick: NEGATIVE
Ketones, ur: 15 mg/dL — AB
Nitrite: NEGATIVE
pH: 8 (ref 5.0–8.0)

## 2011-11-03 LAB — BASIC METABOLIC PANEL
Calcium: 9.2 mg/dL (ref 8.4–10.5)
Creatinine, Ser: 1.01 mg/dL (ref 0.50–1.10)
GFR calc non Af Amer: 70 mL/min — ABNORMAL LOW (ref >90)
Glucose, Bld: 114 mg/dL — ABNORMAL HIGH (ref 70–99)
Sodium: 138 mEq/L (ref 135–145)

## 2011-11-03 LAB — POCT I-STAT TROPONIN I

## 2011-11-03 LAB — CBC
Hemoglobin: 12.4 g/dL (ref 12.0–15.0)
MCH: 28.1 pg (ref 26.0–34.0)
MCHC: 32.8 g/dL (ref 30.0–36.0)
Platelets: 263 10*3/uL (ref 150–400)

## 2011-11-03 MED ORDER — SODIUM CHLORIDE 0.9 % IV BOLUS (SEPSIS): 1000.0000 mL | Freq: Once | INTRAVENOUS | Status: DC

## 2011-11-03 MED ORDER — METOCLOPRAMIDE HCL 5 MG/ML IJ SOLN
10.0000 mg | Freq: Once | INTRAMUSCULAR | Status: AC
  Administered 2011-11-03: 10 mg via INTRAVENOUS
  Filled 2011-11-03: qty 2

## 2011-11-03 MED ORDER — KETOROLAC TROMETHAMINE 30 MG/ML IJ SOLN
30.0000 mg | Freq: Once | INTRAMUSCULAR | Status: AC
  Administered 2011-11-03: 30 mg via INTRAVENOUS
  Filled 2011-11-03: qty 1

## 2011-11-03 MED ORDER — ACETAMINOPHEN 325 MG PO TABS
650.0000 mg | ORAL_TABLET | Freq: Once | ORAL | Status: AC
  Administered 2011-11-03: 650 mg via ORAL
  Filled 2011-11-03 (×2): qty 2

## 2011-11-03 MED ORDER — ONDANSETRON 8 MG PO TBDP: 8.0000 mg | ORAL_TABLET | Freq: Three times a day (TID) | ORAL | Status: AC | PRN

## 2011-11-03 MED ORDER — PROMETHAZINE HCL 25 MG RE SUPP: 25.0000 mg | Freq: Four times a day (QID) | RECTAL | Status: DC | PRN

## 2011-11-03 MED ORDER — ONDANSETRON 4 MG PO TBDP
8.0000 mg | ORAL_TABLET | Freq: Once | ORAL | Status: AC
  Administered 2011-11-03: 8 mg via ORAL
  Filled 2011-11-03: qty 2

## 2011-11-03 MED ORDER — SODIUM CHLORIDE 0.9 % IV BOLUS (SEPSIS)
1000.0000 mL | Freq: Once | INTRAVENOUS | Status: AC
  Administered 2011-11-03: 1000 mL via INTRAVENOUS

## 2011-11-03 MED ORDER — IBUPROFEN 600 MG PO TABS: 600.0000 mg | ORAL_TABLET | Freq: Four times a day (QID) | ORAL | Status: AC | PRN

## 2011-11-03 NOTE — ED Notes (Signed)
Chest x-ray was done yesterday, 8/19.  Pt LWBS.

## 2011-11-03 NOTE — ED Notes (Addendum)
Pt coughing and vomiting after given zofran.  Tylenol held at this time due to vomiting.

## 2011-11-03 NOTE — ED Notes (Signed)
Patient arrives with generalized pain to all ext, head, chest.  Has had a fever since yesterday

## 2011-11-03 NOTE — ED Notes (Signed)
Pt c/o generalized body aches and CP since Friday.  C/o fevers (102.9 reportedly), n/v, back pain, ear pain, and HA.

## 2011-11-03 NOTE — ED Notes (Signed)
Pt resting with eyes closed.

## 2011-11-03 NOTE — ED Provider Notes (Addendum)
History     CSN: [id number]  Arrival date & time 11/03/11  0350   First MD Initiated Contact with Patient 11/03/11 [phone]      Chief Complaint  Patient presents with  . Chest Pain  . Back Pain    (Consider location/radiation/quality/duration/timing/severity/associated sxs/prior treatment) HPI Comments: 41 y/o female comes in with cc of generalized body aches - from head to toe x 2 days. Pt states that she has been feeling unwell, has some fatigue. Pt admits to some nausea, no emesis and sore throat with a cough that is non productive. Pt has no sick contacts. No previous hx of similar complains. Pt feels she is dehydrated, and is lightheaded and dizzy. She has a mild-moderate headache, constant - but there is no associated altered mental status, nuchal rigidity/pain. Pt is not diabetic/immunocompromised. No sick contacts. Pt noted to be febrile at arrival - states that she has been taking nothing for pain, fevers.  Patient is a 41 y.o. female presenting with chest pain and back pain. The history is provided by the patient.  Chest Pain Primary symptoms include nausea and vomiting. Pertinent negatives for primary symptoms include no shortness of breath and no abdominal pain.    Back Pain  Associated symptoms include chest pain and headaches. Pertinent negatives include no abdominal pain and no dysuria.    Past Medical History  Diagnosis Date  . Myocardial infarct   . Anemia   . Bronchitis     Past Surgical History  Procedure Date  . Eye surgery   . Abdominal hysterectomy   . Tubal ligation   . Eye surgery 10/16/10    History reviewed. No pertinent family history.  History  Substance Use Topics  . Smoking status: Current Some Day Smoker -- 0.5 packs/day    Types: Cigarettes  . Smokeless tobacco: Not on file  . Alcohol Use: Yes     occational    OB History    Grav Para Term Preterm Abortions TAB SAB Ect Mult Living                  Review of Systems    Constitutional: Positive for activity change.  HENT: Positive for sore throat. Negative for neck pain and neck stiffness.   Eyes: Positive for discharge.  Respiratory: Negative for shortness of breath.   Cardiovascular: Positive for chest pain.  Gastrointestinal: Positive for nausea and vomiting. Negative for abdominal pain.  Genitourinary: Negative for dysuria.  Musculoskeletal: Positive for myalgias and back pain.  Skin: Negative for rash.  Neurological: Positive for light-headedness and headaches.    Allergies  Medroxyprogesterone acetate and Penicillins  Home Medications   Current Outpatient Rx  Name Route Sig Dispense Refill  . ACETAMINOPHEN 325 MG PO TABS Oral Take 650 mg by mouth every 6 (six) hours as needed. For pain    . ALKA-SELTZER PLUS COLD PO Oral Take 1 tablet by mouth daily as needed. For cold symptoms    . ADULT MULTIVITAMIN W/MINERALS CH Oral Take 1 tablet by mouth daily.      BP 125/63  Pulse 103  Temp 100.3 F (37.9 C) (Oral)  Resp 16  SpO2 99%  Physical Exam  Nursing note and vitals reviewed. Constitutional: She is oriented to person, place, and time. She appears well-developed and well-nourished.  HENT:  Head: Normocephalic and atraumatic.  Mouth/Throat: Oropharyngeal exudate present.       Posterior pharynx and tonils appears normal, no erythema  Eyes: EOM are normal. Pupils  are equal, round, and reactive to light.  Neck: Normal range of motion. Neck supple.  Cardiovascular: Normal rate, regular rhythm and normal heart sounds.   No murmur heard. Pulmonary/Chest: Effort normal. No respiratory distress.  Abdominal: Soft. She exhibits no distension and no mass. There is no tenderness. There is no rebound and no guarding.       No splenomegaly  Lymphadenopathy:    She has cervical adenopathy.  Neurological: She is alert and oriented to person, place, and time.  Skin: Skin is warm and dry.    ED Course  Procedures (including critical care  time)  Labs Reviewed  CBC - Abnormal; Notable for the following:    WBC 11.5 (*)     All other components within normal limits  BASIC METABOLIC PANEL - Abnormal; Notable for the following:    Glucose, Bld 114 (*)     GFR calc non Af Amer 70 (*)     GFR calc Af Amer 81 (*)     All other components within normal limits  POCT I-STAT TROPONIN I  URINALYSIS, ROUTINE W REFLEX MICROSCOPIC   Dg Chest 2 View  11/02/2011  *RADIOLOGY REPORT*  Clinical Data: Fever, cough, shortness of breath  CHEST - 2 VIEW  Comparison: 02/23/2009  Findings: Normal heart size, mediastinal contours, and pulmonary vascularity. Lungs clear. Bones unremarkable. No pneumothorax.  IMPRESSION: No acute abnormalities.   Original Report Authenticated By: Lollie Marrow, M.D. ( 11/02/2011 13:06:25 )      No diagnosis found.    MDM  DDX includes: Viral syndrome Influenza Pharyngitis Sinusitis Mononucleosis Electrolyte abnormality  Young woman with no significant medical hx comes in with cc of generalized pain, fatigue - and noted to be febrile. No meningeal signs and no red flags on hx or exam suggesting severe systemic process. No evidence of mono with the sore throat, and patient is afebrile and has no splenomegaly. Centor criteria is 1 - for lymphadenopathy. In light of no pharyngeal exudates, and a Positive cough - changes of GBS low.  Will hydrate. Likely viral syndrome. Pt's sx on going for 2+ days, so no utility in checking for influenza - given tx will stay symtpmatic relief regardless of the findings.    Derwood Kaplan, MD 11/03/11 [phone]  Pt feels slightly better. Toradol brought temp down slightly. I reexamined the patient, and again no nuchal rigidity, and no visual complains, seizures, altered mental status, loss of consciousness, new weakness, or numbness, no gait instability. Ears look normal, throat looks fine. We will discharge post bolus.  Derwood Kaplan, MD 11/03/11 0730

## 2011-11-03 NOTE — ED Notes (Signed)
NAD noted at time of d/c home 

## 2012-01-29 ENCOUNTER — Encounter (HOSPITAL_COMMUNITY): Payer: Self-pay | Admitting: Emergency Medicine

## 2012-01-29 ENCOUNTER — Emergency Department (INDEPENDENT_AMBULATORY_CARE_PROVIDER_SITE_OTHER)
Admission: EM | Admit: 2012-01-29 | Discharge: 2012-01-29 | Disposition: A | Discharge status: Home / Self Care | Payer: Medicaid Other | Source: Home / Self Care | Attending: Emergency Medicine | Admitting: Emergency Medicine

## 2012-01-29 DIAGNOSIS — K529 Noninfective gastroenteritis and colitis, unspecified: Secondary | ICD-10-CM

## 2012-01-29 DIAGNOSIS — K5289 Other specified noninfective gastroenteritis and colitis: Secondary | ICD-10-CM

## 2012-01-29 NOTE — ED Notes (Signed)
Pt needs medical clearance so she can donate plasma... States that she has markings on her right arm where they had started an IV when she went to [facility] ER for flu like sx about a month ago... Pt denies any medical prob and is alert w/no signs of distress.

## 2012-01-29 NOTE — ED Provider Notes (Signed)
Chief Complaint  Patient presents with  . Medical Clearance    History of Present Illness:   The patient is a 41 year old female who comes in today for medical clearance to donate plasma. She was at the emergency room a month ago with a flulike illness with vomiting and fever. She came in on several consecutive days and was given IV rehydration. She was not admitted for overnight stay. The vomiting and diarrhea now cleared up. She has no GI symptoms right now including no abdominal pain, fever, chills, nausea, vomiting, or diarrhea. She feels fine otherwise with no nasal congestion, sore throat, cough, chest pain, shortness of breath, abdominal pain, or GYN complaints. She is otherwise healthy and is not taking any medications. Her only allergies are to penicillin and to Depo-Provera.  Review of Systems:  Other than noted above, the patient denies any of the following symptoms. Systemic:  No fever, chills, sweats, fatigue, myalgias, headache, or anorexia. Eye:  No redness, pain or drainage. ENT:  No earache, nasal congestion, rhinorrhea, sinus pressure, or sore throat. Lungs:  No cough, sputum production, wheezing, shortness of breath.  Cardiovascular:  No chest pain, palpitations, or syncope. GI:  No nausea, vomiting, abdominal pain or diarrhea. GU:  No dysuria, frequency, or hematuria. Skin:  No rash or pruritis.  PMFSH:  Past medical history, family history, social history, meds, and allergies were reviewed.   Physical Exam:   Vital signs:  BP 129/83  Pulse 80  Temp 97.9 F (36.6 C) (Oral)  Resp 22  SpO2 98% General:  Alert, in no distress. Eye:  PERRL, full EOMs.  Lids and conjunctivas were normal. ENT:  TMs and canals were normal, without erythema or inflammation.  Nasal mucosa was clear and uncongested, without drainage.  Mucous membranes were moist.  Pharynx was clear, without exudate or drainage.  There were no oral ulcerations or lesions. Neck:  Supple, no adenopathy, tenderness  or mass. Thyroid was normal. Lungs:  No respiratory distress.  Lungs were clear to auscultation, without wheezes, rales or rhonchi.  Breath sounds were clear and equal bilaterally. Heart:  Regular rhythm, without gallops, murmers or rubs. Abdomen:  Soft, flat, and non-tender to palpation.  No hepatosplenomagaly or mass. Skin:  Clear, warm, and dry, without rash or lesions.  Assessment:  The encounter diagnosis was Gastroenteritis.  Her flulike symptoms have cleared up completely, and there is no reason that she should not be able to donate plasma. She was given a note to this effect.  Plan:   1.  The following meds were prescribed:   New Prescriptions   No medications on file   2.  The patient was given a note for medical clearance for donating plasma.  3.  The patient was told to return if becoming worse in any way.    Reuben Likes, MD 01/29/12 [phone]

## 2012-02-03 IMAGING — US US TRANSVAGINAL NON-OB
1 series · 13 of 25 positions shown · non-contrast
Comparison: Prior pelvic ultrasound performed 01/26/2009, and CT
of the abdomen and pelvis performed 07/09/2008

CLINICAL DATA: Pelvic and left flank pain for 1 week.  History of
cervical cancer.



[Series 1: us transvaginal non-ob · 0.28mm/px · 13 of 40 slices shown]
[im 1/40]
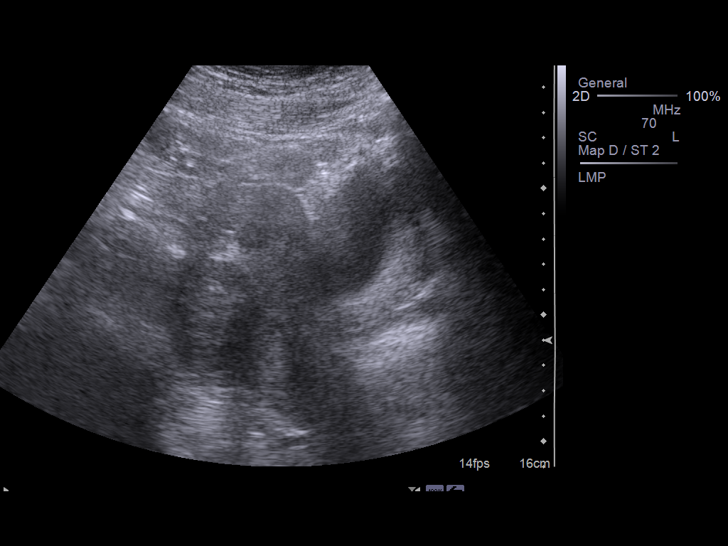
[im 4/40]
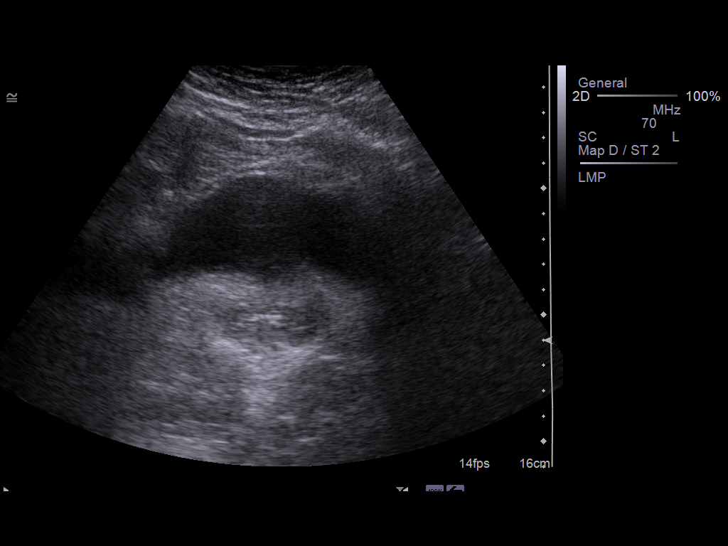
[im 7/40]
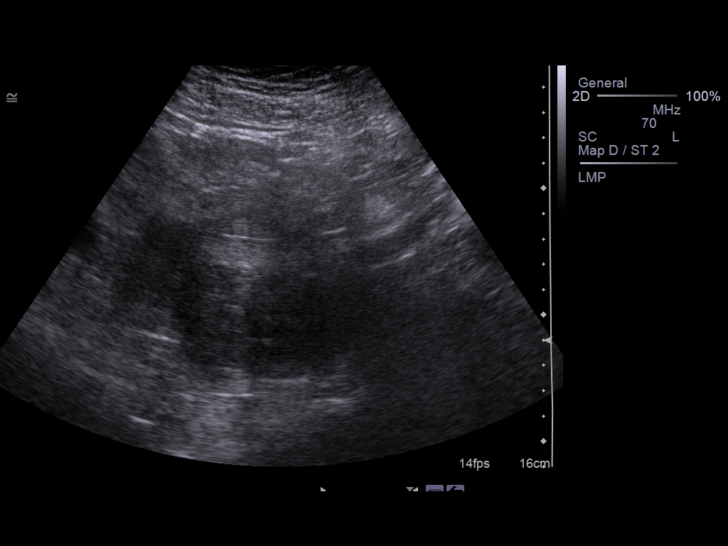
[im 10/40]
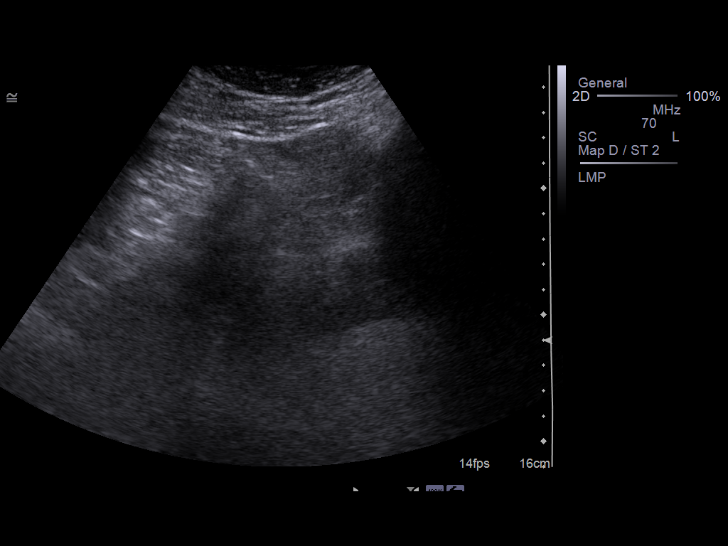
[im 14/40]
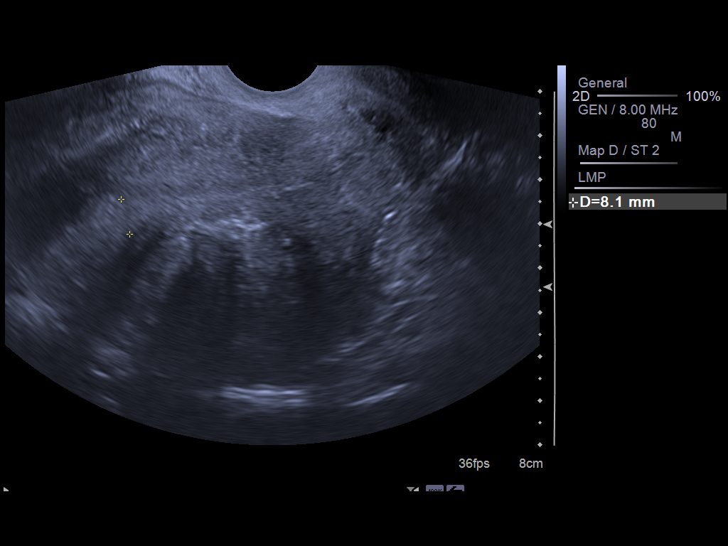
[im 17/40]
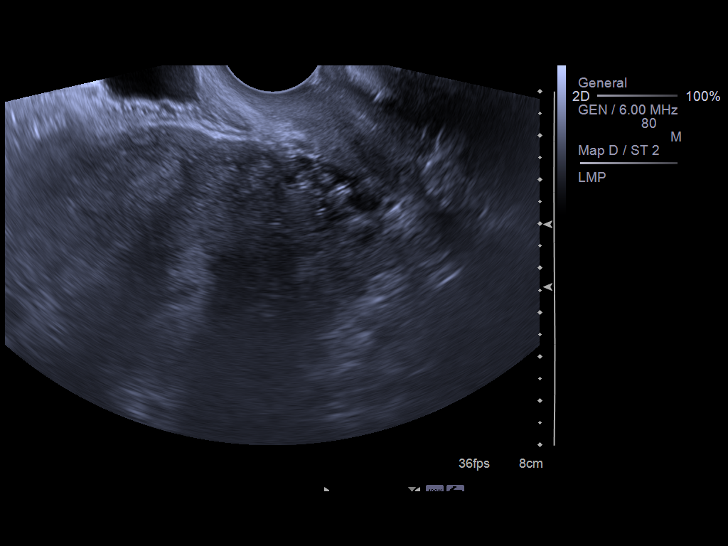
[im 20/40]
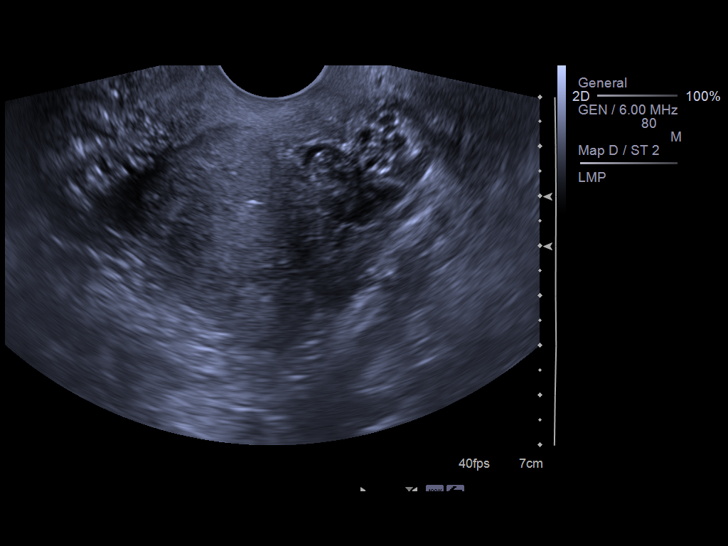
[im 23/40]
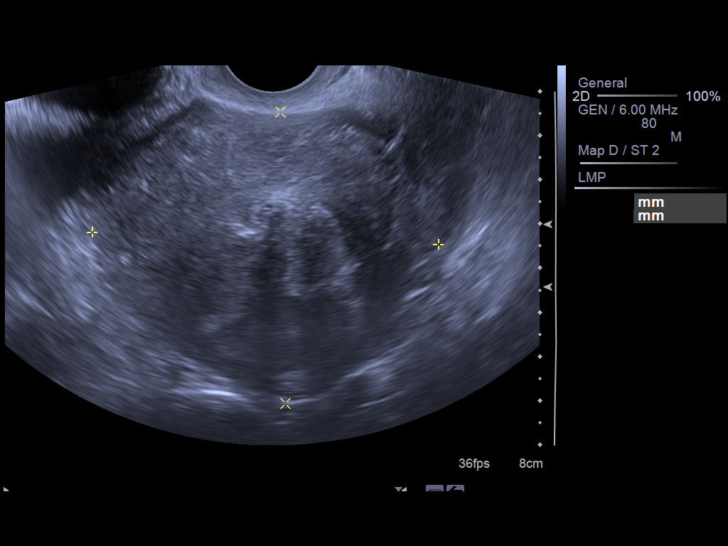
[im 27/40]
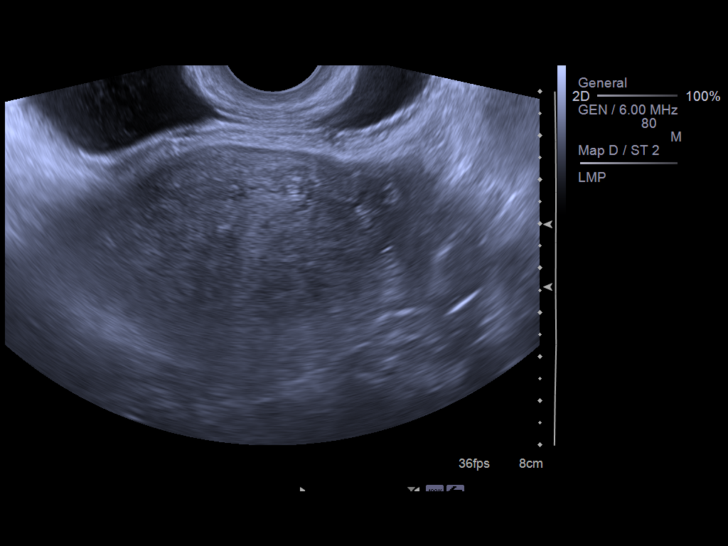
[im 30/40]
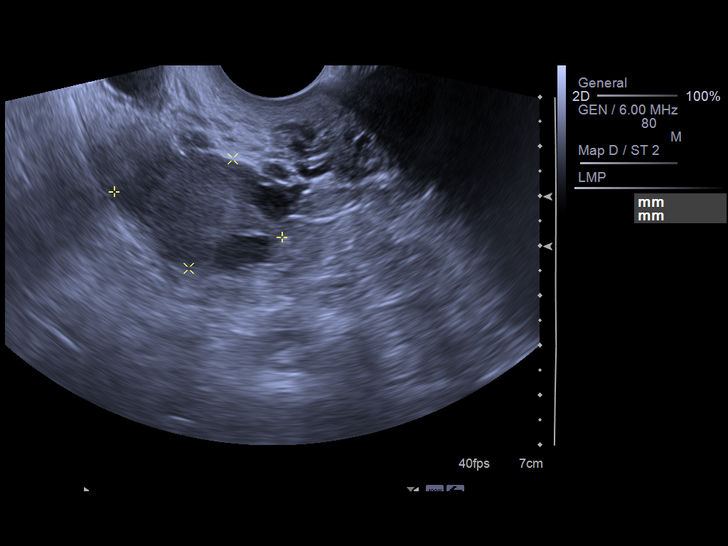
[im 33/40]
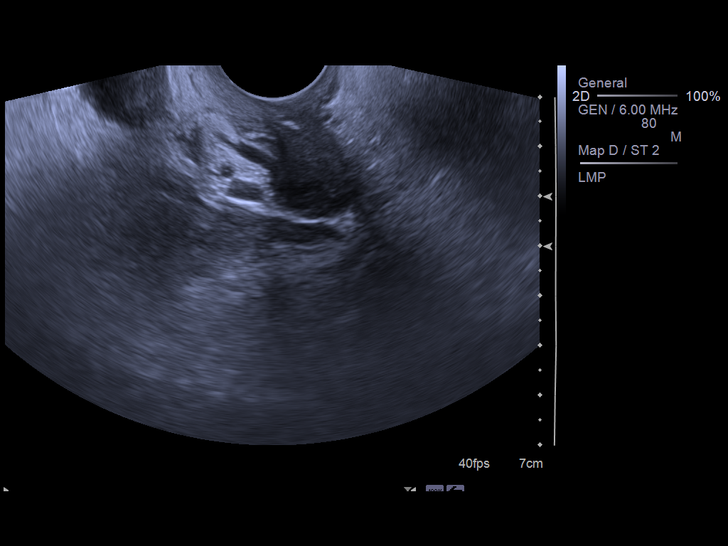
[im 36/40]
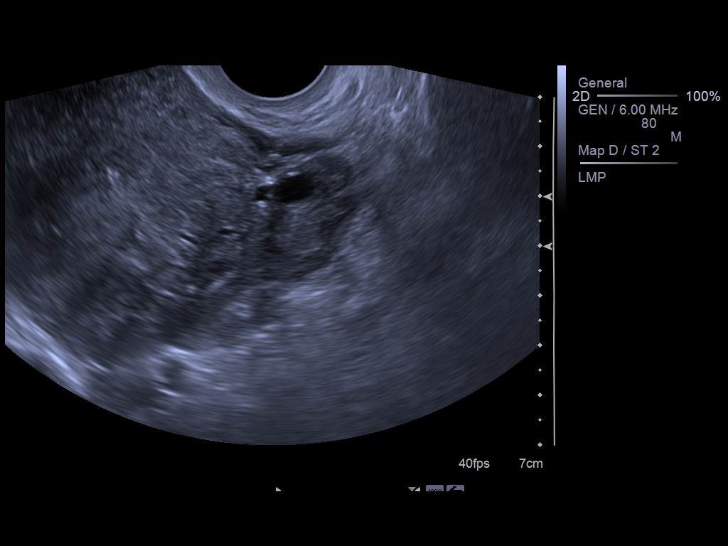
[im 40/40]
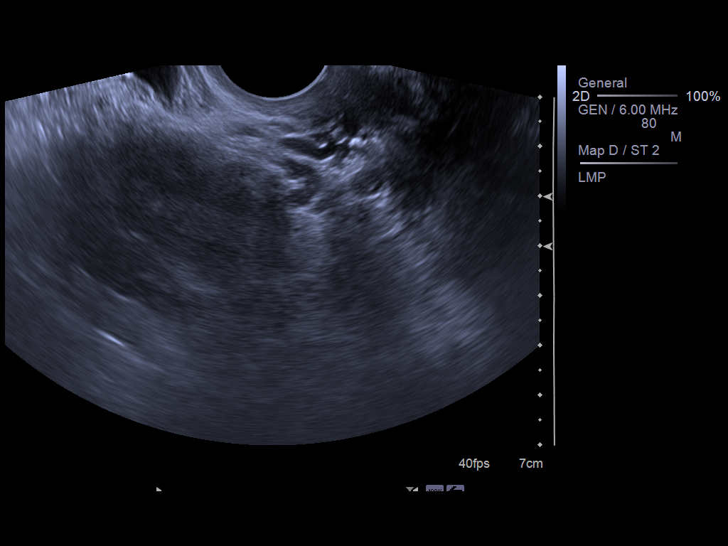

[13 of 25 positions shown; findings below may reference images not displayed]

FINDINGS: The uterus measures 10.8 cm in length, 6.6 cm in AP dimension and
7.9 cm in transverse dimension.  Two fibroids are identified
arising from the uterus, the larger of which is intramural along
the posterior wall of the uterus, measuring 4.4 x 4.2 cm. A smaller
2.0 cm fibroid is noted at the right side of the fundus.  The
endometrial echo complex remains within normal limits, measuring
approximately 1.3 cm in thickness.

A small Nabothian cyst is noted at the cervix; the lower uterine
segment appears grossly unremarkable, although it is difficult to
fully assess on provided images.

The ovaries are unremarkable in appearance.  The right ovary
measures 3.5 x 2.4 x 2.4 cm, while the left ovary measures 4.4 x
2.7 x 1.7 cm.  No suspicious adnexal masses are seen.  There is no
evidence of ovarian torsion.  Limited Doppler evaluation
demonstrates normal blood flow with respect to both ovaries.

No free fluid is seen within the pelvic cul-de-sac.
IMPRESSION: 1.  Fibroid uterus noted.
2.  Small Nabothian cyst noted.
3.  Otherwise unremarkable pelvic ultrasound.

## 2012-04-05 ENCOUNTER — Encounter (HOSPITAL_COMMUNITY): Payer: Self-pay | Admitting: *Deleted

## 2012-04-05 ENCOUNTER — Emergency Department (HOSPITAL_COMMUNITY)
Admission: EM | Admit: 2012-04-05 | Discharge: 2012-04-06 | Disposition: A | Discharge status: Home / Self Care | Payer: Medicaid Other | Attending: Emergency Medicine | Admitting: Emergency Medicine

## 2012-04-05 ENCOUNTER — Emergency Department (HOSPITAL_COMMUNITY): Payer: Medicaid Other

## 2012-04-05 DIAGNOSIS — J3489 Other specified disorders of nose and nasal sinuses: Secondary | ICD-10-CM | POA: Insufficient documentation

## 2012-04-05 DIAGNOSIS — R0989 Other specified symptoms and signs involving the circulatory and respiratory systems: Secondary | ICD-10-CM | POA: Insufficient documentation

## 2012-04-05 DIAGNOSIS — R0609 Other forms of dyspnea: Secondary | ICD-10-CM | POA: Insufficient documentation

## 2012-04-05 DIAGNOSIS — R079 Chest pain, unspecified: Secondary | ICD-10-CM | POA: Insufficient documentation

## 2012-04-05 DIAGNOSIS — R059 Cough, unspecified: Secondary | ICD-10-CM | POA: Insufficient documentation

## 2012-04-05 DIAGNOSIS — Z8709 Personal history of other diseases of the respiratory system: Secondary | ICD-10-CM | POA: Insufficient documentation

## 2012-04-05 DIAGNOSIS — Z3202 Encounter for pregnancy test, result negative: Secondary | ICD-10-CM | POA: Insufficient documentation

## 2012-04-05 DIAGNOSIS — R05 Cough: Secondary | ICD-10-CM

## 2012-04-05 DIAGNOSIS — R0600 Dyspnea, unspecified: Secondary | ICD-10-CM

## 2012-04-05 DIAGNOSIS — Z862 Personal history of diseases of the blood and blood-forming organs and certain disorders involving the immune mechanism: Secondary | ICD-10-CM | POA: Insufficient documentation

## 2012-04-05 DIAGNOSIS — Z87891 Personal history of nicotine dependence: Secondary | ICD-10-CM | POA: Insufficient documentation

## 2012-04-05 DIAGNOSIS — I252 Old myocardial infarction: Secondary | ICD-10-CM | POA: Insufficient documentation

## 2012-04-05 LAB — COMPREHENSIVE METABOLIC PANEL
ALT: 12 U/L (ref 0–35)
Alkaline Phosphatase: 84 U/L (ref 39–117)
BUN: 14 mg/dL (ref 6–23)
CO2: 25 mEq/L (ref 19–32)
Calcium: 9.2 mg/dL (ref 8.4–10.5)
GFR calc Af Amer: >90 mL/min (ref >90)
GFR calc non Af Amer: >90 mL/min (ref >90)
Glucose, Bld: 91 mg/dL (ref 70–99)
Potassium: 3.5 mEq/L (ref 3.5–5.1)
Sodium: 139 mEq/L (ref 135–145)
Total Protein: 7.5 g/dL (ref 6.0–8.3)

## 2012-04-05 LAB — CBC WITH DIFFERENTIAL/PLATELET
Eosinophils Absolute: 0.5 10*3/uL (ref 0.0–0.7)
Eosinophils Relative: 7 % — ABNORMAL HIGH (ref 0–5)
HCT: 35.3 % — ABNORMAL LOW (ref 36.0–46.0)
Hemoglobin: 11.8 g/dL — ABNORMAL LOW (ref 12.0–15.0)
Lymphocytes Relative: 47 % — ABNORMAL HIGH (ref 12–46)
Lymphs Abs: 3.2 10*3/uL (ref 0.7–4.0)
MCH: 28.9 pg (ref 26.0–34.0)
MCV: 86.3 fL (ref 78.0–100.0)
Monocytes Relative: 7 % (ref 3–12)
RBC: 4.09 MIL/uL (ref 3.87–5.11)
WBC: 6.8 10*3/uL (ref 4.0–10.5)

## 2012-04-05 MED ORDER — IPRATROPIUM BROMIDE 0.02 % IN SOLN
0.5000 mg | RESPIRATORY_TRACT | Status: DC
  Administered 2012-04-06: 0.5 mg via RESPIRATORY_TRACT
  Filled 2012-04-05: qty 2.5

## 2012-04-05 MED ORDER — ALBUTEROL SULFATE (5 MG/ML) 0.5% IN NEBU
2.5000 mg | INHALATION_SOLUTION | RESPIRATORY_TRACT | Status: DC
  Administered 2012-04-06: 2.5 mg via RESPIRATORY_TRACT
  Filled 2012-04-05: qty 0.5

## 2012-04-05 NOTE — ED Provider Notes (Signed)
History     CSN: [id number]  Arrival date & time 04/05/12  2232   First MD Initiated Contact with Patient 04/05/12 2330      Chief Complaint  Patient presents with  . Shortness of Breath    (Consider location/radiation/quality/duration/timing/severity/associated sxs/prior treatment) HPIDina Brown is a 42 y.o. female with a history of bronchitis and MI it was thought to be caused by severe anemia who had a clean catheterization a urine after ago, presents today with shortness of breath and severe cough. This started about 2 or 3 days ago, it is constant, it is severe, she's short of breath whether or not she's doing anything or standing still. She also has a sharp chest pain in the middle of her back it is worse when she takes a deep breath and worse on coughing. She's also having some chest pain that is intermittent, very similar to her back pain, sharp and worse on coughing it is described as 8/10 in intensity. There's been some mucus streaked with blood but no frank hemoptysis. No history of venous thromboembolism.  Patient's shortness of breath is also more severe when she lays down on the right. Denies any fevers or chills, has had some rhinorrhea.  Patient is a former smoker-she quit smoking cigarettes 3 weeks ago.  Past Medical History  Diagnosis Date  . Myocardial infarct   . Anemia   . Bronchitis     Past Surgical History  Procedure Date  . Eye surgery   . Abdominal hysterectomy   . Tubal ligation   . Eye surgery 10/16/10    History reviewed. No pertinent family history.  History  Substance Use Topics  . Smoking status: Former Smoker -- 0.5 packs/day    Types: Cigarettes    Quit date: 03/15/2012  . Smokeless tobacco: Not on file  . Alcohol Use: Yes     Comment: occational    OB History    Grav Para Term Preterm Abortions TAB SAB Ect Mult Living                  Review of Systems At least 10pt or greater review of systems completed and are negative except  where specified in the HPI.  Allergies  Medroxyprogesterone acetate; Pork-derived products; and Penicillins  Home Medications   Current Outpatient Rx  Name  Route  Sig  Dispense  Refill  . ACETAMINOPHEN 325 MG PO TABS   Oral   Take 650 mg by mouth every 6 (six) hours as needed. For pain         . ALKA-SELTZER PLUS COLD PO   Oral   Take 1 tablet by mouth daily as needed. For cold symptoms         . DIPHENHYDRAMINE HCL 25 MG PO TABS   Oral   Take 25 mg by mouth at bedtime as needed. For allergy           BP 141/76  Pulse 69  Temp 97.7 F (36.5 C) (Oral)  Resp 20  SpO2 100%  Physical Exam  Nursing notes reviewed.  Electronic medical record reviewed. VITAL SIGNS:   Filed Vitals:   04/05/12 2238 04/05/12 2355 04/05/12 2357  BP: 141/76 130/80   Pulse: 69 70   Temp: 97.7 F (36.5 C) 98.1 F (36.7 C)   TempSrc: Oral Oral   Resp: 20 16   Height:  6' (1.829 m)   Weight:  274 lb (124.286 kg)   SpO2: 100% 100% 100%  CONSTITUTIONAL: Awake, oriented, appears non-toxic HENT: Atraumatic, normocephalic, oral mucosa pink and moist, airway patent. Nares patent, mildly inflamed nasal turbinates with clear nasal discharge. External ears normal. EYES: Conjunctiva clear, EOMI, PERRLA NECK: Trachea midline, non-tender, supple CARDIOVASCULAR: Normal heart rate, Normal rhythm, No murmurs, rubs, gallops PULMONARY/CHEST: Clear to auscultation, no rhonchi, wheezes, or rales. Symmetrical breath sounds. Non-tender. ABDOMINAL: Non-distended, obese, soft, non-tender - no rebound or guarding.  BS normal. NEUROLOGIC: Non-focal, moving all four extremities, no gross sensory or motor deficits. EXTREMITIES: No clubbing, cyanosis, or edema SKIN: Warm, Dry, No erythema, No rash  ED Course  Procedures (including critical care time)  Date: 04/06/2012  Rate: 82  Rhythm: normal sinus rhythm  QRS Axis: normal  Intervals: normal  ST/T Wave abnormalities: normal  Conduction Disutrbances:  none  Narrative Interpretation: unremarkable - no significant changes when compared with prior EKG dated 11/03/2011 (prior EKG shows sinus tachycardia)  Labs Reviewed  URINALYSIS, ROUTINE W REFLEX MICROSCOPIC - Abnormal; Notable for the following:    APPearance CLOUDY (*)     All other components within normal limits  CBC WITH DIFFERENTIAL - Abnormal; Notable for the following:    Hemoglobin 11.8 (*)     HCT 35.3 (*)     Neutrophils Relative 38 (*)     Lymphocytes Relative 47 (*)     Eosinophils Relative 7 (*)     All other components within normal limits  COMPREHENSIVE METABOLIC PANEL - Abnormal; Notable for the following:    Total Bilirubin 0.2 (*)     All other components within normal limits  D-DIMER, QUANTITATIVE  POCT I-STAT TROPONIN I  POCT PREGNANCY, URINE   Dg Chest 2 View  04/05/2012  *RADIOLOGY REPORT*  Clinical Data: Shortness of breath with dry cough and chest pain  CHEST - 2 VIEW  Comparison: 11/02/2011  Findings: The heart size and vascular pattern are normal.  The lungs are clear.  There are no effusions.  IMPRESSION: Normal study   Original Report Authenticated By: Esperanza Heir, M.D.      1. Dyspnea   2. Cough   3. Influenza-like illness      Medications  diphenhydrAMINE (BENADRYL) 25 MG tablet (not administered)  ipratropium (ATROVENT) nebulizer solution 0.5 mg (0.5 mg Nebulization Given 04/06/12 0005)    And  albuterol (PROVENTIL) (5 MG/ML) 0.5% nebulizer solution 2.5 mg (2.5 mg Nebulization Given 04/06/12 0005)  albuterol (PROVENTIL HFA;VENTOLIN HFA) 108 (90 BASE) MCG/ACT inhaler 2 puff (not administered)  AEROCHAMBER PLUS FLO-VU LARGE MISC 1 each (not administered)  chlorpheniramine-HYDROcodone (TUSSIONEX PENNKINETIC ER) 10-8 MG/5ML LQCR (not administered)     MDM  [name] is a 42 y.o. female to the emergency department with a cough, rhinorrhea and shortness of breath with pleuritic chest pain. Low suspicion for pulmonary embolism, and so obtain a  d-dimer which was negative. EKG is also within normal limits, troponin is negative, labs are unremarkable. Patient's chest x-ray is clear. This is likely a viral-like illness, perhaps influenza does cause bronchospasm and a bad cough. We'll give the patient some cough medicine, and informed her is difficult to suppress cough she also uses honey at home too.  We've given the patient a albuterol inhaler plus spacer for cough and shortness of breath.    I explained the diagnosis and have given explicit precautions to return to the ER including worsening shortness of breath, worsening chest pain, dizziness, lightheadedness, hemoptysis or any other new or worsening symptoms. The patient understands and accepts the medical plan  as it's been dictated and I have answered their questions. Discharge instructions concerning home care and prescriptions have been given.  The patient is STABLE and is discharged to home in good condition.          Jones Skene, MD 04/06/12 [id number]

## 2012-04-05 NOTE — ED Notes (Signed)
Pt states that she is having difficulty breathing for the past 2 -3 days. Pt states that she can be sitting still or with excertion she can start having SOB. Pt states that the pain she feels gets worse in her back when she takes a deep breath.

## 2012-04-06 LAB — URINALYSIS, ROUTINE W REFLEX MICROSCOPIC
Bilirubin Urine: NEGATIVE
Glucose, UA: NEGATIVE mg/dL
Hgb urine dipstick: NEGATIVE
Specific Gravity, Urine: 1.027 (ref 1.005–1.030)
pH: 6.5 (ref 5.0–8.0)

## 2012-04-06 LAB — POCT PREGNANCY, URINE: Preg Test, Ur: NEGATIVE

## 2012-04-06 LAB — D-DIMER, QUANTITATIVE: D-Dimer, Quant: <0.27 ug/mL-FEU (ref 0.00–0.48)

## 2012-04-06 MED ORDER — AEROCHAMBER PLUS FLO-VU LARGE MISC
1.0000 | Freq: Once | Status: DC
  Filled 2012-04-06: qty 1

## 2012-04-06 MED ORDER — HYDROCOD POLST-CHLORPHEN POLST 10-8 MG/5ML PO LQCR: 5.0000 mL | Freq: Two times a day (BID) | ORAL | Status: DC | PRN

## 2012-04-06 MED ORDER — ALBUTEROL SULFATE HFA 108 (90 BASE) MCG/ACT IN AERS
2.0000 | INHALATION_SPRAY | RESPIRATORY_TRACT | Status: DC | PRN
  Administered 2012-04-06: 2 via RESPIRATORY_TRACT
  Filled 2012-04-06: qty 6.7

## 2012-06-09 ENCOUNTER — Emergency Department (HOSPITAL_COMMUNITY)
Admission: EM | Admit: 2012-06-09 | Discharge: 2012-06-10 | Disposition: A | Discharge status: Home / Self Care | Payer: Medicaid Other | Attending: Emergency Medicine | Admitting: Emergency Medicine

## 2012-06-09 ENCOUNTER — Encounter (HOSPITAL_COMMUNITY): Payer: Self-pay | Admitting: Emergency Medicine

## 2012-06-09 DIAGNOSIS — I252 Old myocardial infarction: Secondary | ICD-10-CM | POA: Insufficient documentation

## 2012-06-09 DIAGNOSIS — Z862 Personal history of diseases of the blood and blood-forming organs and certain disorders involving the immune mechanism: Secondary | ICD-10-CM | POA: Insufficient documentation

## 2012-06-09 DIAGNOSIS — R569 Unspecified convulsions: Secondary | ICD-10-CM | POA: Insufficient documentation

## 2012-06-09 DIAGNOSIS — F445 Conversion disorder with seizures or convulsions: Secondary | ICD-10-CM

## 2012-06-09 DIAGNOSIS — Z87891 Personal history of nicotine dependence: Secondary | ICD-10-CM | POA: Insufficient documentation

## 2012-06-09 DIAGNOSIS — Z8709 Personal history of other diseases of the respiratory system: Secondary | ICD-10-CM | POA: Insufficient documentation

## 2012-06-09 MED ORDER — BENZTROPINE MESYLATE 1 MG/ML IJ SOLN
1.0000 mg | Freq: Once | INTRAMUSCULAR | Status: AC
  Administered 2012-06-09: 1 mg via INTRAVENOUS
  Filled 2012-06-09: qty 2

## 2012-06-09 MED ORDER — DIPHENHYDRAMINE HCL 50 MG/ML IJ SOLN
25.0000 mg | Freq: Once | INTRAMUSCULAR | Status: AC
  Administered 2012-06-09: 25 mg via INTRAVENOUS
  Filled 2012-06-09: qty 1

## 2012-06-09 MED ORDER — LORAZEPAM 2 MG/ML IJ SOLN
1.0000 mg | Freq: Once | INTRAMUSCULAR | Status: AC
  Administered 2012-06-09: 1 mg via INTRAVENOUS
  Filled 2012-06-09: qty 1

## 2012-06-09 NOTE — ED Notes (Signed)
Patient with possible focal seizure.  Patient with reported 10 minutes of shaking.  Patient did have argument with children all day.  Patient did receive 2.5mg  of Versed by EMS.  Patient is talking and walking during shaking.

## 2012-06-09 NOTE — ED Provider Notes (Signed)
History     CSN: [id number]  Arrival date & time 06/09/12  2246   First MD Initiated Contact with Patient 06/09/12 2301      Chief Complaint  Patient presents with  . Seizures    (Consider location/radiation/quality/duration/timing/severity/associated sxs/prior treatment) HPI Comments: Patient from home with "seizure" activity for the past hour.  She reports twitching of her head and neck.  She has had similar symptoms when she was on anxiety medication. She reports no history of seizure disorder. She was given 2.5 mg of Versed by EMS without relief. Denies any headache, chest pain, shortness of breath abdominal pain, nausea vomiting or fever. Denies any recent medication changes. Denies any alcohol or drug abuse.  The history is provided by the patient.    Past Medical History  Diagnosis Date  . Myocardial infarct   . Anemia   . Bronchitis     Past Surgical History  Procedure Laterality Date  . Eye surgery    . Abdominal hysterectomy    . Tubal ligation    . Eye surgery  10/16/10    No family history on file.  History  Substance Use Topics  . Smoking status: Former Smoker -- 0.50 packs/day    Types: Cigarettes    Quit date: 03/15/2012  . Smokeless tobacco: Not on file  . Alcohol Use: Yes     Comment: occational    OB History   Grav Para Term Preterm Abortions TAB SAB Ect Mult Living                  Review of Systems  Constitutional: Negative for fever, activity change and appetite change.  HENT: Negative for congestion and rhinorrhea.   Respiratory: Negative for chest tightness.   Cardiovascular: Negative for chest pain.  Gastrointestinal: Negative for nausea, vomiting and abdominal pain.  Genitourinary: Negative for dysuria, vaginal bleeding and vaginal discharge.  Musculoskeletal: Negative for back pain.  Skin: Negative for rash.  Neurological: Positive for seizures. Negative for dizziness and headaches.  A complete 10 system review of systems was  obtained and all systems are negative except as noted in the HPI and PMH.    Allergies  Medroxyprogesterone acetate; Pork-derived products; and Penicillins  Home Medications   Current Outpatient Rx  Name  Route  Sig  Dispense  Refill  . naproxen sodium (ANAPROX) 220 MG tablet   Oral   Take 220 mg by mouth 2 (two) times daily as needed (for pain).           BP 102/55  Pulse 71  Temp(Src) 99.1 F (37.3 C) (Oral)  Resp 17  SpO2 99%  Physical Exam  Constitutional: She is oriented to person, place, and time. She appears well-developed and well-nourished. No distress.  Patient with active twitching activity of her head and neck speaking in full sentences  HENT:  Head: Normocephalic and atraumatic.  Mouth/Throat: Oropharynx is clear and moist. No oropharyngeal exudate.  Eyes: Conjunctivae and EOM are normal. Pupils are equal, round, and reactive to light.  Neck: Normal range of motion. Neck supple.  No meningismus  Cardiovascular: Normal rate, regular rhythm and normal heart sounds.   No murmur heard. Pulmonary/Chest: Effort normal. No respiratory distress.  Abdominal: There is no tenderness. There is no rebound and no guarding.  Musculoskeletal: Normal range of motion. She exhibits no edema and no tenderness.  Neurological: She is alert and oriented to person, place, and time. No cranial nerve deficit. She exhibits normal muscle tone. Coordination  normal.  CN 2-12 intact, 5/5 strength throughout, twitching of head and neck.  Skin: Skin is warm.    ED Course  Procedures (including critical care time)  Labs Reviewed  CBC WITH DIFFERENTIAL - Abnormal; Notable for the following:    Hemoglobin 11.9 (*)    All other components within normal limits  COMPREHENSIVE METABOLIC PANEL  PREGNANCY, URINE  URINALYSIS, ROUTINE W REFLEX MICROSCOPIC   Ct Head Wo Contrast  06/10/2012  *RADIOLOGY REPORT*  Clinical Data: Seizure.  CT HEAD WITHOUT CONTRAST  Technique:  Contiguous axial  images were obtained from the base of the skull through the vertex without contrast.  Comparison: 10/12/2010.  Findings: The age advanced cerebral atrophy.  The ventricles are normal.  No extra-axial fluid collections.  No CT findings for acute hemispheric infarction and/or intracranial hemorrhage.  No mass lesions.  The bony structures are intact.  The paranasal sinuses and mastoid air cells are clear.  IMPRESSION: Mild age advanced cerebral atrophy. No acute intracranial findings or mass lesions.   Original Report Authenticated By: Rudie Meyer, M.D.      1. Pseudoseizures       MDM  Twitching activity of head and neck that is not consistent with seizure. Patient is awake, alert and oriented and answering questions throughout this activity. She can voluntarily stop it.  Labs unremarkable. CT head negative. Patient's symptoms improved medication.  History and exam consistent with pseudoseizures. Patient and family reassured. Will followup with neurology.     Date: 06/09/2012  Rate: 76  Rhythm: normal sinus rhythm  QRS Axis: normal  Intervals: normal  ST/T Wave abnormalities: normal  Conduction Disutrbances:none  Narrative Interpretation:   Old EKG Reviewed: unchanged     Glynn Octave, MD 06/10/12 [phone]

## 2012-06-10 ENCOUNTER — Encounter (HOSPITAL_COMMUNITY): Payer: Self-pay | Admitting: Radiology

## 2012-06-10 ENCOUNTER — Emergency Department (HOSPITAL_COMMUNITY): Payer: Medicaid Other

## 2012-06-10 LAB — COMPREHENSIVE METABOLIC PANEL
AST: 21 U/L (ref 0–37)
Albumin: 3.7 g/dL (ref 3.5–5.2)
BUN: 13 mg/dL (ref 6–23)
Calcium: 9.3 mg/dL (ref 8.4–10.5)
Creatinine, Ser: 0.8 mg/dL (ref 0.50–1.10)
Total Bilirubin: 0.3 mg/dL (ref 0.3–1.2)
Total Protein: 7.6 g/dL (ref 6.0–8.3)

## 2012-06-10 LAB — CBC WITH DIFFERENTIAL/PLATELET
Basophils Absolute: 0 10*3/uL (ref 0.0–0.1)
Basophils Relative: 1 % (ref 0–1)
Eosinophils Absolute: 0.4 10*3/uL (ref 0.0–0.7)
Eosinophils Relative: 5 % (ref 0–5)
HCT: 36.5 % (ref 36.0–46.0)
Hemoglobin: 11.9 g/dL — ABNORMAL LOW (ref 12.0–15.0)
MCH: 28.3 pg (ref 26.0–34.0)
MCHC: 32.6 g/dL (ref 30.0–36.0)
Monocytes Absolute: 0.5 10*3/uL (ref 0.1–1.0)
Monocytes Relative: 7 % (ref 3–12)
RDW: 12.6 % (ref 11.5–15.5)

## 2012-07-12 IMAGING — US US PELVIS COMPLETE MODIFY
1 series · 13 of 25 positions shown · non-contrast
Comparison: 09/01/2009

CLINICAL DATA: 39-year-old with right sided abdominal pain.

TRANSABDOMINAL ULTRASOUND OF PELVIS
TECHNIQUE: Transabdominal ultrasound examination of the pelvis was
performed including evaluation of the uterus, ovaries, adnexal
regions, and pelvic cul-de-sac.

[Series 1: us pelvis complete modify · 0.33mm/px · 13 of 43 slices shown]
[im 1/43]
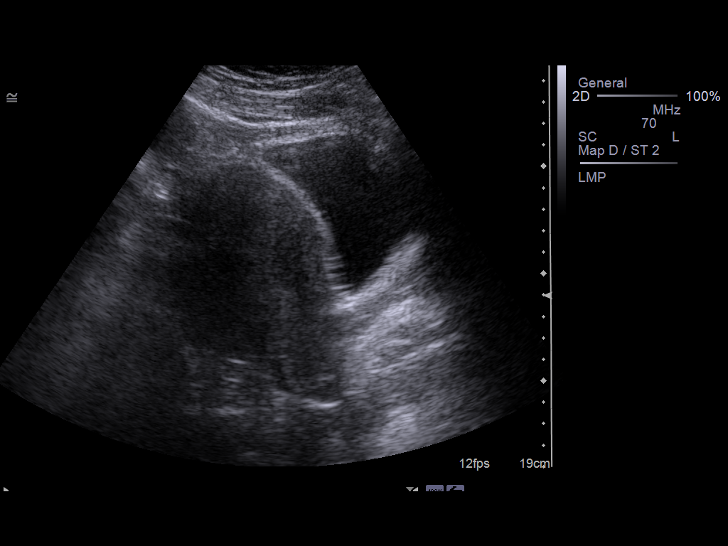
[im 4/43]
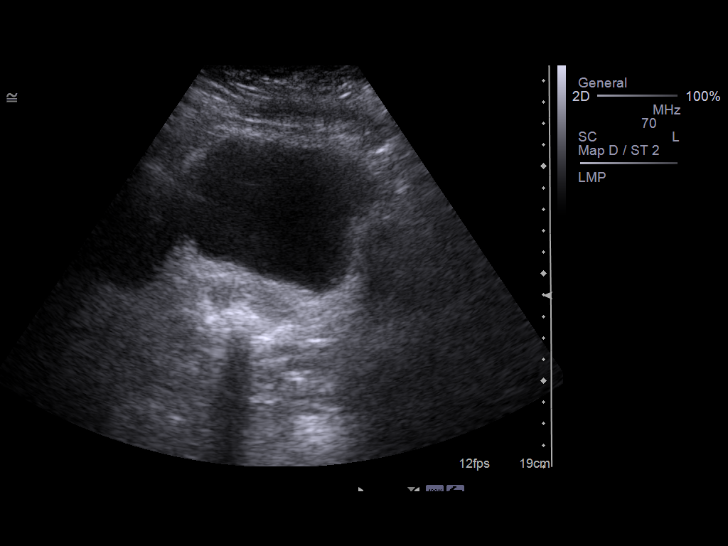
[im 8/43]
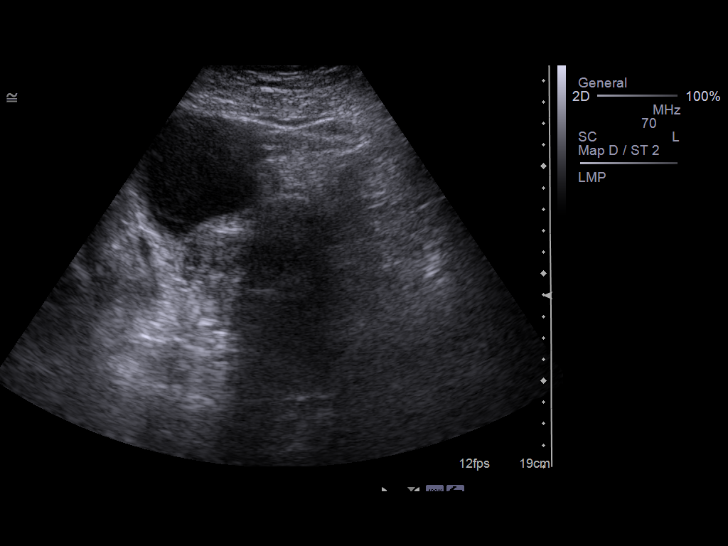
[im 11/43]
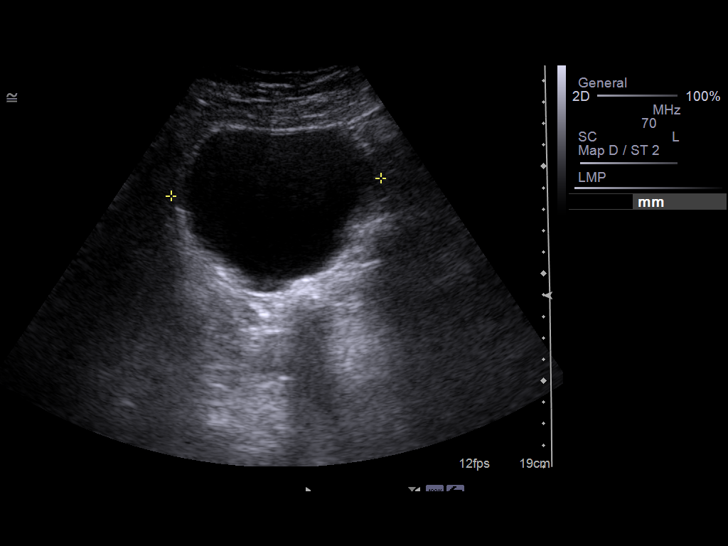
[im 15/43]
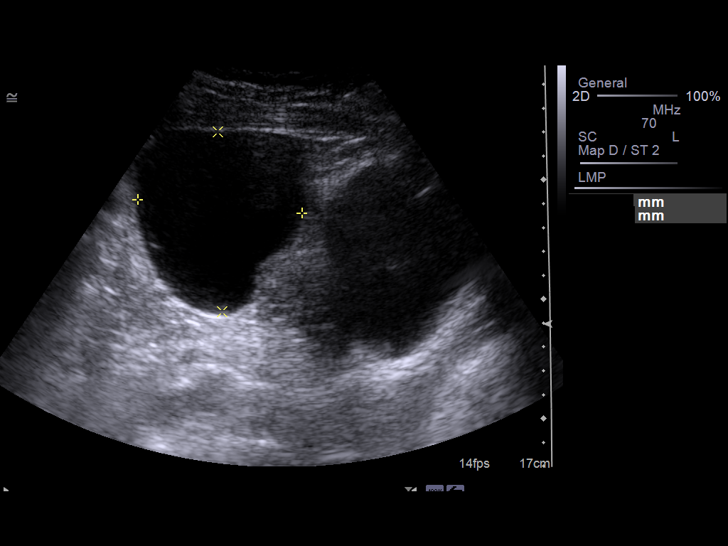
[im 18/43]
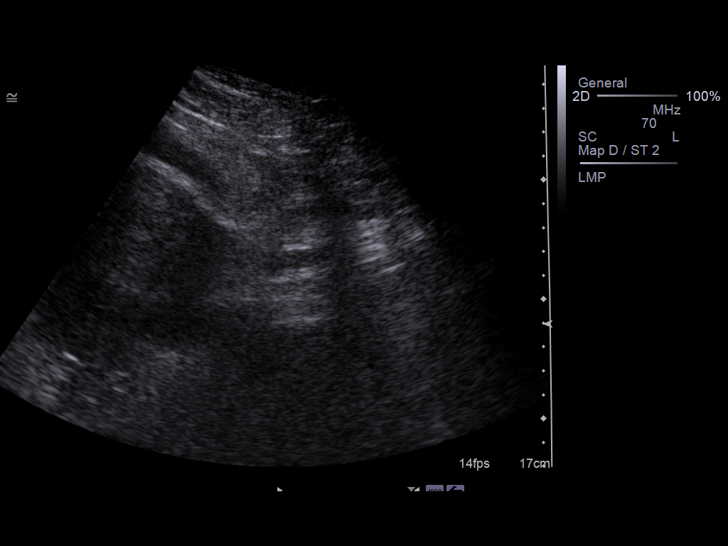
[im 22/43]
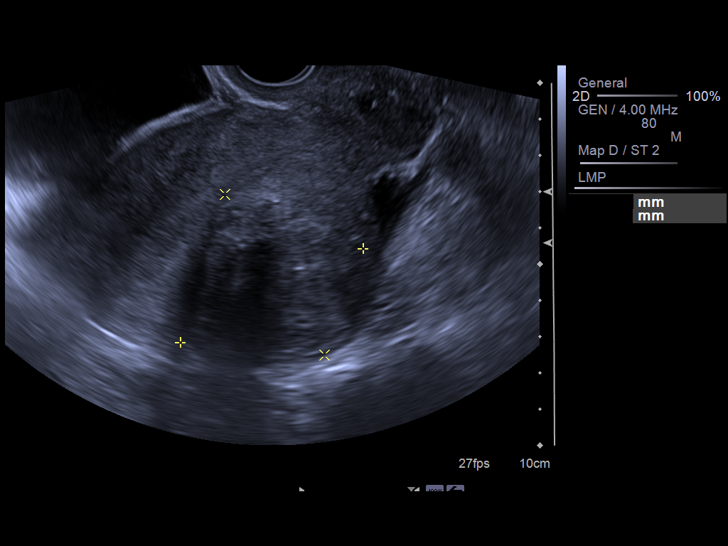
[im 25/43]
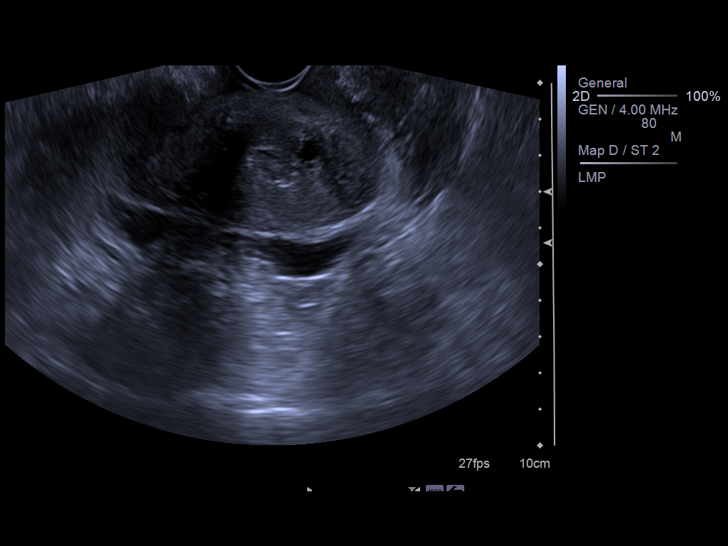
[im 29/43]
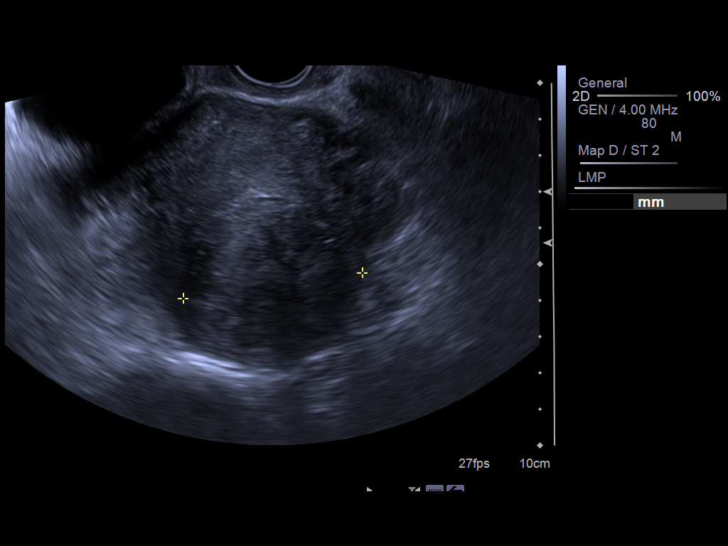
[im 32/43]
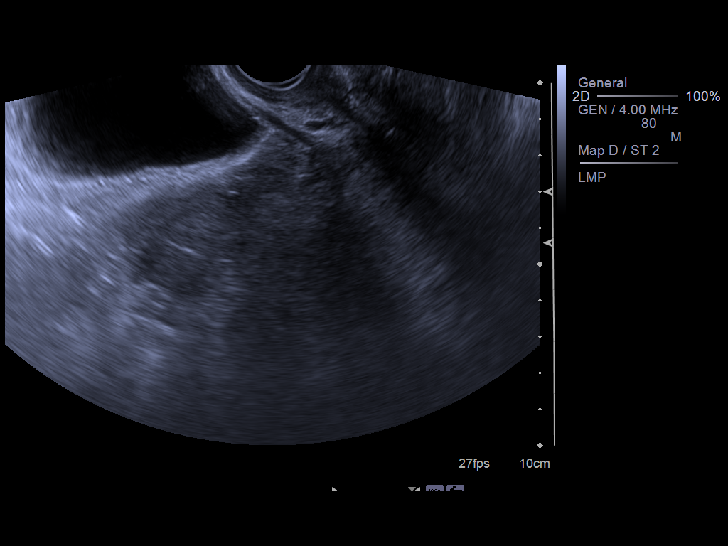
[im 36/43]
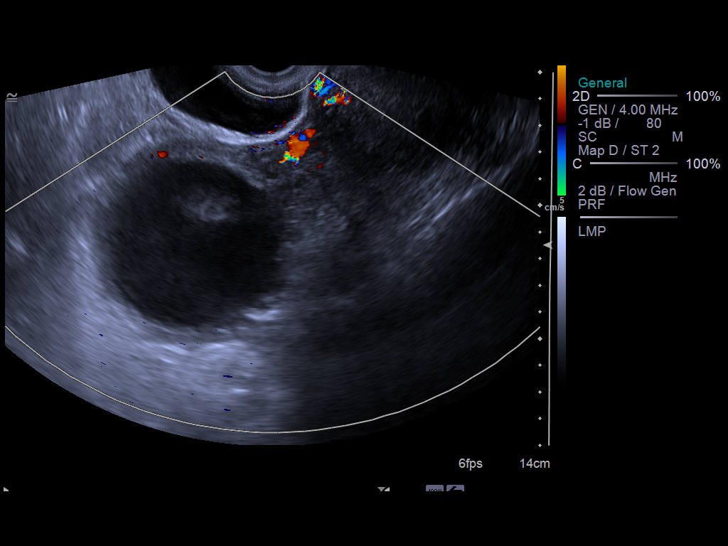
[im 39/43]
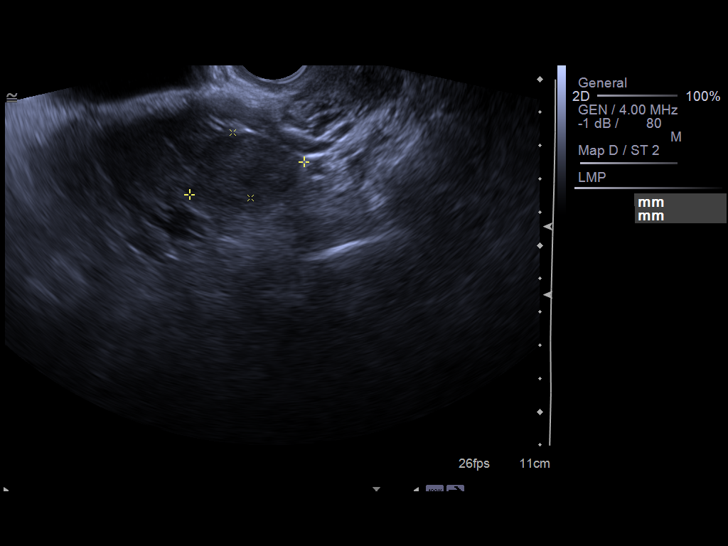
[im 43/43]
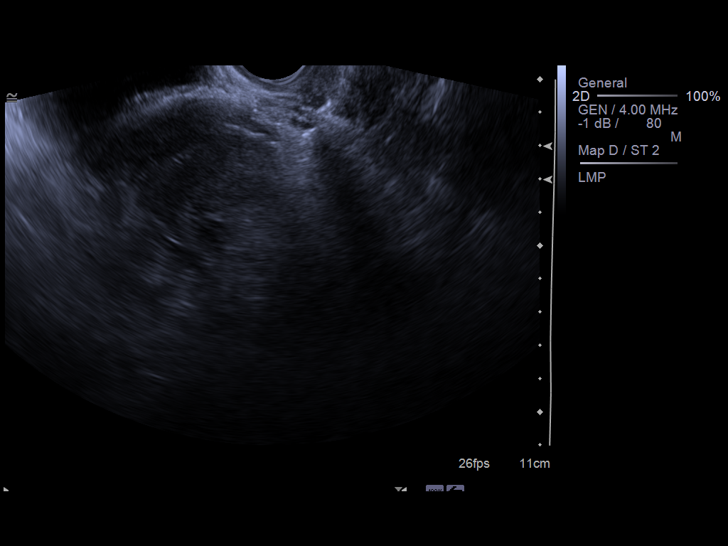

[13 of 25 positions shown; findings below may reference images not displayed]

FINDINGS: Uterus :  There is a heterogeneous structure along the posterior
aspect of the uterus that measures 5.7 x 5.0 x 5.2 cm and most
consistent with a dominant fibroid.  There is a Nabothian cyst in
the cervix.  Uterus measures 11.6 x 7.1 x 7.4 cm.

Endometrium measures 0.7 cm.

Right Ovary:   There is a large hypoechoic structure in the right
adnexa with acoustic enhancement.  Findings consistent with a large
cyst.  This cyst appears to be relatively simple.  The cyst
measures 6.9 x 7.5 x 8.3 cm.  Right ovary itself measures 8.6 x
x 9.8 cm.  This large cyst is new from the study on 09/01/2009.

Left Ovary measures 3.6 x 2.0 x 1.9 cm.  Normal appearance of the
left ovary.

Other Findings:  Small amount of free fluid in the pelvis.
IMPRESSION: Development of a large right adnexal cyst measuring up to 8.3 cm.
Recommend gynecologic consultation for a cyst of this size.

Heterogeneous structure along the posterior uterus most consistent
with a fibroid.  This fibroid has enlarged compared to the exam on
09/01/2009.

## 2013-01-09 ENCOUNTER — Emergency Department (HOSPITAL_COMMUNITY)
Admission: EM | Admit: 2013-01-09 | Discharge: 2013-01-09 | Disposition: A | Discharge status: Home / Self Care | Payer: Medicaid Other | Attending: Emergency Medicine | Admitting: Emergency Medicine

## 2013-01-09 ENCOUNTER — Emergency Department (HOSPITAL_COMMUNITY): Payer: Medicaid Other

## 2013-01-09 ENCOUNTER — Encounter (HOSPITAL_COMMUNITY): Payer: Self-pay | Admitting: Emergency Medicine

## 2013-01-09 DIAGNOSIS — Z888 Allergy status to other drugs, medicaments and biological substances status: Secondary | ICD-10-CM | POA: Insufficient documentation

## 2013-01-09 DIAGNOSIS — IMO0002 Reserved for concepts with insufficient information to code with codable children: Secondary | ICD-10-CM | POA: Insufficient documentation

## 2013-01-09 DIAGNOSIS — Z880 Allergy status to penicillin: Secondary | ICD-10-CM | POA: Insufficient documentation

## 2013-01-09 DIAGNOSIS — W108XXA Fall (on) (from) other stairs and steps, initial encounter: Secondary | ICD-10-CM | POA: Insufficient documentation

## 2013-01-09 DIAGNOSIS — Z23 Encounter for immunization: Secondary | ICD-10-CM | POA: Insufficient documentation

## 2013-01-09 DIAGNOSIS — S63509A Unspecified sprain of unspecified wrist, initial encounter: Secondary | ICD-10-CM | POA: Insufficient documentation

## 2013-01-09 DIAGNOSIS — S8392XA Sprain of unspecified site of left knee, initial encounter: Secondary | ICD-10-CM

## 2013-01-09 DIAGNOSIS — S93402A Sprain of unspecified ligament of left ankle, initial encounter: Secondary | ICD-10-CM

## 2013-01-09 DIAGNOSIS — D649 Anemia, unspecified: Secondary | ICD-10-CM | POA: Insufficient documentation

## 2013-01-09 DIAGNOSIS — Y929 Unspecified place or not applicable: Secondary | ICD-10-CM | POA: Insufficient documentation

## 2013-01-09 DIAGNOSIS — Y9301 Activity, walking, marching and hiking: Secondary | ICD-10-CM | POA: Insufficient documentation

## 2013-01-09 DIAGNOSIS — I252 Old myocardial infarction: Secondary | ICD-10-CM | POA: Insufficient documentation

## 2013-01-09 DIAGNOSIS — S63502A Unspecified sprain of left wrist, initial encounter: Secondary | ICD-10-CM

## 2013-01-09 DIAGNOSIS — Z79899 Other long term (current) drug therapy: Secondary | ICD-10-CM | POA: Insufficient documentation

## 2013-01-09 DIAGNOSIS — S93409A Sprain of unspecified ligament of unspecified ankle, initial encounter: Secondary | ICD-10-CM | POA: Insufficient documentation

## 2013-01-09 DIAGNOSIS — Z8709 Personal history of other diseases of the respiratory system: Secondary | ICD-10-CM | POA: Insufficient documentation

## 2013-01-09 DIAGNOSIS — Z87891 Personal history of nicotine dependence: Secondary | ICD-10-CM | POA: Insufficient documentation

## 2013-01-09 DIAGNOSIS — T148XXA Other injury of unspecified body region, initial encounter: Secondary | ICD-10-CM

## 2013-01-09 DIAGNOSIS — W19XXXA Unspecified fall, initial encounter: Secondary | ICD-10-CM

## 2013-01-09 MED ORDER — IBUPROFEN 400 MG PO TABS
800.0000 mg | ORAL_TABLET | Freq: Once | ORAL | Status: AC
  Administered 2013-01-09: 800 mg via ORAL
  Filled 2013-01-09: qty 2

## 2013-01-09 MED ORDER — IBUPROFEN 800 MG PO TABS: 800.0000 mg | ORAL_TABLET | Freq: Three times a day (TID) | ORAL | Status: DC | PRN

## 2013-01-09 MED ORDER — HYDROCODONE-ACETAMINOPHEN 5-325 MG PO TABS: 1.0000 | ORAL_TABLET | ORAL | Status: DC | PRN

## 2013-01-09 MED ORDER — TETANUS-DIPHTH-ACELL PERTUSSIS 5-2.5-18.5 LF-MCG/0.5 IM SUSP
0.5000 mL | Freq: Once | INTRAMUSCULAR | Status: AC
  Administered 2013-01-09: 0.5 mL via INTRAMUSCULAR
  Filled 2013-01-09: qty 0.5

## 2013-01-09 NOTE — ED Provider Notes (Signed)
CSN: [id number]     Arrival date & time 01/09/13  2109 History   First MD Initiated Contact with Patient 01/09/13 2212     Chief Complaint  Patient presents with  . Fall   HPI  History provided by the patient. The patient is a 42 year old female who presents with injuries after a fall. Patient was going up steps wearing high heels when she reports inverting her left ankle causing her to fall. She complains of some pain in the ankle but has greater pain in the knee which she landed directly on. She also tried to break followed extending her left arm and complains of left wrist pains. She denies any weakness or numbness in extremities. There was no head injury or LOC. No neck or back pain. She has not used any treatments for her symptoms. She did drive herself here to the emergency department. No other aggravating or alleviating factors. No other associated symptoms.    Past Medical History  Diagnosis Date  . Myocardial infarct   . Anemia   . Bronchitis    Past Surgical History  Procedure Laterality Date  . Eye surgery    . Abdominal hysterectomy    . Tubal ligation    . Eye surgery  10/16/10   No family history on file. History  Substance Use Topics  . Smoking status: Former Smoker -- 0.50 packs/day    Types: Cigarettes    Quit date: 03/15/2012  . Smokeless tobacco: Not on file  . Alcohol Use: Yes     Comment: occational   OB History   Grav Para Term Preterm Abortions TAB SAB Ect Mult Living                 Review of Systems  Neurological: Negative for light-headedness and headaches.  All other systems reviewed and are negative.    Allergies  Medroxyprogesterone acetate; Pork-derived products; and Penicillins  Home Medications   Current Outpatient Rx  Name  Route  Sig  Dispense  Refill  . acetaminophen (TYLENOL) 500 MG tablet   Oral   Take 2,000 mg by mouth at bedtime as needed for pain.         Marland Kitchen albuterol (PROVENTIL HFA;VENTOLIN HFA) 108 (90 BASE) MCG/ACT  inhaler   Inhalation   Inhale 2 puffs into the lungs every 6 (six) hours as needed for wheezing.         . Multiple Vitamins-Minerals (MULTIVITAMIN PO)   Oral   Take 1 tablet by mouth daily.          BP 132/71  Pulse 68  Temp(Src) 98.3 F (36.8 C) (Oral)  Resp 18  Ht 5\' 11"  (1.803 m)  Wt 277 lb 1.6 oz (125.692 kg)  BMI 38.66 kg/m2  SpO2 100% Physical Exam  Nursing note and vitals reviewed. Constitutional: She is oriented to person, place, and time. She appears well-developed and well-nourished. No distress.  HENT:  Head: Normocephalic and atraumatic.  Neck: Normal range of motion. Neck supple.  No cervical midline tenderness.  NEXUS criteria met.  Cardiovascular: Normal rate and regular rhythm.   Pulmonary/Chest: Effort normal and breath sounds normal. No respiratory distress. She has no wheezes. She has no rales.  Abdominal: Soft.  Musculoskeletal:  Mild edema of the left ankle with tenderness greatest over the medial malleolus. No sniff campaigns over the lateral malleolus or proximal fifth metatarsal. There is no gross deformity. Normal dorsal pedal pulses. Normal sensations to light touch. Normal movement in the toes.  There is moderate swelling of the left knee with small superficial abrasions to the anterior aspect. Diffuse tenderness to palpation. Reduced range of motion secondary to pain. No gross deformity. Patient is able to extend leg and hold off of the bed with no signs or patellar tendon rupture.  Pain over the left wrist with reduced range of motion secondary to pain. No snuff box tenderness. Normal sensation capillary refill in the fingers. Able to make a fist but has pain. No pain at the elbow with normal exam.  Neurological: She is alert and oriented to person, place, and time.  Skin: Skin is warm and dry. No rash noted.  Psychiatric: She has a normal mood and affect. Her behavior is normal.    ED Course  Procedures   COORDINATION OF CARE:  Nursing  notes reviewed. Vital signs reviewed. Initial pt interview and examination performed.   10:29 PM-patient seen and evaluated. Patient appears uncomfortable in no acute distress. No head injury or neck pains.  NEXUS criteria met.  Discussed work up plan with pt at bedside, which includes ibuprofen and ice for pain and swelling given that she drove to the ED. Will also update tetanus for abrasions to knee. Pt agrees with plan.  X-rays were reviewed without any signs of concerning or acute injury.   Imaging Review Dg Wrist Complete Left  01/09/2013   CLINICAL DATA:  Pain post fall.  EXAM: LEFT WRIST - COMPLETE 3+ VIEW  COMPARISON:  None.  FINDINGS: There is no evidence of fracture or dislocation. There is no evidence of arthropathy or other focal bone abnormality. Soft tissues are unremarkable. Carpal rows intact.  IMPRESSION: Negative.   Electronically Signed   By: Oley Balm M.D.   On: 01/09/2013 23:01   Dg Ankle Complete Left  01/09/2013   CLINICAL DATA:  Pain post fall.  EXAM: LEFT ANKLE COMPLETE - 3+ VIEW  COMPARISON:  08/20/2011  FINDINGS: There is no evidence of fracture, dislocation, or joint effusion. There is no evidence of arthropathy or other focal bone abnormality. Soft tissues are unremarkable. Ankle mortise intact.  IMPRESSION: Negative.   Electronically Signed   By: Oley Balm M.D.   On: 01/09/2013 23:00   Dg Knee Complete 4 Views Left  01/09/2013   CLINICAL DATA:  Pain post fall.  EXAM: LEFT KNEE - COMPLETE 4+ VIEW  COMPARISON:  None.  FINDINGS: There is no evidence of fracture, dislocation, or joint effusion. There is no evidence of arthropathy or other focal bone abnormality. Soft tissues are unremarkable.  IMPRESSION: Negative.   Electronically Signed   By: Oley Balm M.D.   On: 01/09/2013 23:00     MDM   1. Fall, initial encounter   2. Knee sprain, left, initial encounter   3. Ankle sprain, left, initial encounter   4. Wrist sprain, left, initial encounter    5. Abrasion        Angus Seller, PA-C 01/10/13 [phone]

## 2013-01-09 NOTE — ED Notes (Signed)
Knee sleeve too tight for pt's left knee, PA informed. Knee sleeved removed. Ace bandage applied to left knee instead. Pt states the ace bandage is more comfortable.

## 2013-01-09 NOTE — ED Notes (Signed)
Pt. missed her step and fell while going down steps while wearing high heels shoes , no LOC /ambulatory , pt. stated pain at left ankle / left knee/ left wrist and left upper arm .

## 2013-01-14 NOTE — ED Provider Notes (Signed)
Medical screening examination/treatment/procedure(s) were performed by non-physician practitioner and as supervising physician I was immediately available for consultation/collaboration.  EKG Interpretation   None         Yannely Kintzel J Rether Rison, MD 01/14/13 1059 

## 2013-03-15 IMAGING — CT CT CERVICAL SPINE W/O CM
5 of 8 series · 14 of 33 positions shown, 15 images · non-contrast
Comparison: Head CT 07/15/2006

CT HEAD

CLINICAL DATA: Trauma, left periorbital hematoma

CT HEAD WITHOUT CONTRAST
CT MAXILLOFACIAL WITHOUT CONTRAST
CT CERVICAL SPINE WITHOUT CONTRAST
TECHNIQUE: Multidetector CT imaging of the head, cervical spine,
and maxillofacial structures were performed using the standard
protocol without intravenous contrast. Multiplanar CT image
reconstructions of the cervical spine and maxillofacial structures
were also generated.

[Series 6: facial 2.0 h31s st · axial · 0.31mm/px · z∈[-182,-128]mm · 2 of 83 slices shown, 3 images]
[im 28/83  soft-tissue]
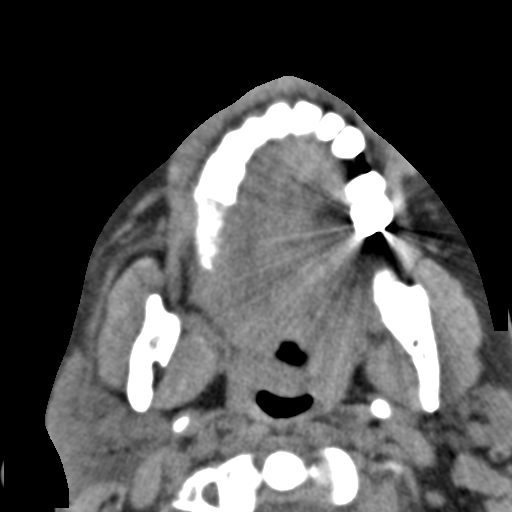
[im 28/83  bone]
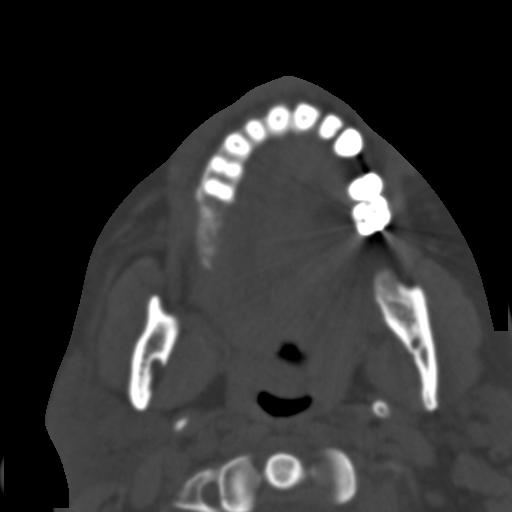
[im 55/83  bone]
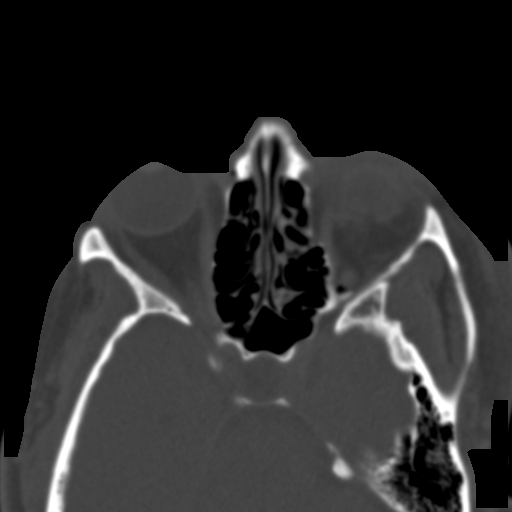

[Series 7: facial coronal · coronal · 0.34mm/px · 3 of 56 slices shown]
[im 14/56  bone]
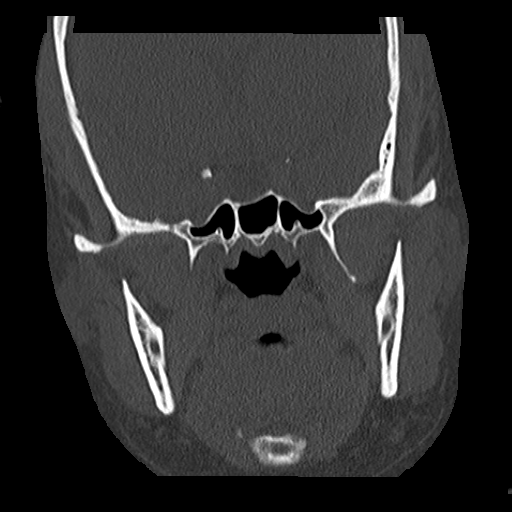
[im 28/56  bone]
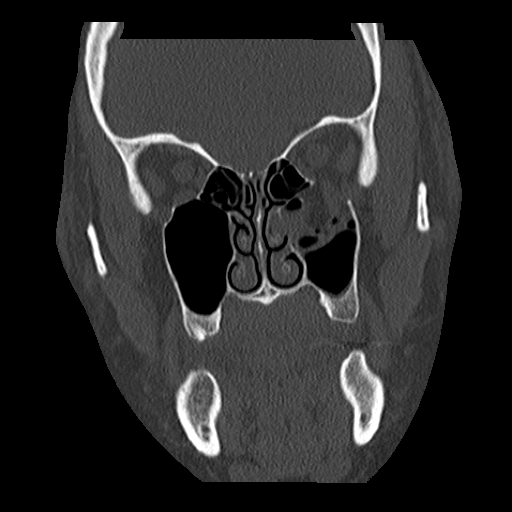
[im 42/56  bone]
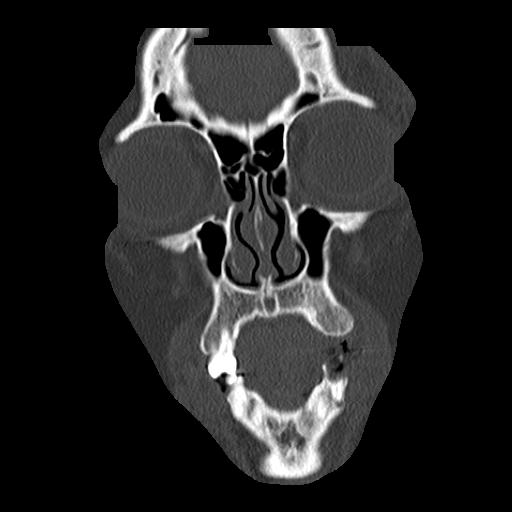

[Series 8: facial sagittal · sagittal · 0.33mm/px · 5 of 65 slices shown]
[im 10/65  bone]
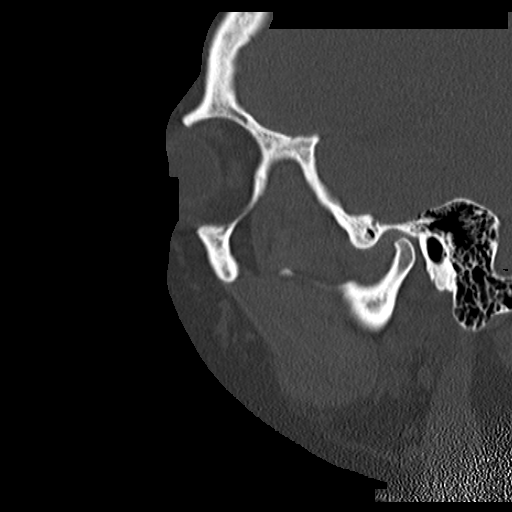
[im 19/65  bone]
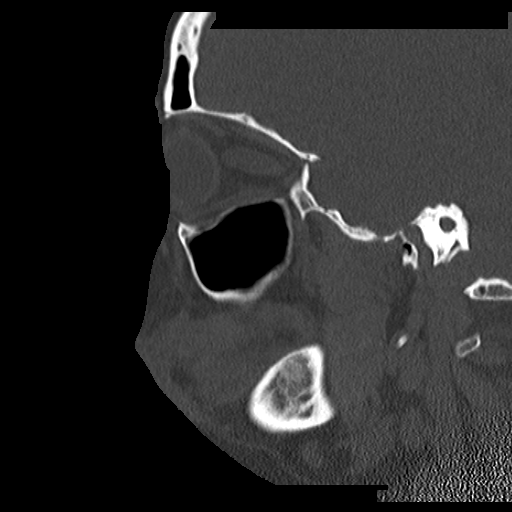
[im 28/65  bone]
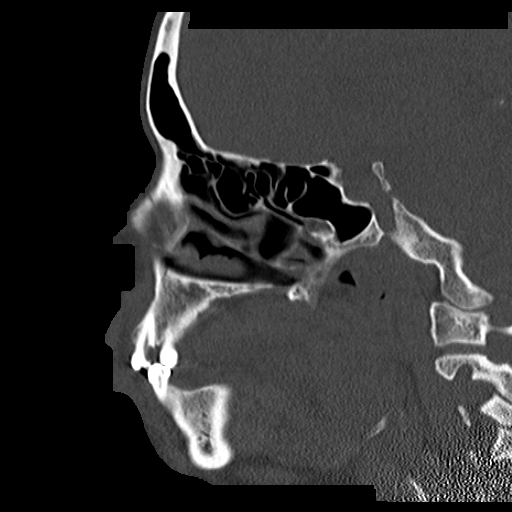
[im 37/65  bone]
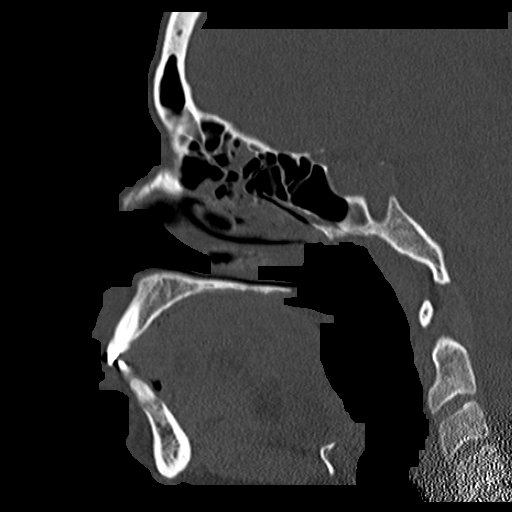
[im 46/65  bone]
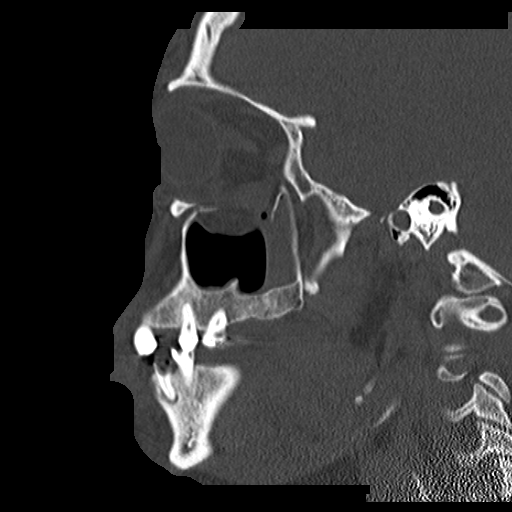

[Series 12: c_spine 2.0 b31s detail · axial · 0.22mm/px · z∈[-250,-196]mm · 2 of 83 slices shown]
[im 28/83  bone]
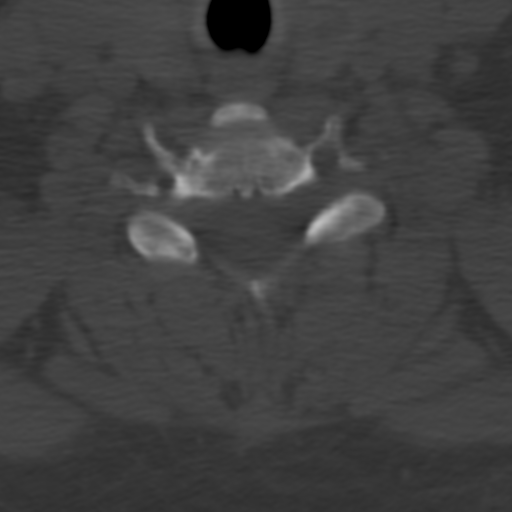
[im 55/83  bone]
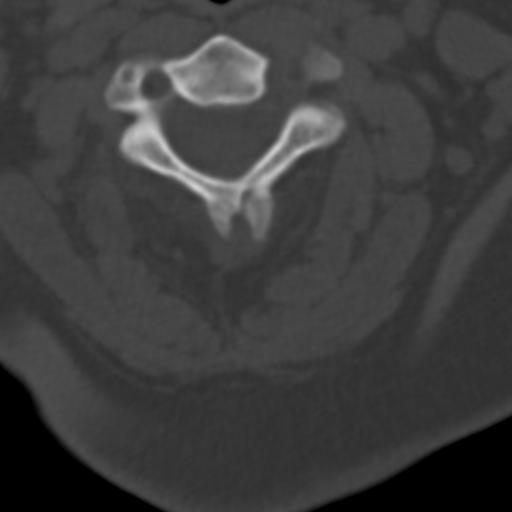

[Series 602: ortho · axial · 0.32mm/px · z∈[-268,-227]mm · 2 of 68 slices shown]
[im 23/68  bone]
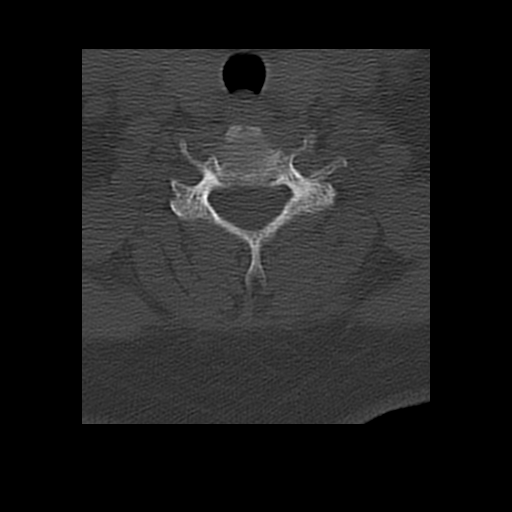
[im 45/68  bone]
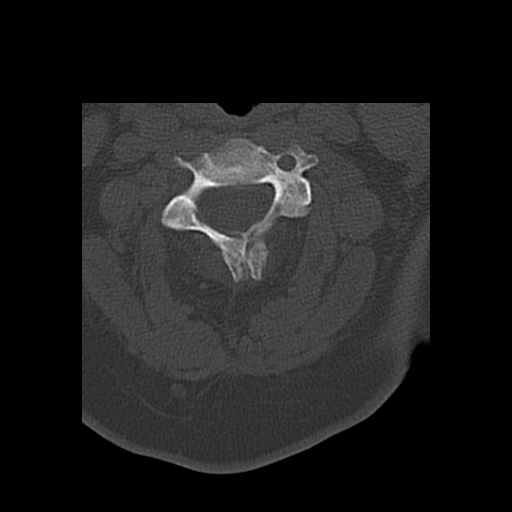

[14 of 33 positions shown; findings below may reference images not displayed]

FINDINGS: No acute hemorrhage, acute infarction, or mass lesion is
identified.  No midline shift.  No ventriculomegaly.  Findings
pertaining to the skull will be discussed in dedicated report
below.
IMPRESSION: No acute intracranial finding.  Please see maxillofacial CT report
below for further detail on osseous finding.

CT MAXILLOFACIAL
FINDINGS: There is extensive left periorbital soft tissue
swelling.  Left maxillary sinus air fluid level noted.  Depressed
left orbital floor fracture is noted with partial entrapment of the
inferior rectus muscle and intraorbital fat.  There is also a
mildly displaced fracture of the medial left orbital wall with
adjacent partial opacification of the ethmoid sinuses.

The vomer is midline.  No displaced nasal bone fracture is
identified.  Bilateral temporal mandibular joints are properly
located.  Mandible is intact.  Extensive dental caries are noted.
IMPRESSION: Comminuted left medial and inferior orbital fractures, with
probable partial entrapment of the left inferior rectus muscle and
orbital fat.

CT CERVICAL SPINE
FINDINGS: C1 through the cervical thoracic junction is visualized
in its entirety.  Mild disc degenerative change noted at C5-C6. No
precervical soft tissue widening is present. Fine detail is
obscured by patient body habitus.  Vertebral body heights are
maintained.  No decreased intervertebral disc space.  No fracture
or dislocation is identified.
IMPRESSION: No cervical spine fracture identified.

## 2013-03-17 ENCOUNTER — Emergency Department (HOSPITAL_COMMUNITY): Payer: Medicaid Other

## 2013-03-17 ENCOUNTER — Encounter (HOSPITAL_COMMUNITY): Payer: Self-pay | Admitting: Emergency Medicine

## 2013-03-17 ENCOUNTER — Emergency Department (HOSPITAL_COMMUNITY)
Admission: EM | Admit: 2013-03-17 | Discharge: 2013-03-17 | Disposition: A | Discharge status: Home / Self Care | Payer: Medicaid Other | Attending: Emergency Medicine | Admitting: Emergency Medicine

## 2013-03-17 DIAGNOSIS — R5381 Other malaise: Secondary | ICD-10-CM | POA: Insufficient documentation

## 2013-03-17 DIAGNOSIS — M545 Low back pain, unspecified: Secondary | ICD-10-CM | POA: Insufficient documentation

## 2013-03-17 DIAGNOSIS — R05 Cough: Secondary | ICD-10-CM

## 2013-03-17 DIAGNOSIS — M549 Dorsalgia, unspecified: Secondary | ICD-10-CM

## 2013-03-17 DIAGNOSIS — I252 Old myocardial infarction: Secondary | ICD-10-CM | POA: Insufficient documentation

## 2013-03-17 DIAGNOSIS — R5383 Other fatigue: Secondary | ICD-10-CM

## 2013-03-17 DIAGNOSIS — M19079 Primary osteoarthritis, unspecified ankle and foot: Secondary | ICD-10-CM | POA: Insufficient documentation

## 2013-03-17 DIAGNOSIS — Z862 Personal history of diseases of the blood and blood-forming organs and certain disorders involving the immune mechanism: Secondary | ICD-10-CM | POA: Insufficient documentation

## 2013-03-17 DIAGNOSIS — M1990 Unspecified osteoarthritis, unspecified site: Secondary | ICD-10-CM

## 2013-03-17 DIAGNOSIS — J3489 Other specified disorders of nose and nasal sinuses: Secondary | ICD-10-CM | POA: Insufficient documentation

## 2013-03-17 DIAGNOSIS — R112 Nausea with vomiting, unspecified: Secondary | ICD-10-CM | POA: Insufficient documentation

## 2013-03-17 DIAGNOSIS — I2510 Atherosclerotic heart disease of native coronary artery without angina pectoris: Secondary | ICD-10-CM | POA: Insufficient documentation

## 2013-03-17 DIAGNOSIS — R059 Cough, unspecified: Secondary | ICD-10-CM | POA: Insufficient documentation

## 2013-03-17 DIAGNOSIS — Z87891 Personal history of nicotine dependence: Secondary | ICD-10-CM | POA: Insufficient documentation

## 2013-03-17 MED ORDER — GUAIFENESIN ER 600 MG PO TB12: 1200.0000 mg | ORAL_TABLET | Freq: Two times a day (BID) | ORAL | Status: DC

## 2013-03-17 MED ORDER — IBUPROFEN 800 MG PO TABS: 800.0000 mg | ORAL_TABLET | Freq: Three times a day (TID) | ORAL | Status: DC

## 2013-03-17 MED ORDER — BENZONATATE 100 MG PO CAPS: 100.0000 mg | ORAL_CAPSULE | Freq: Three times a day (TID) | ORAL | Status: DC

## 2013-03-17 MED ORDER — HYDROCOD POLST-CHLORPHEN POLST 10-8 MG/5ML PO LQCR: 5.0000 mL | Freq: Two times a day (BID) | ORAL | Status: DC

## 2013-03-17 NOTE — ED Notes (Signed)
Presents with 3 weeks of coughing and reports some mild yellow sputum with cough. Also c/o many years of worsening lower back pain and 2-3 months of left 3rd and 4th toe pain. Cms intact in toes. Bilateral breath sounds clear. Reports chills.

## 2013-03-17 NOTE — ED Provider Notes (Signed)
Medical screening examination/treatment/procedure(s) were performed by non-physician practitioner and as supervising physician I was immediately available for consultation/collaboration.  EKG Interpretation   None         Hoy Morn, MD 03/17/13 [phone]

## 2013-03-17 NOTE — ED Provider Notes (Signed)
CSN: [id number]     Arrival date & time 03/17/13  0124 History   First MD Initiated Contact with Patient 03/17/13 0140     Chief Complaint  Patient presents with  . Cough  . Foot Pain   HPI  History provided by the patient. Patient is a 43 year old female with history of bronchitis and CAD who presents with complaints of persistent congestion and cough. Patient states that she first began having congestion and cough 3 weeks ago. At that time she also reports having 2 days of nausea and vomiting. There was no diarrhea. She did begin to feel better but continued to have congestion and cough. Yesterday she began to feel bad again with increased fatigue and also reports multiple episodes of vomiting. Cough is primarily dry. She denies any diarrhea. She does have some nasal congestion and rhinorrhea. Patient also complains of some increased lower back pain worse with moving and bending. Denies any specific injury or trauma. Denies any flank pain, dysuria, hematuria urinary frequency. She states multiple people in the family have been sick recently with cough and cold. Denies any fever, chills or sweats. No recent travel.    Past Medical History  Diagnosis Date  . Myocardial infarct   . Anemia   . Bronchitis    Past Surgical History  Procedure Laterality Date  . Eye surgery    . Abdominal hysterectomy    . Tubal ligation    . Eye surgery  10/16/10   History reviewed. No pertinent family history. History  Substance Use Topics  . Smoking status: Former Smoker -- 0.50 packs/day    Types: Cigarettes    Quit date: 03/15/2012  . Smokeless tobacco: Not on file  . Alcohol Use: Yes     Comment: occational   OB History   Grav Para Term Preterm Abortions TAB SAB Ect Mult Living                 Review of Systems  Constitutional: Negative for fever, chills and diaphoresis.  HENT: Positive for congestion and rhinorrhea.   Respiratory: Positive for cough. Negative for shortness of breath.     Cardiovascular: Negative for chest pain.  Gastrointestinal: Positive for nausea and vomiting. Negative for abdominal pain and diarrhea.  Musculoskeletal: Positive for back pain.  All other systems reviewed and are negative.    Allergies  Medroxyprogesterone acetate; Pork-derived products; and Penicillins  Home Medications   Current Outpatient Rx  Name  Route  Sig  Dispense  Refill  . ibuprofen (ADVIL,MOTRIN) 200 MG tablet   Oral   Take 200 mg by mouth every 6 (six) hours as needed.          BP 105/77  Pulse 98  Temp(Src) 98.4 F (36.9 C) (Oral)  Resp 18  Wt 279 lb 9 oz (126.809 kg)  SpO2 100% Physical Exam  Nursing note and vitals reviewed. Constitutional: She is oriented to person, place, and time. She appears well-developed and well-nourished. No distress.  HENT:  Head: Normocephalic.  Cardiovascular: Normal rate and regular rhythm.   Pulmonary/Chest: Effort normal and breath sounds normal. No respiratory distress. She has no wheezes. She has no rales.  Occasional coughing  Musculoskeletal: She exhibits edema.       Lumbar back: She exhibits tenderness.       Back:  Mild edema to the dorsal lateral left foot. There is tenderness diffusely dorsal foot. Normal dorsal pedal pulses. Skin normal without erythema. Normal sensations and capillary refill.  Neurological:  She is alert and oriented to person, place, and time.  Skin: Skin is warm and dry. No rash noted.  Psychiatric: She has a normal mood and affect. Her behavior is normal.    ED Course  Procedures   DIAGNOSTIC STUDIES: Oxygen Saturation is 97% on room air.    COORDINATION OF CARE:  Nursing notes reviewed. Vital signs reviewed. Initial pt interview and examination performed.   3:00 AM-patient seen and evaluated. Patient appears well no acute distress. Normal respirations and O2 sats. Occasional coughing. Discussed work up plan with pt at bedside, which includes x-rays. Pt agrees with plan.  X-rays  reviewed. Concerning findings in the foot or chest. Lungs are clear. At this time will treat symptomatic treatments.     Imaging Review Dg Chest 2 View  03/17/2013   CLINICAL DATA:  Cough and shortness of breath. Foot pain and swelling.  EXAM: CHEST  2 VIEW  COMPARISON:  04/05/2012  FINDINGS: The heart size and mediastinal contours are within normal limits. Both lungs are clear. The visualized skeletal structures are unremarkable.  IMPRESSION: No active cardiopulmonary disease.   Electronically Signed   By: Lucienne Capers M.D.   On: 03/17/2013 02:09   Dg Foot Complete Left  03/17/2013   CLINICAL DATA:  Left foot pain and swelling. Previous fracture in the 3rd and 4th toes.  EXAM: LEFT FOOT - COMPLETE 3+ VIEW  COMPARISON:  None.  FINDINGS: There is no evidence of fracture or dislocation. There is no evidence of arthropathy or other focal bone abnormality. Soft tissues are unremarkable.  IMPRESSION: Negative.   Electronically Signed   By: Lucienne Capers M.D.   On: 03/17/2013 02:35     MDM   1. Cough   2. Arthritis   3. Back pain        Martie Lee, PA-C 03/17/13 0321

## 2013-03-17 NOTE — Discharge Instructions (Signed)
Your chest x-ray and foot x-ray has not shown any signs for concerning cause of your pains. No signs of pneumonia infection. Your providers have given you prescriptions for medications to help treat her symptoms. Please use these and followup with a primary care provider for continued evaluation and treatment.    Cough, Adult  A cough is a reflex. It helps you clear your throat and airways. A cough can help heal your body. A cough can last 2 or 3 weeks (acute) or may last more than 8 weeks (chronic). Some common causes of a cough can include an infection, allergy, or a cold. HOME CARE  Only take medicine as told by your doctor.  If given, take your medicines (antibiotics) as told. Finish them even if you start to feel better.  Use a cold steam vaporizer or humidier in your home. This can help loosen thick spit (secretions).  Sleep so you are almost sitting up (semi-upright). Use pillows to do this. This helps reduce coughing.  Rest as needed.  Stop smoking if you smoke. GET HELP RIGHT AWAY IF:  You have yellowish-white fluid (pus) in your thick spit.  Your cough gets worse.  Your medicine does not reduce coughing, and you are losing sleep.  You cough up blood.  You have trouble breathing.  Your pain gets worse and medicine does not help.  You have a fever. MAKE SURE YOU:   Understand these instructions.  Will watch your condition.  Will get help right away if you are not doing well or get worse. Document Released: 11/13/2010 Document Revised: 05/25/2011 Document Reviewed: 11/13/2010 [facility] Patient Information 2014 [place].    Back Pain, Adult Back pain is very common. The pain often gets better over time. The cause of back pain is usually not dangerous. Most people can learn to manage their back pain on their own.  HOME CARE   Stay active. Start with short walks on flat ground if you can. Try to walk farther each day.  Do not sit, drive, or stand in one  place for more than 30 minutes. Do not stay in bed.  Do not avoid exercise or work. Activity can help your back heal faster.  Be careful when you bend or lift an object. Bend at your knees, keep the object close to you, and do not twist.  Sleep on a firm mattress. Lie on your side, and bend your knees. If you lie on your back, put a pillow under your knees.  Only take medicines as told by your doctor.  Put ice on the injured area.  Put ice in a plastic bag.  Place a towel between your skin and the bag.  Leave the ice on for 15-20 minutes, 03-04 times a day for the first 2 to 3 days. After that, you can switch between ice and heat packs.  Ask your doctor about back exercises or massage.  Avoid feeling anxious or stressed. Find good ways to deal with stress, such as exercise. GET HELP RIGHT AWAY IF:   Your pain does not go away with rest or medicine.  Your pain does not go away in 1 week.  You have new problems.  You do not feel well.  The pain spreads into your legs.  You cannot control when you poop (bowel movement) or pee (urinate).  Your arms or legs feel weak or lose feeling (numbness).  You feel sick to your stomach (nauseous) or throw up (vomit).  You have belly (  abdominal) pain.  You feel like you may pass out (faint). MAKE SURE YOU:   Understand these instructions.  Will watch your condition.  Will get help right away if you are not doing well or get worse. Document Released: 08/19/2007 Document Revised: 05/25/2011 Document Reviewed: 07/21/2010 [facility] Patient Information 2014 [place].

## 2013-03-18 ENCOUNTER — Telehealth (HOSPITAL_BASED_OUTPATIENT_CLINIC_OR_DEPARTMENT_OTHER): Payer: Self-pay | Admitting: Emergency Medicine

## 2013-04-12 ENCOUNTER — Emergency Department (HOSPITAL_COMMUNITY)
Admission: EM | Admit: 2013-04-12 | Discharge: 2013-04-12 | Disposition: A | Discharge status: Home / Self Care | Payer: Medicaid Other | Attending: Emergency Medicine | Admitting: Emergency Medicine

## 2013-04-12 ENCOUNTER — Encounter (HOSPITAL_COMMUNITY): Payer: Self-pay | Admitting: Emergency Medicine

## 2013-04-12 DIAGNOSIS — I252 Old myocardial infarction: Secondary | ICD-10-CM | POA: Insufficient documentation

## 2013-04-12 DIAGNOSIS — Z880 Allergy status to penicillin: Secondary | ICD-10-CM | POA: Insufficient documentation

## 2013-04-12 DIAGNOSIS — Z8709 Personal history of other diseases of the respiratory system: Secondary | ICD-10-CM | POA: Insufficient documentation

## 2013-04-12 DIAGNOSIS — R519 Headache, unspecified: Secondary | ICD-10-CM

## 2013-04-12 DIAGNOSIS — R51 Headache: Secondary | ICD-10-CM | POA: Insufficient documentation

## 2013-04-12 DIAGNOSIS — Z87891 Personal history of nicotine dependence: Secondary | ICD-10-CM | POA: Insufficient documentation

## 2013-04-12 DIAGNOSIS — R112 Nausea with vomiting, unspecified: Secondary | ICD-10-CM | POA: Insufficient documentation

## 2013-04-12 DIAGNOSIS — H53149 Visual discomfort, unspecified: Secondary | ICD-10-CM | POA: Insufficient documentation

## 2013-04-12 DIAGNOSIS — Z862 Personal history of diseases of the blood and blood-forming organs and certain disorders involving the immune mechanism: Secondary | ICD-10-CM | POA: Insufficient documentation

## 2013-04-12 MED ORDER — KETOROLAC TROMETHAMINE 30 MG/ML IJ SOLN
30.0000 mg | Freq: Once | INTRAMUSCULAR | Status: AC
  Administered 2013-04-12: 30 mg via INTRAVENOUS
  Filled 2013-04-12: qty 1

## 2013-04-12 MED ORDER — DIPHENHYDRAMINE HCL 50 MG/ML IJ SOLN
25.0000 mg | Freq: Once | INTRAMUSCULAR | Status: AC
  Administered 2013-04-12: 25 mg via INTRAVENOUS
  Filled 2013-04-12: qty 1

## 2013-04-12 MED ORDER — METOCLOPRAMIDE HCL 5 MG/ML IJ SOLN
10.0000 mg | Freq: Once | INTRAMUSCULAR | Status: AC
  Administered 2013-04-12: 10 mg via INTRAVENOUS
  Filled 2013-04-12: qty 2

## 2013-04-12 MED ORDER — ONDANSETRON 8 MG PO TBDP: 8.0000 mg | ORAL_TABLET | Freq: Three times a day (TID) | ORAL | Status: DC | PRN

## 2013-04-12 MED ORDER — ONDANSETRON HCL 4 MG/2ML IJ SOLN
4.0000 mg | Freq: Once | INTRAMUSCULAR | Status: AC
  Administered 2013-04-12: 4 mg via INTRAVENOUS
  Filled 2013-04-12: qty 2

## 2013-04-12 NOTE — Discharge Instructions (Signed)
Headaches, Frequently Asked Questions MIGRAINE HEADACHES Q: What is migraine? What causes it? How can I treat it? A: Generally, migraine headaches begin as a dull ache. Then they develop into a constant, throbbing, and pulsating pain. You may experience pain at the temples. You may experience pain at the front or back of one or both sides of the head. The pain is usually accompanied by a combination of:  Nausea.  Vomiting.  Sensitivity to light and noise. Some people (about 15%) experience an aura (see below) before an attack. The cause of migraine is believed to be chemical reactions in the brain. Treatment for migraine may include over-the-counter or prescription medications. It may also include self-help techniques. These include relaxation training and biofeedback.  Q: What is an aura? A: About 15% of people with migraine get an "aura". This is a sign of neurological symptoms that occur before a migraine headache. You may see wavy or jagged lines, dots, or flashing lights. You might experience tunnel vision or blind spots in one or both eyes. The aura can include visual or auditory hallucinations (something imagined). It may include disruptions in smell (such as strange odors), taste or touch. Other symptoms include:  Numbness.  A "pins and needles" sensation.  Difficulty in recalling or speaking the correct word. These neurological events may last as long as 60 minutes. These symptoms will fade as the headache begins. Q: What is a trigger? A: Certain physical or environmental factors can lead to or "trigger" a migraine. These include:  Foods.  Hormonal changes.  Weather.  Stress. It is important to remember that triggers are different for everyone. To help prevent migraine attacks, you need to figure out which triggers affect you. Keep a headache diary. This is a good way to track triggers. The diary will help you talk to your healthcare professional about your condition. Q: Does  weather affect migraines? A: Bright sunshine, hot, humid conditions, and drastic changes in barometric pressure may lead to, or "trigger," a migraine attack in some people. But studies have shown that weather does not act as a trigger for everyone with migraines. Q: What is the link between migraine and hormones? A: Hormones start and regulate many of your body's functions. Hormones keep your body in balance within a constantly changing environment. The levels of hormones in your body are unbalanced at times. Examples are during menstruation, pregnancy, or menopause. That can lead to a migraine attack. In fact, about three quarters of all women with migraine report that their attacks are related to the menstrual cycle.  Q: Is there an increased risk of stroke for migraine sufferers? A: The likelihood of a migraine attack causing a stroke is very remote. That is not to say that migraine sufferers cannot have a stroke associated with their migraines. In persons under age 53, the most common associated factor for stroke is migraine headache. But over the course of a person's normal life span, the occurrence of migraine headache may actually be associated with a reduced risk of dying from cerebrovascular disease due to stroke.  Q: What are acute medications for migraine? A: Acute medications are used to treat the pain of the headache after it has started. Examples over-the-counter medications, NSAIDs, ergots, and triptans.  Q: What are the triptans? A: Triptans are the newest class of abortive medications. They are specifically targeted to treat migraine. Triptans are vasoconstrictors. They moderate some chemical reactions in the brain. The triptans work on receptors in your brain. Triptans help  to restore the balance of a neurotransmitter called serotonin. Fluctuations in levels of serotonin are thought to be a main cause of migraine.  °Q: Are over-the-counter medications for migraine effective? °A:  Over-the-counter, or "OTC," medications may be effective in relieving mild to moderate pain and associated symptoms of migraine. But you should see your caregiver before beginning any treatment regimen for migraine.  °Q: What are preventive medications for migraine? °A: Preventive medications for migraine are sometimes referred to as "prophylactic" treatments. They are used to reduce the frequency, severity, and length of migraine attacks. Examples of preventive medications include antiepileptic medications, antidepressants, beta-blockers, calcium channel blockers, and NSAIDs (nonsteroidal anti-inflammatory drugs). °Q: Why are anticonvulsants used to treat migraine? °A: During the past few years, there has been an increased interest in antiepileptic drugs for the prevention of migraine. They are sometimes referred to as "anticonvulsants". Both epilepsy and migraine may be caused by similar reactions in the brain.  °Q: Why are antidepressants used to treat migraine? °A: Antidepressants are typically used to treat people with depression. They may reduce migraine frequency by regulating chemical levels, such as serotonin, in the brain.  °Q: What alternative therapies are used to treat migraine? °A: The term "alternative therapies" is often used to describe treatments considered outside the scope of conventional Western medicine. Examples of alternative therapy include acupuncture, acupressure, and yoga. Another common alternative treatment is herbal therapy. Some herbs are believed to relieve headache pain. Always discuss alternative therapies with your caregiver before proceeding. Some herbal products contain arsenic and other toxins. °TENSION HEADACHES °Q: What is a tension-type headache? What causes it? How can I treat it? °A: Tension-type headaches occur randomly. They are often the result of temporary stress, anxiety, fatigue, or anger. Symptoms include soreness in your temples, a tightening band-like sensation  around your head (a "vice-like" ache). Symptoms can also include a pulling feeling, pressure sensations, and contracting head and neck muscles. The headache begins in your forehead, temples, or the back of your head and neck. Treatment for tension-type headache may include over-the-counter or prescription medications. Treatment may also include self-help techniques such as relaxation training and biofeedback. °CLUSTER HEADACHES °Q: What is a cluster headache? What causes it? How can I treat it? °A: Cluster headache gets its name because the attacks come in groups. The pain arrives with little, if any, warning. It is usually on one side of the head. A tearing or bloodshot eye and a runny nose on the same side of the headache may also accompany the pain. Cluster headaches are believed to be caused by chemical reactions in the brain. They have been described as the most severe and intense of any headache type. Treatment for cluster headache includes prescription medication and oxygen. °SINUS HEADACHES °Q: What is a sinus headache? What causes it? How can I treat it? °A: When a cavity in the bones of the face and skull (a sinus) becomes inflamed, the inflammation will cause localized pain. This condition is usually the result of an allergic reaction, a tumor, or an infection. If your headache is caused by a sinus blockage, such as an infection, you will probably have a fever. An x-ray will confirm a sinus blockage. Your caregiver's treatment might include antibiotics for the infection, as well as antihistamines or decongestants.  °REBOUND HEADACHES °Q: What is a rebound headache? What causes it? How can I treat it? °A: A pattern of taking acute headache medications too often can lead to a condition known as "rebound headache."   A pattern of taking too much headache medication includes taking it more than 2 days per week or in excessive amounts. That means more than the label or a caregiver advises. With rebound  headaches, your medications not only stop relieving pain, they actually begin to cause headaches. Doctors treat rebound headache by tapering the medication that is being overused. Sometimes your caregiver will gradually substitute a different type of treatment or medication. Stopping may be a challenge. Regularly overusing a medication increases the potential for serious side effects. Consult a caregiver if you regularly use headache medications more than 2 days per week or more than the label advises. ADDITIONAL QUESTIONS AND ANSWERS Q: What is biofeedback? A: Biofeedback is a self-help treatment. Biofeedback uses special equipment to monitor your body's involuntary physical responses. Biofeedback monitors:  Breathing.  Pulse.  Heart rate.  Temperature.  Muscle tension.  Brain activity. Biofeedback helps you refine and perfect your relaxation exercises. You learn to control the physical responses that are related to stress. Once the technique has been mastered, you do not need the equipment any more. Q: Are headaches hereditary? A: Four out of five (80%) of people that suffer report a family history of migraine. Scientists are not sure if this is genetic or a family predisposition. Despite the uncertainty, a child has a 50% chance of having migraine if one parent suffers. The child has a 75% chance if both parents suffer.  Q: Can children get headaches? A: By the time they reach high school, most young people have experienced some type of headache. Many safe and effective approaches or medications can prevent a headache from occurring or stop it after it has begun.  Q: What type of doctor should I see to diagnose and treat my headache? A: Start with your primary caregiver. Discuss his or her experience and approach to headaches. Discuss methods of classification, diagnosis, and treatment. Your caregiver may decide to recommend you to a headache specialist, depending upon your symptoms or other  physical conditions. Having diabetes, allergies, etc., may require a more comprehensive and inclusive approach to your headache. The National Head[employer] Foundation will provide, upon request, a list of [facility] physician members in your state. Document Released: 05/23/2003 Document Revised: 05/25/2011 Document Reviewed: 10/31/2007 [facility] Patient Information 2014 [place].  Nausea and Vomiting Nausea is a sick feeling that often comes before throwing up (vomiting). Vomiting is a reflex where stomach contents come out of your mouth. Vomiting can cause severe loss of body fluids (dehydration). Children and elderly adults can become dehydrated quickly, especially if they also have diarrhea. Nausea and vomiting are symptoms of a condition or disease. It is important to find the cause of your symptoms. CAUSES   Direct irritation of the stomach lining. This irritation can result from increased acid production (gastroesophageal reflux disease), infection, food poisoning, taking certain medicines (such as nonsteroidal anti-inflammatory drugs), alcohol use, or tobacco use.  Signals from the brain.These signals could be caused by a headache, heat exposure, an inner ear disturbance, increased pressure in the brain from injury, infection, a tumor, or a concussion, pain, emotional stimulus, or metabolic problems.  An obstruction in the gastrointestinal tract (bowel obstruction).  Illnesses such as diabetes, hepatitis, gallbladder problems, appendicitis, kidney problems, cancer, sepsis, atypical symptoms of a heart attack, or eating disorders.  Medical treatments such as chemotherapy and radiation.  Receiving medicine that makes you sleep (general anesthetic) during surgery. DIAGNOSIS Your caregiver may ask for tests to be done if the problems do  not improve after a few days. Tests may also be done if symptoms are severe or if the reason for the nausea and vomiting is not clear. Tests may include:  Urine  tests.  Blood tests.  Stool tests.  Cultures (to look for evidence of infection).  X-rays or other imaging studies. Test results can help your caregiver make decisions about treatment or the need for additional tests. TREATMENT You need to stay well hydrated. Drink frequently but in small amounts.You may wish to drink water, sports drinks, clear broth, or eat frozen ice pops or gelatin dessert to help stay hydrated.When you eat, eating slowly may help prevent nausea.There are also some antinausea medicines that may help prevent nausea. HOME CARE INSTRUCTIONS   Take all medicine as directed by your caregiver.  If you do not have an appetite, do not force yourself to eat. However, you must continue to drink fluids.  If you have an appetite, eat a normal diet unless your caregiver tells you differently.  Eat a variety of complex carbohydrates (rice, wheat, potatoes, bread), lean meats, yogurt, fruits, and vegetables.  Avoid high-fat foods because they are more difficult to digest.  Drink enough water and fluids to keep your urine clear or pale yellow.  If you are dehydrated, ask your caregiver for specific rehydration instructions. Signs of dehydration may include:  Severe thirst.  Dry lips and mouth.  Dizziness.  Dark urine.  Decreasing urine frequency and amount.  Confusion.  Rapid breathing or pulse. SEEK IMMEDIATE MEDICAL CARE IF:   You have blood or brown flecks (like coffee grounds) in your vomit.  You have black or bloody stools.  You have a severe headache or stiff neck.  You are confused.  You have severe abdominal pain.  You have chest pain or trouble breathing.  You do not urinate at least once every 8 hours.  You develop cold or clammy skin.  You continue to vomit for longer than 24 to 48 hours.  You have a fever. MAKE SURE YOU:   Understand these instructions.  Will watch your condition.  Will get help right away if you are not doing  well or get worse. Document Released: 03/02/2005 Document Revised: 05/25/2011 Document Reviewed: 07/30/2010 [facility] Patient Information 2014 [place], [place].

## 2013-04-12 NOTE — ED Notes (Signed)
Pt. reports headache onset yesterday " tingling " and nausea/vomitting ( x6 ) today .

## 2013-04-12 NOTE — ED Provider Notes (Signed)
CSN: [id number]     Arrival date & time 04/12/13  0056 History   First MD Initiated Contact with Patient 04/12/13 0130     Chief Complaint  Patient presents with  . Headache  . Emesis   (Consider location/radiation/quality/duration/timing/severity/associated sxs/prior Treatment) HPI 43 year old female presents emergency room with complaint of headache, photophobia, nausea and vomiting.  Headache started on Monday.  She reports it is a dull ache associated with some stinging and tingling in the posterior part.  She has had photophobia associated with it.  Today, she developed nausea, vomiting, and has had greater than 6 episodes.  No diarrhea, no bowel pain.  Patient has remote history of migraines, but reports this location and condition is different than her prior migraines.  No medications taken prior to arrival.  No fevers no chills.  No neck stiffness.  No recent URI symptoms. Past Medical History  Diagnosis Date  . Myocardial infarct   . Anemia   . Bronchitis    Past Surgical History  Procedure Laterality Date  . Eye surgery    . Abdominal hysterectomy    . Tubal ligation    . Eye surgery  10/16/10   No family history on file. History  Substance Use Topics  . Smoking status: Former Smoker -- 0.50 packs/day    Types: Cigarettes    Quit date: 03/15/2012  . Smokeless tobacco: Not on file  . Alcohol Use: Yes     Comment: occational   OB History   Grav Para Term Preterm Abortions TAB SAB Ect Mult Living                 Review of Systems  See History of Present Illness; otherwise all other systems are reviewed and negative Allergies  Medroxyprogesterone acetate; Pork-derived products; and Penicillins  Home Medications   Current Outpatient Rx  Name  Route  Sig  Dispense  Refill  . levalbuterol (XOPENEX HFA) 45 MCG/ACT inhaler   Inhalation   Inhale into the lungs every 6 (six) hours as needed for wheezing or shortness of breath.         . Multiple Vitamin  (MULTIVITAMIN WITH MINERALS) TABS tablet   Oral   Take 1 tablet by mouth daily. Women's formula vitamin         . OVER THE COUNTER MEDICATION   Oral   Take 2 capsules by mouth 3 (three) times daily before meals. Lipozene 1500mg          . ondansetron (ZOFRAN ODT) 8 MG disintegrating tablet   Oral   Take 1 tablet (8 mg total) by mouth every 8 (eight) hours as needed for nausea or vomiting.   20 tablet   0    BP 120/84  Pulse 84  Temp(Src) 98 F (36.7 C) (Oral)  Resp 16  SpO2 100% Physical Exam  Nursing note and vitals reviewed. Constitutional: She is oriented to person, place, and time. She appears well-developed and well-nourished. No distress.  HENT:  Head: Normocephalic and atraumatic.  Right Ear: External ear normal.  Left Ear: External ear normal.  Nose: Nose normal.  Mouth/Throat: Oropharynx is clear and moist.  Eyes: Conjunctivae and EOM are normal. Pupils are equal, round, and reactive to light.  Neck: Normal range of motion. Neck supple. No JVD present. No tracheal deviation present. No thyromegaly present.  Cardiovascular: Normal rate, regular rhythm, normal heart sounds and intact distal pulses.  Exam reveals no gallop and no friction rub.   No murmur heard. Pulmonary/Chest:  Effort normal and breath sounds normal. No stridor. No respiratory distress. She has no wheezes. She has no rales. She exhibits no tenderness.  Abdominal: Soft. Bowel sounds are normal. She exhibits no distension and no mass. There is no tenderness. There is no rebound and no guarding.  Musculoskeletal: Normal range of motion. She exhibits no edema and no tenderness.  Lymphadenopathy:    She has no cervical adenopathy.  Neurological: She is alert and oriented to person, place, and time. She has normal reflexes. No cranial nerve deficit. She exhibits normal muscle tone. Coordination normal.  Skin: Skin is warm and dry. No rash noted. No erythema. No pallor.  Psychiatric: She has a normal mood  and affect. Her behavior is normal. Judgment and thought content normal.    ED Course  Procedures (including critical care time) Labs Review Labs Reviewed - No data to display Imaging Review No results found.  EKG Interpretation   None       MDM   1. Headache   2. Nausea and vomiting    She with resolution of symptoms after headache cocktail.  To followup with primary care physician.    Kalman Drape, MD 04/12/13 [phone]

## 2013-04-12 NOTE — ED Notes (Signed)
Pt given d/c instructions and verbalized understanding.  

## 2013-04-15 ENCOUNTER — Emergency Department (HOSPITAL_COMMUNITY): Payer: Medicaid Other

## 2013-04-15 ENCOUNTER — Emergency Department (HOSPITAL_COMMUNITY)
Admission: EM | Admit: 2013-04-15 | Discharge: 2013-04-15 | Disposition: A | Discharge status: Home / Self Care | Payer: Medicaid Other | Attending: Emergency Medicine | Admitting: Emergency Medicine

## 2013-04-15 ENCOUNTER — Encounter (HOSPITAL_COMMUNITY): Payer: Self-pay | Admitting: Emergency Medicine

## 2013-04-15 DIAGNOSIS — R112 Nausea with vomiting, unspecified: Secondary | ICD-10-CM | POA: Insufficient documentation

## 2013-04-15 DIAGNOSIS — Z862 Personal history of diseases of the blood and blood-forming organs and certain disorders involving the immune mechanism: Secondary | ICD-10-CM | POA: Insufficient documentation

## 2013-04-15 DIAGNOSIS — Z880 Allergy status to penicillin: Secondary | ICD-10-CM | POA: Insufficient documentation

## 2013-04-15 DIAGNOSIS — I252 Old myocardial infarction: Secondary | ICD-10-CM | POA: Insufficient documentation

## 2013-04-15 DIAGNOSIS — R519 Headache, unspecified: Secondary | ICD-10-CM

## 2013-04-15 DIAGNOSIS — Z79899 Other long term (current) drug therapy: Secondary | ICD-10-CM | POA: Insufficient documentation

## 2013-04-15 DIAGNOSIS — R51 Headache: Secondary | ICD-10-CM | POA: Insufficient documentation

## 2013-04-15 DIAGNOSIS — H53149 Visual discomfort, unspecified: Secondary | ICD-10-CM | POA: Insufficient documentation

## 2013-04-15 DIAGNOSIS — H9319 Tinnitus, unspecified ear: Secondary | ICD-10-CM | POA: Insufficient documentation

## 2013-04-15 DIAGNOSIS — Z87891 Personal history of nicotine dependence: Secondary | ICD-10-CM | POA: Insufficient documentation

## 2013-04-15 LAB — POCT I-STAT, CHEM 8
BUN: 17 mg/dL (ref 6–23)
Calcium, Ion: 1.21 mmol/L (ref 1.12–1.23)
Chloride: 106 mEq/L (ref 96–112)
Creatinine, Ser: 0.8 mg/dL (ref 0.50–1.10)
GLUCOSE: 94 mg/dL (ref 70–99)
HCT: 40 % (ref 36.0–46.0)
HEMOGLOBIN: 13.6 g/dL (ref 12.0–15.0)
Potassium: 4.4 mEq/L (ref 3.7–5.3)
Sodium: 140 mEq/L (ref 137–147)
TCO2: 26 mmol/L (ref 0–100)

## 2013-04-15 MED ORDER — DIPHENHYDRAMINE HCL 50 MG/ML IJ SOLN
25.0000 mg | Freq: Once | INTRAMUSCULAR | Status: AC
  Administered 2013-04-15: 25 mg via INTRAVENOUS

## 2013-04-15 MED ORDER — TETRACAINE HCL 0.5 % OP SOLN
1.0000 [drp] | Freq: Once | OPHTHALMIC | Status: AC
  Administered 2013-04-15: 1 [drp] via OPHTHALMIC
  Filled 2013-04-15: qty 2

## 2013-04-15 MED ORDER — DIPHENHYDRAMINE HCL 50 MG/ML IJ SOLN
25.0000 mg | Freq: Once | INTRAMUSCULAR | Status: DC
  Filled 2013-04-15: qty 1

## 2013-04-15 MED ORDER — DEXAMETHASONE SODIUM PHOSPHATE 10 MG/ML IJ SOLN
10.0000 mg | Freq: Once | INTRAMUSCULAR | Status: AC
Start: 2013-04-15 — End: 2013-04-15
  Administered 2013-04-15: 10 mg via INTRAVENOUS

## 2013-04-15 MED ORDER — METOCLOPRAMIDE HCL 5 MG/ML IJ SOLN
10.0000 mg | Freq: Once | INTRAMUSCULAR | Status: DC
  Filled 2013-04-15: qty 2

## 2013-04-15 MED ORDER — DEXAMETHASONE SODIUM PHOSPHATE 10 MG/ML IJ SOLN
10.0000 mg | Freq: Once | INTRAMUSCULAR | Status: DC
  Filled 2013-04-15: qty 1

## 2013-04-15 MED ORDER — METOCLOPRAMIDE HCL 5 MG/ML IJ SOLN
10.0000 mg | INTRAMUSCULAR | Status: AC
  Administered 2013-04-15: 10 mg via INTRAVENOUS

## 2013-04-15 NOTE — ED Notes (Signed)
Pt c/o migraine x1week. Pt was seen 3 days ago for same thing. Pt c/o nausea, headache 9/10 pain. Pt a7ox4. Pt c/o light and sound sensitivity. Pt denies double vision or blurry vision.

## 2013-04-15 NOTE — Discharge Instructions (Signed)

## 2013-04-15 NOTE — ED Provider Notes (Signed)
Medical screening examination/treatment/procedure(s) were performed by non-physician practitioner and as supervising physician I was immediately available for consultation/collaboration.   Keshawn Sundberg, MD 04/15/13 0754 

## 2013-04-15 NOTE — ED Notes (Signed)
Pt arrived to the ED with a complaint of a migraine headache that is located in the back of her head.  Pt states she was prescribed medication but has seen no relief.  Pt states that it has caused her nausea.

## 2013-04-15 NOTE — ED Provider Notes (Signed)
CSN: [id number]     Arrival date & time 04/15/13  0002 History   First MD Initiated Contact with Patient 04/15/13 0148     Chief Complaint  Patient presents with  . Migraine   (Consider location/radiation/quality/duration/timing/severity/associated sxs/prior Treatment) HPI Comments: Patient is a 43 year old female with a history of migraine headaches, per patient, who presents today for headache x5 days. Patient states that headache has been constant since onset. She was seen 3 days ago for this complaint, but states that treatment received in ED did not help her. She was discharged with Zofran as she has been taking for symptoms without relief. Patient states that pain is present in her left occipital region and radiates to her left frontal region. She states that the location of this headache is different than headaches she has had in the past. Symptoms associated with bilateral tinnitus, photophobia, phonophobia, nausea, and nonbloody/nonbilious emesis. Last episode of emesis was prior to arrival. Patient denies associated fever, vision loss, high pain, difficulty speaking or swallowing, hearing loss, neck pain or stiffness, numbness/tingling, extremity weakness, seizure activity, and syncope.   She denies a history of stroke but states she has had a heart attack in 2009. While patient's chart does endorse "myocardial infarction", her admission for chest pain in 2009 showed a clear cardiac cath. She is not on any hypertensive or cholesterol medications.  Patient is a 43 y.o. female presenting with migraines. The history is provided by the patient. No language interpreter was used.  Migraine Associated symptoms include headaches, nausea and vomiting. Pertinent negatives include no chest pain, fever, neck pain, numbness or weakness.    Past Medical History  Diagnosis Date  . Myocardial infarct   . Anemia   . Bronchitis    Past Surgical History  Procedure Laterality Date  . Eye surgery    .  Abdominal hysterectomy    . Tubal ligation    . Eye surgery  10/16/10   History reviewed. No pertinent family history. History  Substance Use Topics  . Smoking status: Former Smoker -- 0.50 packs/day    Types: Cigarettes    Quit date: 03/15/2012  . Smokeless tobacco: Not on file  . Alcohol Use: Yes     Comment: occational   OB History   Grav Para Term Preterm Abortions TAB SAB Ect Mult Living                 Review of Systems  Constitutional: Negative for fever.  HENT: Positive for tinnitus. Negative for hearing loss and trouble swallowing.   Eyes: Positive for photophobia. Negative for pain and visual disturbance.  Respiratory: Negative for shortness of breath.   Cardiovascular: Negative for chest pain.  Gastrointestinal: Positive for nausea and vomiting.  Musculoskeletal: Negative for neck pain and neck stiffness.  Neurological: Positive for headaches. Negative for syncope, speech difficulty, weakness and numbness.  All other systems reviewed and are negative.    Allergies  Medroxyprogesterone acetate; Pork-derived products; and Penicillins  Home Medications   Current Outpatient Rx  Name  Route  Sig  Dispense  Refill  . Acetaminophen-Caff-Pyrilamine (MIDOL COMPLETE PO)   Oral   Take 2 tablets by mouth 2 (two) times daily as needed (pain).         Marland Kitchen diphenhydrAMINE (BENADRYL) 25 MG tablet   Oral   Take 25 mg by mouth every 6 (six) hours as needed for allergies or sleep.         Marland Kitchen ibuprofen (ADVIL,MOTRIN) 200 MG tablet  Oral   Take 400 mg by mouth every 6 (six) hours as needed for headache.         . levalbuterol (XOPENEX HFA) 45 MCG/ACT inhaler   Inhalation   Inhale into the lungs every 6 (six) hours as needed for wheezing or shortness of breath.         . Multiple Vitamin (MULTIVITAMIN WITH MINERALS) TABS tablet   Oral   Take 1 tablet by mouth daily. Women's formula vitamin         . ondansetron (ZOFRAN ODT) 8 MG disintegrating tablet   Oral    Take 1 tablet (8 mg total) by mouth every 8 (eight) hours as needed for nausea or vomiting.   20 tablet   0    BP 126/77  Pulse 76  Temp(Src) 98 F (36.7 C) (Oral)  Resp 20  SpO2 97%  Physical Exam  Nursing note and vitals reviewed. Constitutional: She is oriented to person, place, and time. She appears well-developed and well-nourished. No distress.  Patient hiding under covers to shield herself from the light. She is nontoxic appearing and in no acute distress.  HENT:  Head: Normocephalic and atraumatic.  Mouth/Throat: Oropharynx is clear and moist. No oropharyngeal exudate.  Eyes: Conjunctivae and EOM are normal. Pupils are equal, round, and reactive to light. No scleral icterus.  Pupils equal round and reactive to direct and consensual light. IOP 19 OD with 95% CI and 18 OS with 95% CI.  Neck: Normal range of motion. Neck supple.  No nuchal rigidity or meningismus.  Cardiovascular: Normal rate, regular rhythm and normal heart sounds.   Pulmonary/Chest: Effort normal and breath sounds normal. No respiratory distress. She has no wheezes. She has no rales.  Musculoskeletal: Normal range of motion.  Neurological: She is alert and oriented to person, place, and time. She has normal reflexes. No cranial nerve deficit. She exhibits normal muscle tone. Coordination normal.  GCS 15. Patient speaks in full goal oriented sentences. Cranial nerves grossly intact; symmetric eyebrow raise, no facial drooping, equal tongue protrusion, and normal shoulder shrugging. Grip strength equal and normal bilaterally. Patient has 5/5 strength against resistance in all extremities. She moves her extremities without ataxia. No gross sensory deficits appreciated.  Skin: Skin is warm and dry. No rash noted. She is not diaphoretic. No erythema. No pallor.  Psychiatric: She has a normal mood and affect. Her behavior is normal.    ED Course  Procedures (including critical care time) Labs Review Labs Reviewed   POCT I-STAT, CHEM 8   Imaging Review Ct Head Wo Contrast  04/15/2013   CLINICAL DATA:  Migraine, nausea, headache  EXAM: CT HEAD WITHOUT CONTRAST  TECHNIQUE: Contiguous axial images were obtained from the base of the skull through the vertex without intravenous contrast.  COMPARISON:  CT HEAD W/O CM dated 06/10/2012  FINDINGS: No acute intracranial hemorrhage. No focal mass lesion. No CT evidence of acute infarction. No midline shift or mass effect. No hydrocephalus. Basilar cisterns are patent.  Mild periventricular white matter hypodensities.  Remote micro plate fixation of the inferior left orbit. Paranasal sinuses and mastoid air cells are clear.  IMPRESSION: No acute intracranial findings.   Electronically Signed   By: Suzy Bouchard M.D.   On: 04/15/2013 02:33    EKG Interpretation   None       MDM   1. Headache    7369 - 43 year old female with a history of migraine presents for headache x5 days. Patient denies any  trauma or injury inciting her pain. Neurologic exam today is nonfocal. Patient is neurovascularly intact. IOPs normal b/l. Given her return to the ED since last seen 3 days ago, will obtain CT head and chemistries to further evaluate symptoms. Decadron, Reglan, and Benadryl ordered for symptoms.  0400 - Patient is sleepy in exam room bed. She states she is pain free. CT imaging negative for hemorrhage, hydrocephalus, mass lesion, and midline shift. Do not believe further emergent workup is indicated at this time. Patient stable and appropriate for discharge with instruction to followup with her primary care provider. Return precautions provided and patient agreeable to plan with no unaddressed concerns.   Filed Vitals:   04/15/13 0014  BP: 126/77  Pulse: 76  Temp: 98 F (36.7 C)  TempSrc: Oral  Resp: 20  SpO2: 97%     Antonietta Breach, PA-C 04/15/13 [phone]

## 2013-08-07 ENCOUNTER — Encounter (HOSPITAL_COMMUNITY): Payer: Self-pay | Admitting: Emergency Medicine

## 2013-08-07 ENCOUNTER — Emergency Department (HOSPITAL_COMMUNITY): Payer: Medicaid Other

## 2013-08-07 ENCOUNTER — Emergency Department (HOSPITAL_COMMUNITY)
Admission: EM | Admit: 2013-08-07 | Discharge: 2013-08-07 | Disposition: A | Discharge status: Home / Self Care | Payer: Medicaid Other | Attending: Emergency Medicine | Admitting: Emergency Medicine

## 2013-08-07 DIAGNOSIS — Z79899 Other long term (current) drug therapy: Secondary | ICD-10-CM | POA: Insufficient documentation

## 2013-08-07 DIAGNOSIS — M542 Cervicalgia: Secondary | ICD-10-CM

## 2013-08-07 DIAGNOSIS — Z862 Personal history of diseases of the blood and blood-forming organs and certain disorders involving the immune mechanism: Secondary | ICD-10-CM | POA: Insufficient documentation

## 2013-08-07 DIAGNOSIS — Z880 Allergy status to penicillin: Secondary | ICD-10-CM | POA: Insufficient documentation

## 2013-08-07 DIAGNOSIS — I252 Old myocardial infarction: Secondary | ICD-10-CM | POA: Insufficient documentation

## 2013-08-07 DIAGNOSIS — Y92410 Unspecified street and highway as the place of occurrence of the external cause: Secondary | ICD-10-CM | POA: Insufficient documentation

## 2013-08-07 DIAGNOSIS — S0993XA Unspecified injury of face, initial encounter: Secondary | ICD-10-CM | POA: Insufficient documentation

## 2013-08-07 DIAGNOSIS — Z8709 Personal history of other diseases of the respiratory system: Secondary | ICD-10-CM | POA: Insufficient documentation

## 2013-08-07 DIAGNOSIS — Y9389 Activity, other specified: Secondary | ICD-10-CM | POA: Insufficient documentation

## 2013-08-07 DIAGNOSIS — S199XXA Unspecified injury of neck, initial encounter: Principal | ICD-10-CM

## 2013-08-07 MED ORDER — METHOCARBAMOL 500 MG PO TABS: 500.0000 mg | ORAL_TABLET | Freq: Two times a day (BID) | ORAL | Status: DC
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
## 2013-08-10 NOTE — ED Provider Notes (Signed)
Medical screening examination/treatment/procedure(s) were performed by non-physician practitioner and as supervising physician I was immediately available for consultation/collaboration.   EKG Interpretation None       Jasper Riling. Alvino Chapel, MD 08/10/13 3151

## 2013-10-03 ENCOUNTER — Ambulatory Visit: Payer: No Typology Code available for payment source | Admitting: Physical Therapy

## 2013-10-05 ENCOUNTER — Ambulatory Visit: Payer: No Typology Code available for payment source | Admitting: Physical Therapy

## 2013-10-11 ENCOUNTER — Encounter (HOSPITAL_COMMUNITY): Payer: Self-pay | Admitting: Emergency Medicine

## 2013-10-11 ENCOUNTER — Emergency Department (HOSPITAL_COMMUNITY)
Admission: EM | Admit: 2013-10-11 | Discharge: 2013-10-11 | Disposition: A | Discharge status: Home / Self Care | Payer: Medicaid Other | Attending: Emergency Medicine | Admitting: Emergency Medicine

## 2013-10-11 DIAGNOSIS — Z87891 Personal history of nicotine dependence: Secondary | ICD-10-CM | POA: Diagnosis not present

## 2013-10-11 DIAGNOSIS — Z862 Personal history of diseases of the blood and blood-forming organs and certain disorders involving the immune mechanism: Secondary | ICD-10-CM | POA: Diagnosis not present

## 2013-10-11 DIAGNOSIS — I252 Old myocardial infarction: Secondary | ICD-10-CM | POA: Insufficient documentation

## 2013-10-11 DIAGNOSIS — Z8709 Personal history of other diseases of the respiratory system: Secondary | ICD-10-CM | POA: Diagnosis not present

## 2013-10-11 DIAGNOSIS — Z79899 Other long term (current) drug therapy: Secondary | ICD-10-CM | POA: Insufficient documentation

## 2013-10-11 DIAGNOSIS — M545 Low back pain, unspecified: Secondary | ICD-10-CM

## 2013-10-11 MED ORDER — OXYCODONE-ACETAMINOPHEN 5-325 MG PO TABS
1.0000 | ORAL_TABLET | Freq: Once | ORAL | Status: AC
  Administered 2013-10-11: 1 via ORAL
  Filled 2013-10-11: qty 1

## 2013-10-11 MED ORDER — ONDANSETRON 4 MG PO TBDP
4.0000 mg | ORAL_TABLET | Freq: Once | ORAL | Status: AC
Start: 2013-10-11 — End: 2013-10-11
  Administered 2013-10-11: 4 mg via ORAL
  Filled 2013-10-11: qty 1

## 2013-10-11 MED ORDER — HYDROMORPHONE HCL PF 1 MG/ML IJ SOLN
2.0000 mg | Freq: Once | INTRAMUSCULAR | Status: AC
  Administered 2013-10-11: 2 mg via INTRAMUSCULAR
  Filled 2013-10-11: qty 2

## 2013-10-11 MED ORDER — HYDROCODONE-ACETAMINOPHEN 5-325 MG PO TABS
ORAL_TABLET | ORAL | Status: DC
Start: 2013-10-11 — End: 2014-05-19

## 2013-10-11 MED ORDER — METHOCARBAMOL 500 MG PO TABS: 1000.0000 mg | ORAL_TABLET | Freq: Four times a day (QID) | ORAL | Status: DC

## 2013-10-11 MED ORDER — NAPROXEN 500 MG PO TABS: 500.0000 mg | ORAL_TABLET | Freq: Two times a day (BID) | ORAL | Status: DC

## 2013-10-11 NOTE — ED Provider Notes (Signed)
Medical screening examination/treatment/procedure(s) were performed by non-physician practitioner and as supervising physician I was immediately available for consultation/collaboration.   EKG Interpretation None        Osvaldo Shipper, MD 10/11/13 1446
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
## 2013-10-11 NOTE — Discharge Instructions (Signed)
Please read and follow all provided instructions.  Your diagnoses today include:  1. Left-sided low back pain without sciatica     Tests performed today include:  Vital signs - see below for your results today  Medications prescribed:   Vicodin (hydrocodone/acetaminophen) - narcotic pain medication  DO NOT drive or perform any activities that require you to be awake and alert because this medicine can make you drowsy. BE VERY CAREFUL not to take multiple medicines containing Tylenol (also called acetaminophen). Doing so can lead to an overdose which can damage your liver and cause liver failure and possibly death.   Naproxen - anti-inflammatory pain medication  Do not exceed 500mg  naproxen every 12 hours, take with food  You have been prescribed an anti-inflammatory medication or NSAID. Take with food. Take smallest effective dose for the shortest duration needed for your pain. Stop taking if you experience stomach pain or vomiting.    Robaxin (methocarbamol) - muscle relaxer medication  DO NOT drive or perform any activities that require you to be awake and alert because this medicine can make you drowsy.   Take any prescribed medications only as directed.  Home care instructions:   Follow any educational materials contained in this packet  Please rest, use ice or heat on your back for the next several days  Do not lift, push, pull anything more than 10 pounds for the next week  Follow-up instructions: Please follow-up with your primary care provider in the next 1 week for further evaluation of your symptoms.   Return instructions:  SEEK IMMEDIATE MEDICAL ATTENTION IF YOU HAVE:  New numbness, tingling, weakness, or problem with the use of your arms or legs  Severe back pain not relieved with medications  Loss control of your bowels or bladder  Increasing pain in any areas of the body (such as chest or abdominal pain)  Shortness of breath, dizziness, or fainting.     Worsening nausea (feeling sick to your stomach), vomiting, fever, or sweats  Any other emergent concerns regarding your health   Additional Information:  Your vital signs today were: BP 141/93   Pulse 84   Temp(Src) 98.1 F (36.7 C) (Oral)   Resp 17   Ht 6' (1.829 m)   Wt 287 lb (130.182 kg)   BMI 38.92 kg/m2   SpO2 98% If your blood pressure (BP) was elevated above 135/85 this visit, please have this repeated by your doctor within one month. --------------

## 2013-10-17 ENCOUNTER — Ambulatory Visit: Payer: Medicaid Other | Attending: Family Medicine | Admitting: Physical Therapy

## 2013-10-17 DIAGNOSIS — M545 Low back pain, unspecified: Secondary | ICD-10-CM | POA: Insufficient documentation

## 2013-10-17 DIAGNOSIS — M542 Cervicalgia: Secondary | ICD-10-CM | POA: Insufficient documentation

## 2013-10-19 ENCOUNTER — Ambulatory Visit: Payer: Medicaid Other | Admitting: Physical Therapy

## 2013-10-19 DIAGNOSIS — M542 Cervicalgia: Secondary | ICD-10-CM | POA: Diagnosis not present

## 2013-10-24 ENCOUNTER — Ambulatory Visit: Payer: Medicaid Other | Admitting: Physical Therapy

## 2013-10-26 ENCOUNTER — Ambulatory Visit: Payer: Medicaid Other | Admitting: Physical Therapy

## 2013-10-26 DIAGNOSIS — M542 Cervicalgia: Secondary | ICD-10-CM | POA: Diagnosis not present

## 2013-10-31 ENCOUNTER — Ambulatory Visit: Payer: Medicaid Other | Admitting: Physical Therapy

## 2013-10-31 DIAGNOSIS — M542 Cervicalgia: Secondary | ICD-10-CM | POA: Diagnosis not present

## 2013-11-02 ENCOUNTER — Ambulatory Visit: Payer: Medicaid Other | Admitting: Physical Therapy

## 2013-11-02 ENCOUNTER — Encounter: Payer: Medicaid Other | Admitting: Physical Therapy

## 2013-11-09 ENCOUNTER — Ambulatory Visit: Payer: Medicaid Other | Admitting: Physical Therapy

## 2013-11-14 ENCOUNTER — Ambulatory Visit: Payer: Medicaid Other | Attending: Family Medicine | Admitting: Physical Therapy

## 2013-11-14 DIAGNOSIS — M542 Cervicalgia: Secondary | ICD-10-CM | POA: Diagnosis present

## 2013-11-14 DIAGNOSIS — M545 Low back pain, unspecified: Secondary | ICD-10-CM | POA: Insufficient documentation

## 2013-11-21 ENCOUNTER — Ambulatory Visit: Payer: Medicaid Other | Admitting: Physical Therapy

## 2013-11-23 ENCOUNTER — Ambulatory Visit: Payer: Medicaid Other | Admitting: Physical Therapy

## 2013-11-27 ENCOUNTER — Ambulatory Visit: Payer: Medicaid Other | Admitting: Physical Therapy

## 2013-11-29 ENCOUNTER — Ambulatory Visit: Payer: Medicaid Other | Admitting: Physical Therapy

## 2013-12-05 ENCOUNTER — Ambulatory Visit: Payer: Medicaid Other | Admitting: Physical Therapy

## 2013-12-07 ENCOUNTER — Ambulatory Visit: Payer: Medicaid Other | Admitting: Physical Therapy

## 2013-12-07 DIAGNOSIS — M542 Cervicalgia: Secondary | ICD-10-CM | POA: Diagnosis not present

## 2013-12-12 ENCOUNTER — Ambulatory Visit: Payer: Medicaid Other | Admitting: Physical Therapy

## 2013-12-14 ENCOUNTER — Ambulatory Visit: Payer: No Typology Code available for payment source | Attending: Family Medicine | Admitting: Physical Therapy

## 2014-01-21 IMAGING — CR DG FOOT COMPLETE 3+V*L*
3 series · 3 of 3 positions shown · non-contrast
Comparison: None

CLINICAL DATA: Anterior foot pain post fall

LEFT FOOT - COMPLETE 3+ VIEW

[t foot ap left]
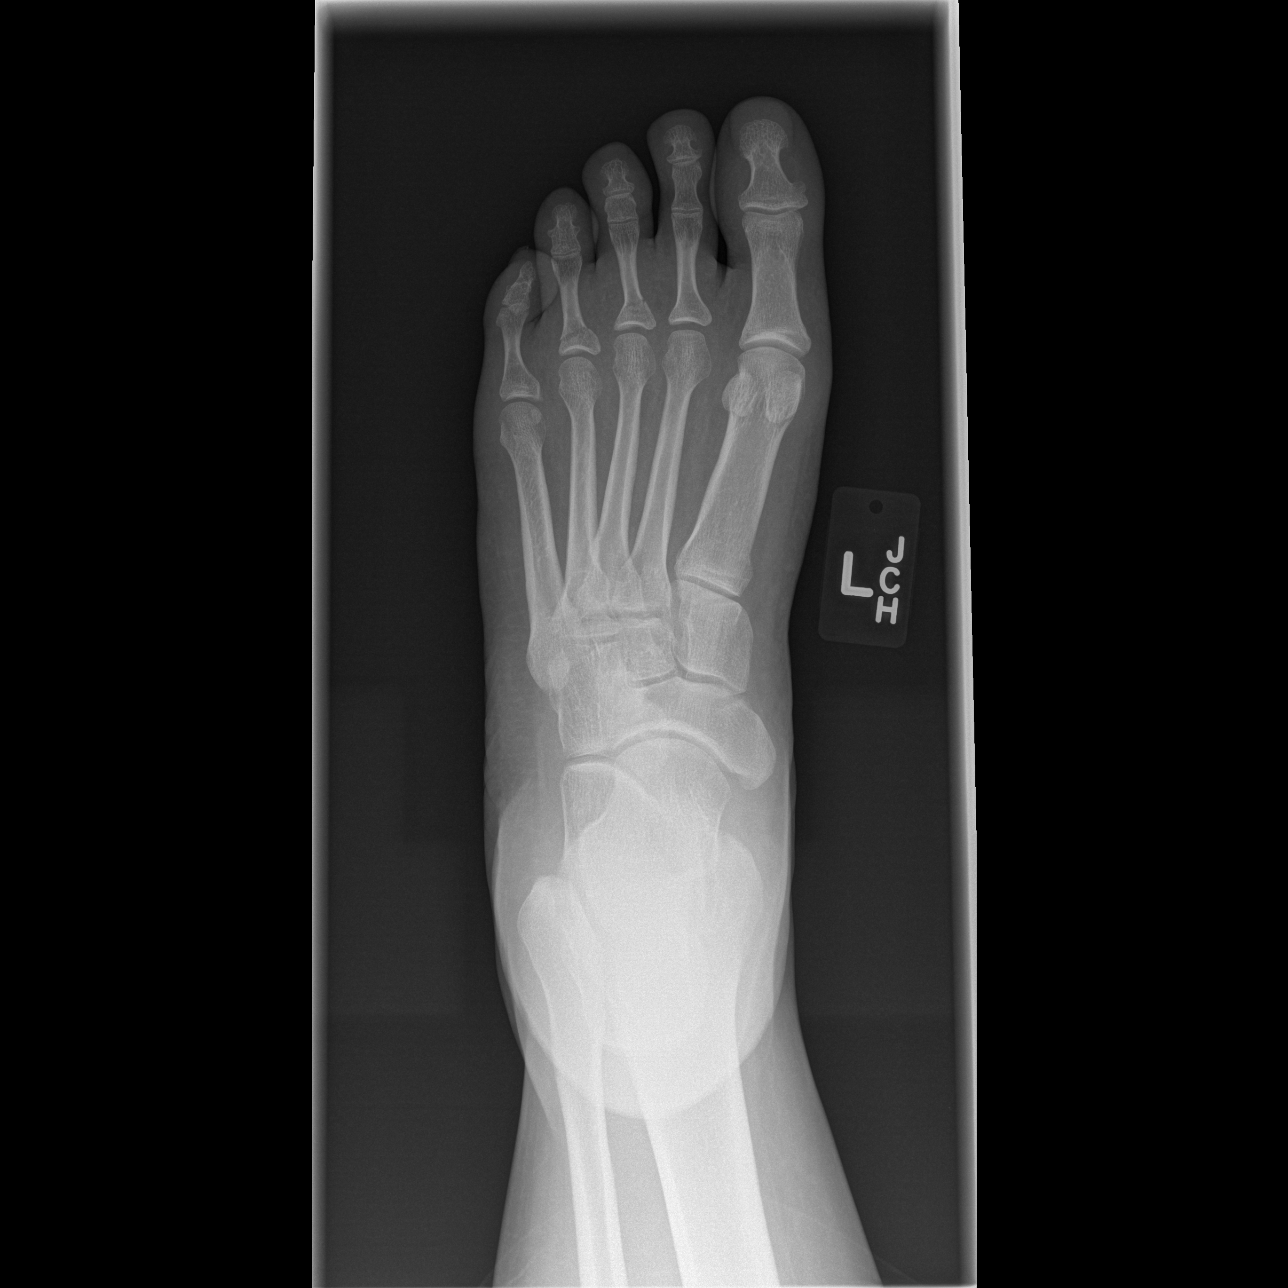

[t foot oblique left]
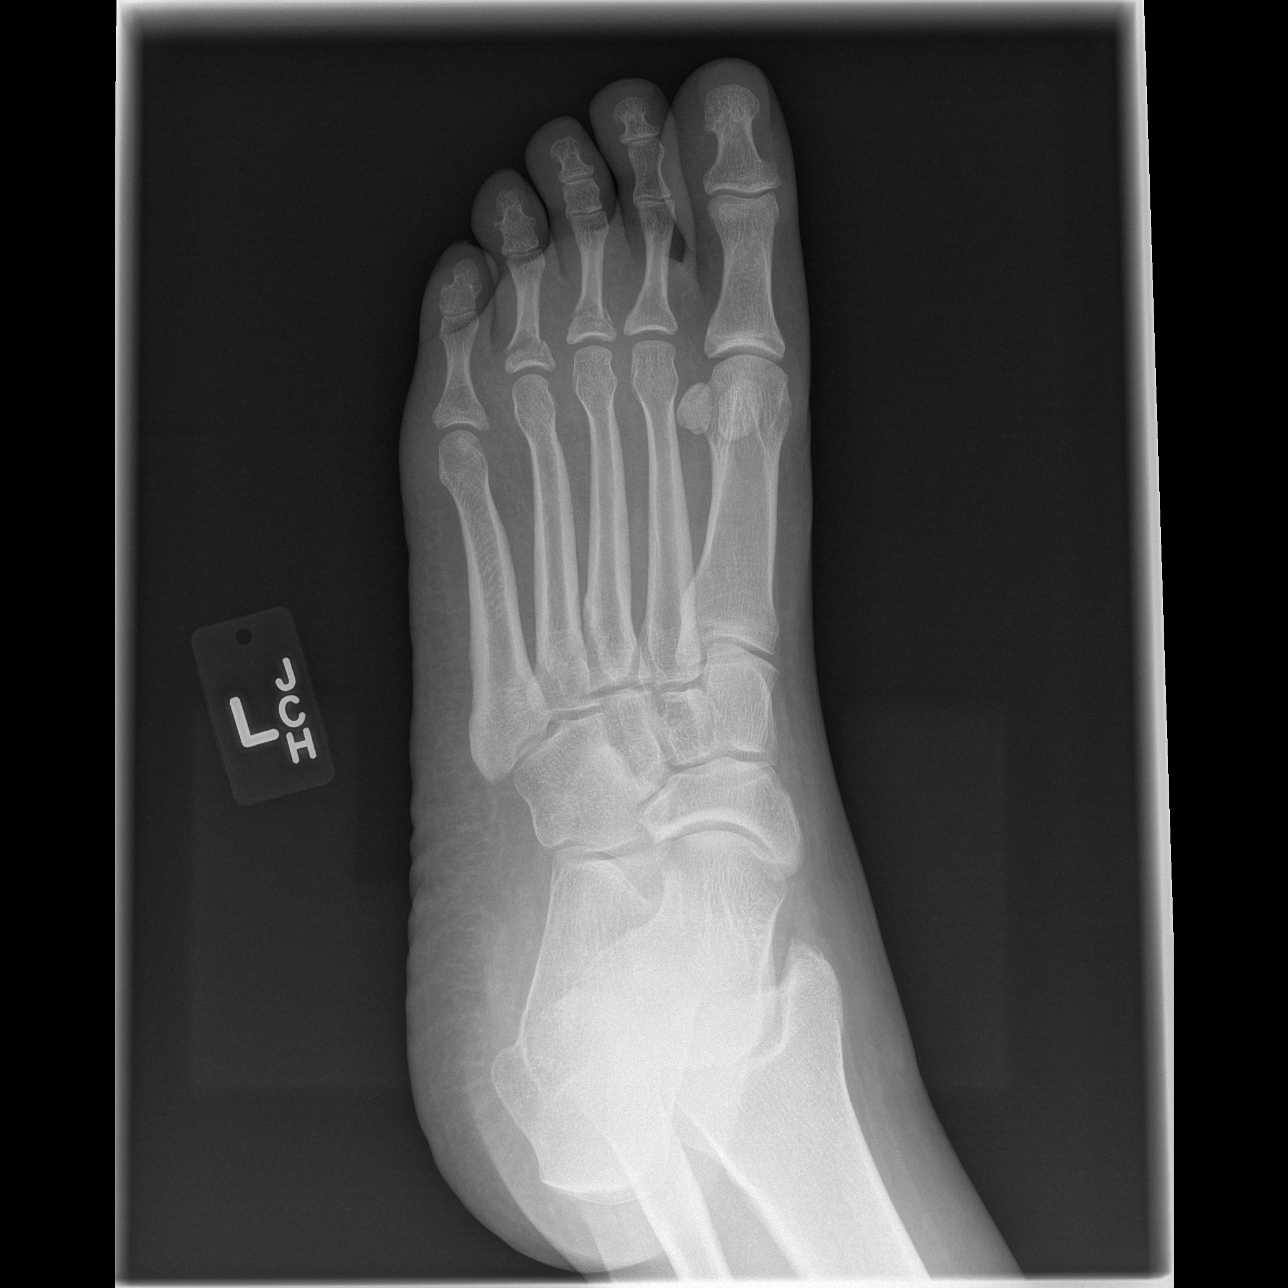

[t foot lat left]
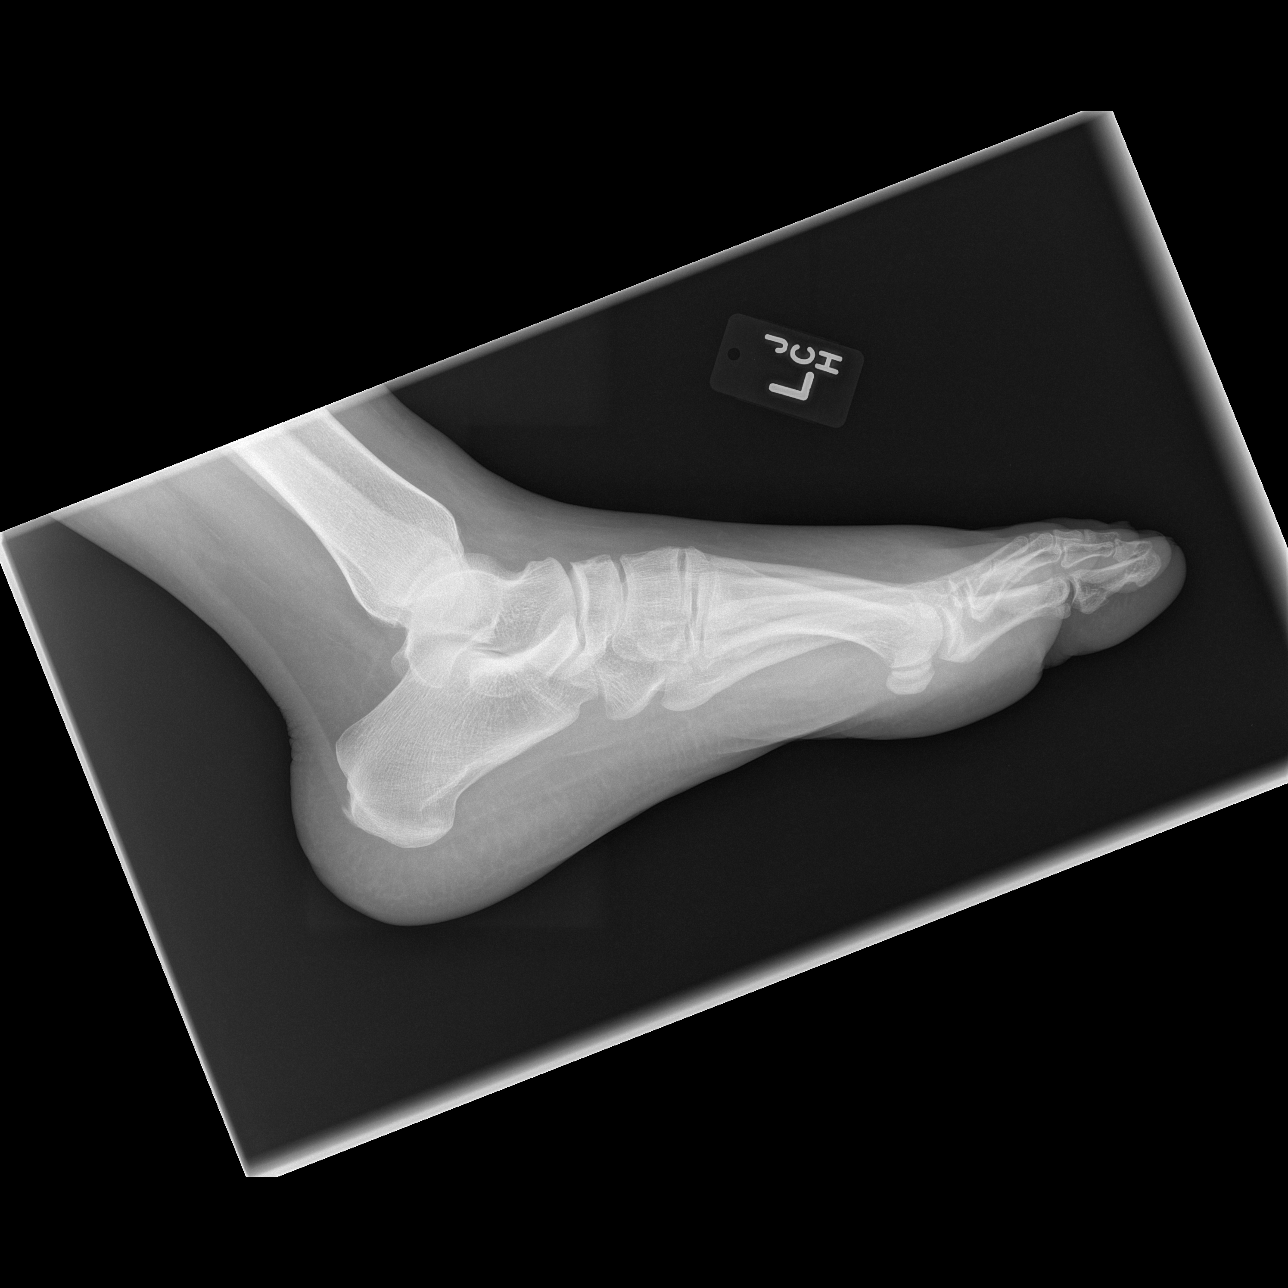

[3 of 3 positions shown; findings below may reference images not displayed]

FINDINGS: Osseous mineralization normal.
Transverse fractures at bases of proximal phalanges of the left
third and fourth toes, minimally displaced.
No intra-articular extension.
Joint spaces preserved.
No additional fracture or dislocation identified.
Dorsal soft tissue swelling at forefoot.
IMPRESSION: Minimally displaced fractures at the proximal phalanges of the left
third and fourth toes as above.

## 2014-04-05 IMAGING — CR DG CHEST 2V
2 series · 2 of 2 positions shown · non-contrast
Comparison: 02/23/2009

CLINICAL DATA: Fever, cough, shortness of breath

CHEST - 2 VIEW

[w chest pa]
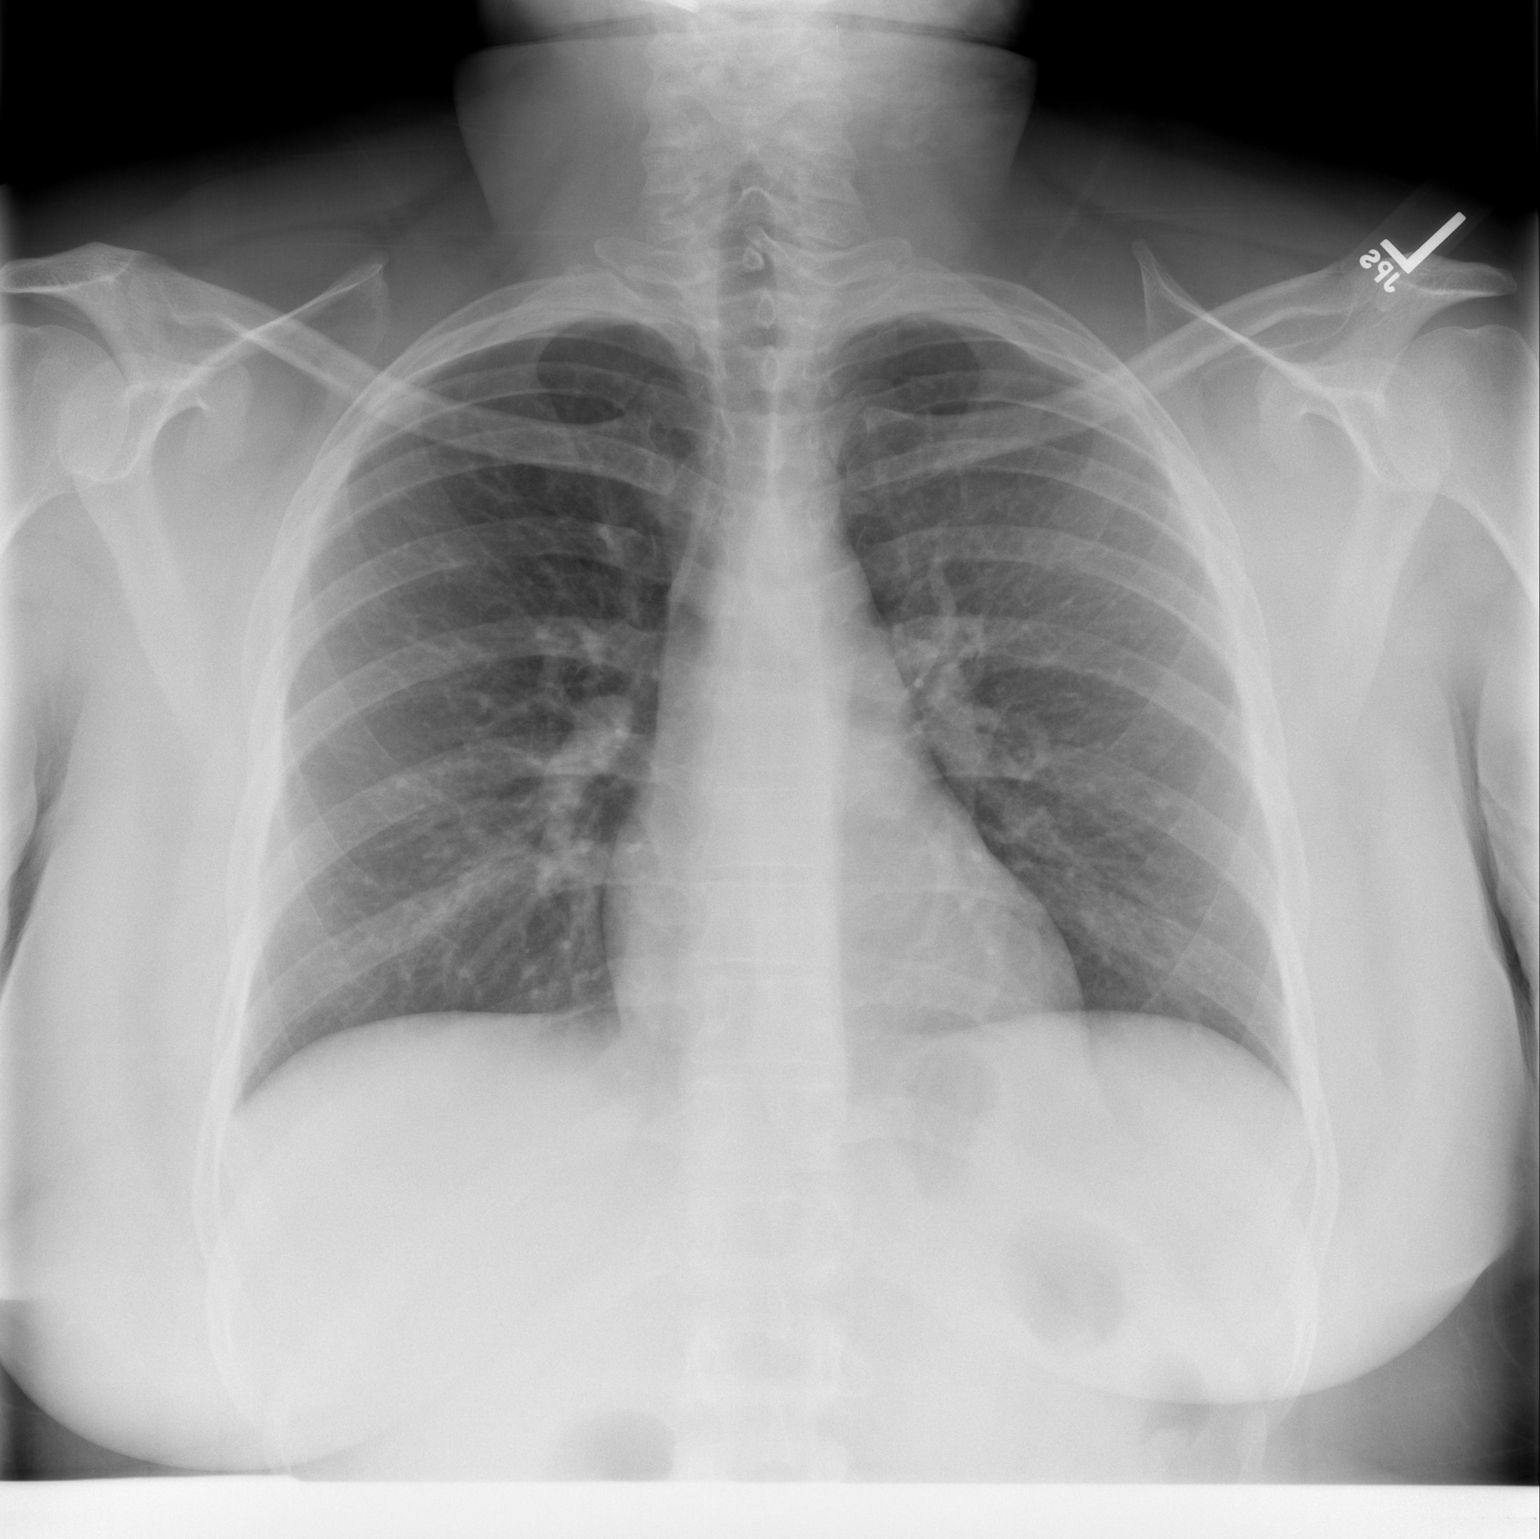

[w chest lat]
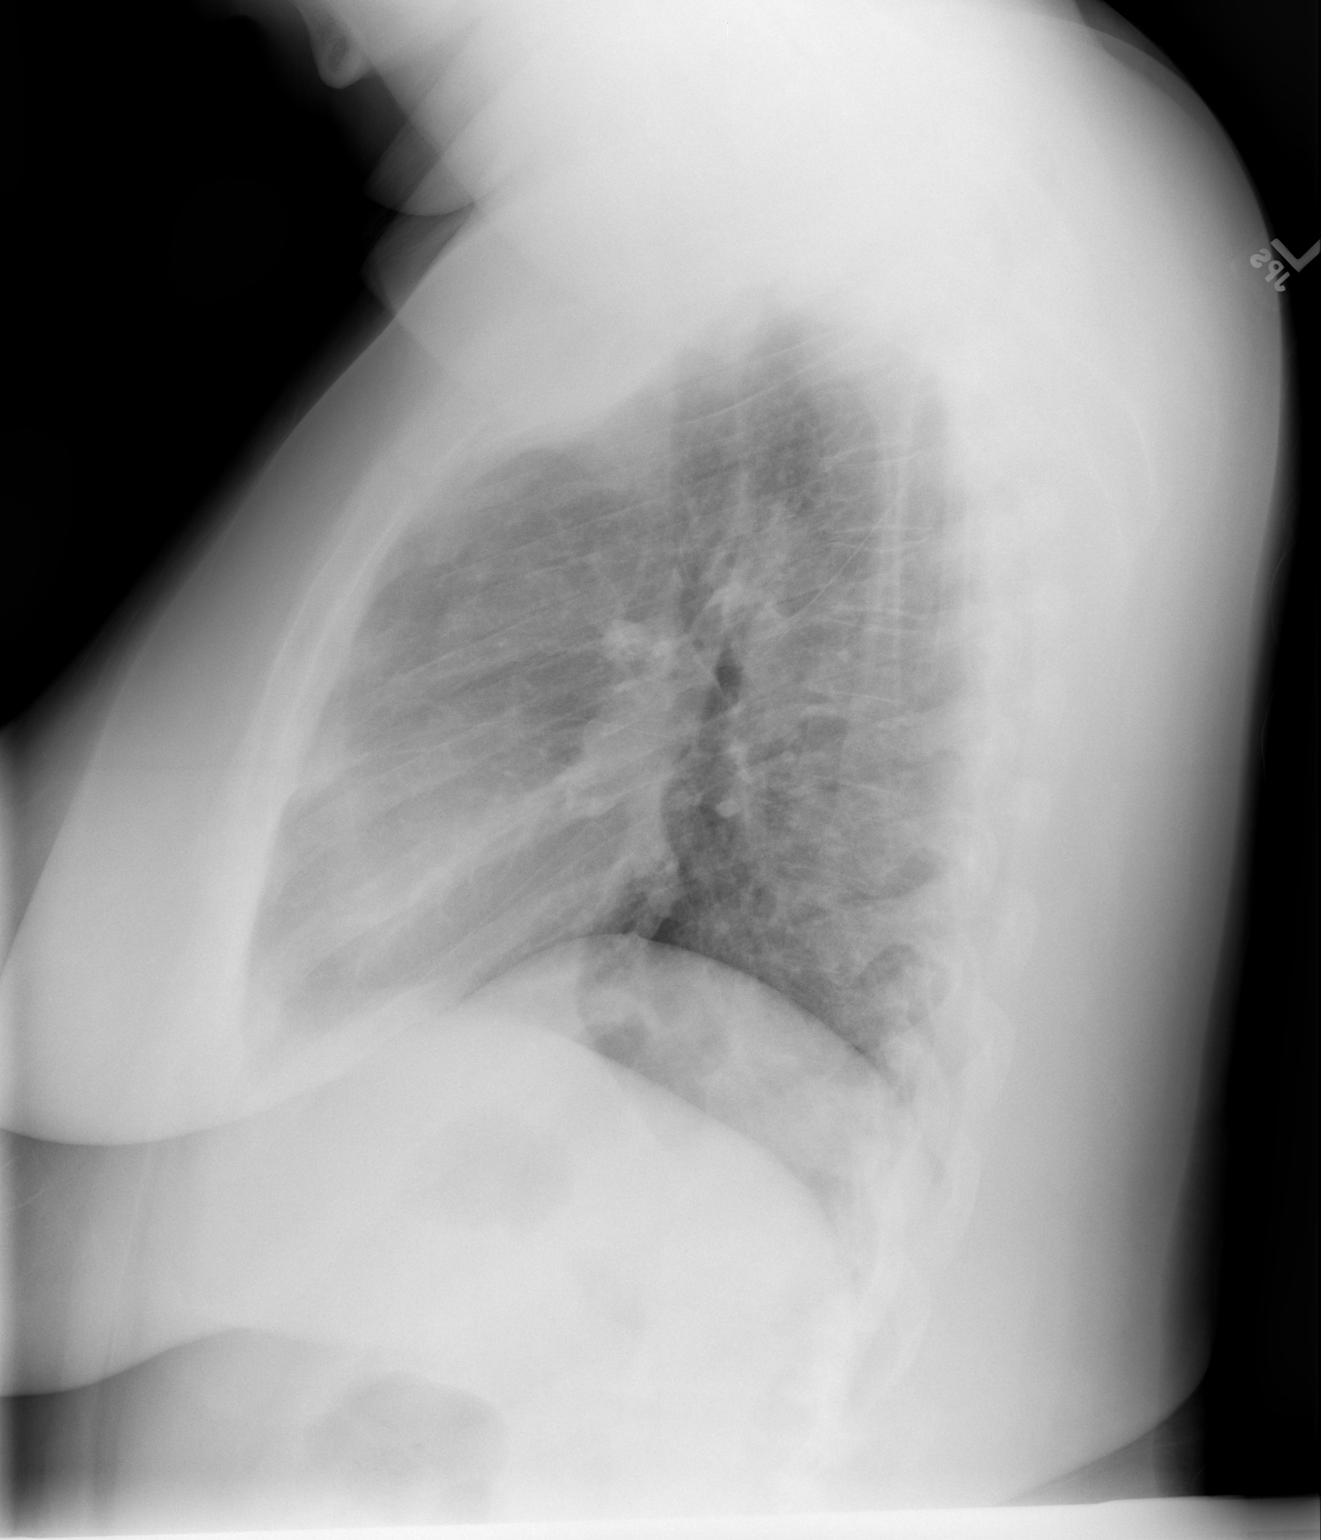

[2 of 2 positions shown; findings below may reference images not displayed]

FINDINGS: Normal heart size, mediastinal contours, and pulmonary vascularity.
Lungs clear.
Bones unremarkable.
No pneumothorax.
IMPRESSION: No acute abnormalities.

## 2014-05-15 ENCOUNTER — Emergency Department (HOSPITAL_COMMUNITY)
Admission: EM | Admit: 2014-05-15 | Discharge: 2014-05-16 | Disposition: A | Discharge status: Home / Self Care | Payer: Self-pay | Attending: Emergency Medicine | Admitting: Emergency Medicine

## 2014-05-15 ENCOUNTER — Encounter (HOSPITAL_COMMUNITY): Payer: Self-pay | Admitting: Emergency Medicine

## 2014-05-15 ENCOUNTER — Emergency Department (HOSPITAL_COMMUNITY): Payer: Medicaid Other

## 2014-05-15 DIAGNOSIS — I252 Old myocardial infarction: Secondary | ICD-10-CM | POA: Insufficient documentation

## 2014-05-15 DIAGNOSIS — Z87891 Personal history of nicotine dependence: Secondary | ICD-10-CM | POA: Insufficient documentation

## 2014-05-15 DIAGNOSIS — H00036 Abscess of eyelid left eye, unspecified eyelid: Secondary | ICD-10-CM

## 2014-05-15 DIAGNOSIS — H5712 Ocular pain, left eye: Secondary | ICD-10-CM

## 2014-05-15 DIAGNOSIS — R509 Fever, unspecified: Secondary | ICD-10-CM | POA: Insufficient documentation

## 2014-05-15 DIAGNOSIS — Z862 Personal history of diseases of the blood and blood-forming organs and certain disorders involving the immune mechanism: Secondary | ICD-10-CM | POA: Insufficient documentation

## 2014-05-15 DIAGNOSIS — Z791 Long term (current) use of non-steroidal anti-inflammatories (NSAID): Secondary | ICD-10-CM | POA: Insufficient documentation

## 2014-05-15 DIAGNOSIS — J3489 Other specified disorders of nose and nasal sinuses: Secondary | ICD-10-CM | POA: Insufficient documentation

## 2014-05-15 DIAGNOSIS — Z880 Allergy status to penicillin: Secondary | ICD-10-CM | POA: Insufficient documentation

## 2014-05-15 DIAGNOSIS — H05012 Cellulitis of left orbit: Secondary | ICD-10-CM | POA: Insufficient documentation

## 2014-05-15 LAB — I-STAT CHEM 8, ED
BUN: 14 mg/dL (ref 6–23)
CREATININE: 0.9 mg/dL (ref 0.50–1.10)
Calcium, Ion: 1.17 mmol/L (ref 1.12–1.23)
Chloride: 106 mmol/L (ref 96–112)
GLUCOSE: 93 mg/dL (ref 70–99)
HEMATOCRIT: 36 % (ref 36.0–46.0)
HEMOGLOBIN: 12.2 g/dL (ref 12.0–15.0)
Potassium: 3.2 mmol/L — ABNORMAL LOW (ref 3.5–5.1)
SODIUM: 143 mmol/L (ref 135–145)
TCO2: 19 mmol/L (ref 0–100)

## 2014-05-15 LAB — I-STAT BETA HCG BLOOD, ED (MC, WL, AP ONLY)

## 2014-05-15 MED ORDER — IOHEXOL 300 MG/ML  SOLN
100.0000 mL | Freq: Once | INTRAMUSCULAR | Status: AC | PRN
  Administered 2014-05-15: 75 mL via INTRAVENOUS

## 2014-05-15 MED ORDER — OXYCODONE-ACETAMINOPHEN 5-325 MG PO TABS
1.0000 | ORAL_TABLET | Freq: Once | ORAL | Status: AC
  Administered 2014-05-15: 1 via ORAL
  Filled 2014-05-15: qty 1

## 2014-05-15 NOTE — ED Notes (Signed)
Patient with left sided facial pain.  Swelling noted.  Patient states that she has "bad sinuses" and thought that maybe she had a dental infection.

## 2014-05-15 NOTE — ED Provider Notes (Signed)
CSN: [id number]     Arrival date & time 05/15/14  2017 History   First MD Initiated Contact with Patient 05/15/14 2030     Chief Complaint  Patient presents with  . Facial Pain     (Consider location/radiation/quality/duration/timing/severity/associated sxs/prior Treatment) The history is provided by the patient. No language interpreter was used.  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  She has had a fever and rhinorrhea  for the past 2 days.  She denies any injury, throat pain, ear pain, or nasal drainage. She denies any vision changes or painful occular movement. Past Medical History  Diagnosis Date  . Myocardial infarct   . Anemia   . Bronchitis    Past Surgical History  Procedure Laterality Date  . Eye surgery    . Abdominal hysterectomy    . Tubal ligation    . Eye surgery  10/16/10   No family history on file. History  Substance Use Topics  . Smoking status: Former Smoker -- 0.50 packs/day    Types: Cigarettes    Quit date: 03/15/2012  . Smokeless tobacco: Not on file  . Alcohol Use: Yes     Comment: occational   OB History    No data available     Review of Systems  Constitutional: Positive for fever. Negative for chills.  HENT: Positive for facial swelling, rhinorrhea and sinus pressure. Negative for dental problem, ear discharge, ear pain, sore throat and trouble swallowing.   Eyes: Negative for photophobia, pain, discharge, redness and visual disturbance.  Gastrointestinal: Negative for nausea and vomiting.  Skin: Negative for rash.  All other systems reviewed and are negative.     Allergies  Medroxyprogesterone acetate; Valproic acid; Pork-derived products; and Penicillins  Home Medications   Prior to Admission medications   Medication Sig Start Date End Date Taking? Authorizing Provider  cyclobenzaprine (FLEXERIL) 10 MG  tablet Take 10 mg by mouth 3 (three) times daily as needed for muscle spasms.   Yes Historical Provider, MD  diphenhydrAMINE (BENADRYL) 25 MG tablet Take 25 mg by mouth every 6 (six) hours as needed for allergies or sleep.   Yes Historical Provider, MD  docusate sodium (STOOL SOFTENER) 100 MG capsule Take 200 mg by mouth at bedtime. 08/24/13  Yes Historical Provider, MD  ibuprofen (ADVIL,MOTRIN) 200 MG tablet Take 200 mg by mouth 3 (three) times daily.    Yes Historical Provider, MD  levalbuterol (XOPENEX HFA) 45 MCG/ACT inhaler Inhale 2-4 puffs into the lungs every 6 (six) hours as needed for wheezing or shortness of breath.    Yes Historical Provider, MD  polyethylene glycol powder (MIRALAX) powder Take 17 g by mouth every morning. 08/24/13  Yes Historical Provider, MD  Propylene Glycol (SYSTANE BALANCE OP) Place 2 drops into the left eye as needed (for irritation).   Yes Historical Provider, MD  traMADol (ULTRAM) 50 MG tablet Take 100 mg by mouth every 6 (six) hours as needed for moderate pain.   Yes Historical Provider, MD  clindamycin (CLEOCIN) 300 MG capsule Take 1 capsule (300 mg total) by mouth 3 (three) times daily. 05/16/14   Ottie Glazier, PA-C  HYDROcodone-acetaminophen (NORCO/VICODIN) 5-325 MG per tablet Take 1-2 tablets every 6 hours as needed for severe pain Patient not taking: Reported on 05/15/2014 10/11/13   Carlisle Cater, PA-C  methocarbamol (ROBAXIN) 500 MG tablet Take 2 tablets (  1,000 mg total) by mouth 4 (four) times daily. Patient not taking: Reported on 05/15/2014 10/11/13   Carlisle Cater, PA-C  naproxen (NAPROSYN) 500 MG tablet Take 1 tablet (500 mg total) by mouth 2 (two) times daily. Patient not taking: Reported on 05/15/2014 10/11/13   Carlisle Cater, PA-C   BP 132/98 mmHg  Pulse 73  Temp(Src) 97.7 F (36.5 C) (Oral)  Resp 16  SpO2 97% Physical Exam  Constitutional: She is oriented to person, place, and time. Vital signs are normal. She appears well-developed and  well-nourished.  HENT:  Head: Normocephalic and atraumatic.    Tenderness to palpation of the left upper eyelid and the left maxillary tissue.  No erythema.   Extraocular movement is preserved and without pain.   Eyes: Conjunctivae and EOM are normal. Pupils are equal, round, and reactive to light.  Neck: Normal range of motion. Neck supple.  Cardiovascular: Normal rate, regular rhythm and normal heart sounds.   Pulmonary/Chest: Effort normal and breath sounds normal.  Abdominal: Soft. There is no tenderness.  Musculoskeletal: Normal range of motion.  Lymphadenopathy:  No anterior cervical lymphadenopathy.   Neurological: She is alert and oriented to person, place, and time.  Skin: Skin is warm and dry.  Nursing note and vitals reviewed.   ED Course  Procedures (including critical care time) Labs Review Labs Reviewed  I-STAT CHEM 8, ED - Abnormal; Notable for the following:    Potassium 3.2 (*)    All other components within normal limits  I-STAT BETA HCG BLOOD, ED ([facility], [facility], [facility] ONLY)    Imaging Review Ct Orbits W/cm  05/16/2014   CLINICAL DATA:  Acute onset of left eye pain and swelling for 1 week, recently worsening. Initial encounter.  EXAM: CT ORBITS WITH CONTRAST  TECHNIQUE: Multidetector CT imaging of the orbits was performed following the bolus administration of intravenous contrast.  CONTRAST:  72mL OMNIPAQUE IOHEXOL 300 MG/ML  SOLN  COMPARISON:  CT of the head performed 04/15/2013  FINDINGS: There is mildly asymmetric prominence of the soft tissues anterior to the left orbit, raising suspicion for mild preseptal cellulitis. There is no evidence of intraorbital involvement. The orbits are otherwise symmetric. The extraocular musculature is within normal limits. The optic globes are unremarkable in appearance.  The visualized portions of the brain are unremarkable in appearance. Postoperative change is seen at the left orbital floor, with mild underlying cortical irregularity.   IMPRESSION: Mildly asymmetric prominence of the soft tissues anterior to the left orbit, concerning for mild preseptal cellulitis given the patient's symptoms. No evidence of intraorbital involvement. The orbits are otherwise symmetric and unremarkable in appearance.   Electronically Signed   By: Garald Balding M.D.   On: 05/16/2014 01:07     EKG Interpretation None      MDM   Final diagnoses:  Preseptal cellulitis, left   CT orbits with contrast shows mild preseptal cellulitis.  No intraorbital involvement. Vitals normal. Patient given clindamycin to cover MRSA and f/u in 2 days.  Explained to the patient that potassium is low and to f/u with PCP. Discussed return precautions such as increased swelling and inability to move eyes without pain. Tylenol or motrin for fever. Warm compresses several times a day.     Ottie Glazier, PA-C 05/16/14 1516  Pamella Pert, MD 05/17/14 1011

## 2014-05-16 MED ORDER — CLINDAMYCIN HCL 300 MG PO CAPS: 300.0000 mg | ORAL_CAPSULE | Freq: Three times a day (TID) | ORAL | Status: DC

## 2014-05-16 MED ORDER — CLINDAMYCIN HCL 150 MG PO CAPS
600.0000 mg | ORAL_CAPSULE | Freq: Once | ORAL | Status: AC
  Administered 2014-05-16: 600 mg via ORAL
  Filled 2014-05-16: qty 4

## 2014-05-16 NOTE — Discharge Instructions (Signed)
Periorbital Cellulitis Periorbital cellulitis is a common infection that can affect the eyelid and the soft tissues that surround the eyeball. The infection may also affect the structures that produce and drain tears. It does not affect the eyeball itself. Natural tissue barriers usually prevent the spread of this infection to the eyeball and other deeper areas of the eye socket.  CAUSES  Bacterial infection.  Long-term (chronic) sinus infections.  An object (foreign body) stuck behind the eye.  An injury that goes through the eyelid tissues.  An injury that causes an infection, such as an insect sting.  Fracture of the bone around the eye.  Infections which have spread from the eyelid or other structures around the eye.  Bite wounds.  Inflammation or infection of the lining membranes of the brain (meningitis).  An infection in the blood (septicemia).  Dental infection (abscess).  Viral infection (this is rare). SYMPTOMS Symptoms usually come on suddenly.  Pain in the eye.  Red, hot, and swollen eyelids and possibly cheeks. The swelling is sometimes bad enough that the eyelids cannot open. Some infections make the eyelids look purple.  Fever and feeling generally ill.  Pain when touching the area around the eye. DIAGNOSIS  Periorbital cellulitis can be diagnosed from an eye exam. In severe cases, your caregiver might suggest:  Blood tests.  Imaging tests (such as a CT scan) to examine the sinuses and the area around and behind the eyeball. TREATMENT If your caregiver feels that you do not have any signs of serious infection, treatment may include:  Antibiotics.  Nasal decongestants to reduce swelling.  Referral to a dentist if it is suspected that the infection was caused by a prior tooth infection.  Examination every day to make sure the problem is improving. HOME CARE INSTRUCTIONS  Take your antibiotics as directed. Finish them even if you start to feel  better.  Some pain is normal with this condition. Take pain medicine as directed by your caregiver. Only take pain medicines approved by your caregiver.  It is important to drink fluids. Drink enough water and fluids to keep your urine clear or pale yellow.  Do not smoke.  Rest and get plenty of sleep.  Mild or moderate fevers generally have no long-term effects and often do not require treatment.  If your caregiver has given you a follow-up appointment, it is very important to keep that appointment. Your caregiver will need to make sure that the infection is getting better. It is important to check that a more serious infection is not developing. SEEK IMMEDIATE MEDICAL CARE IF:  Your eyelids become more painful, red, warm, or swollen.  You develop double vision or your vision becomes blurred or worsens in any way.  You have trouble moving your eyes.  The eye looks like it is popping out (proptosis).  You develop a severe headache, severe neck pain, or neck stiffness.  You develop repeated vomiting.  You have a fever or persistent symptoms for more than 72 hours.  You have a fever and your symptoms suddenly get worse. MAKE SURE YOU:  Understand these instructions.  Will watch your condition.  Will get help right away if you are not doing well or get worse. Document Released: 04/04/2010 Document Revised: 05/25/2011 Document Reviewed: 04/04/2010 [facility] Patient Information 2015 [place], [place]. This information is not intended to replace advice given to you by your health care provider. Make sure you discuss any questions you have with your health care provider.  Emergency Department Resource Guide 1) Find a Doctor and Pay Out of Pocket Although you won't have to find out who is covered by your insurance plan, it is a good idea to ask around and get recommendations. You will then need to call the office and see if the doctor you have chosen will accept you as a new patient  and what types of options they offer for patients who are self-pay. Some doctors offer discounts or will set up payment plans for their patients who do not have insurance, but you will need to ask so you aren't surprised when you get to your appointment.  2) Contact Your Local Health Department Not all health departments have doctors that can see patients for sick visits, but many do, so it is worth a call to see if yours does. If you don't know where your local health department is, you can check in your phone book. The CDC also has a tool to help you locate your state's health department, and many state websites also have listings of all of their local health departments.  3) Find a Saline Clinic If your illness is not likely to be very severe or complicated, you may want to try a walk in clinic. These are popping up all over the country in pharmacies, drugstores, and shopping centers. They're usually staffed by nurse practitioners or physician assistants that have been trained to treat common illnesses and complaints. They're usually fairly quick and inexpensive. However, if you have serious medical issues or chronic medical problems, these are probably not your best option.  No Primary Care Doctor: - Call Health Connect at  [phone] - they can help you locate a primary care doctor that  accepts your insurance, provides certain services, etc. - Physician Referral Service- [phone]  Chronic Pain Problems: Organization         Address  Phone   Notes  [facility]  [phone] Patients need to be referred by their primary care doctor.   Medication Assistance: Organization         Address  Phone   Notes  [facility] [address] [place], [place] [address] [phone] --Must be a resident of [facility] -- Must have NO insurance coverage whatsoever (no Medicaid/ Medicare, etc.) -- The pt. MUST have a primary care  doctor that directs their care regularly and follows them in the community   [facility]ist  [phone]   [employer]  [phone]    Agencies that provide inexpensive medical care: Organization         Address  Phone   Notes  [place]  [phone]   [facility]    [phone]   [facility] [place], [place] [address] [phone]   [place] [address] [place] [phone]   Planned Parenthood    [phone]   [facility]    [phone]   [place] and [place] [address], [place] Phone:  [phone], Fax:  [phone] Hours of Operation:  9 am - 6 pm, M-F.  Also accepts Medicaid/Medicare and self-pay.  [facility] for [place] [address], [address], [place] Phone: [phone], Fax: [phone]. Hours of Operation:  8:30 am - 5:30 pm, M-F.  Also accepts Medicaid and self-pay.  [facility]  [address], [employer] Phone: [phone]   [place], [place], [place] [phone], Ext. [phone] Mondays & Thursdays: 7-9 AM.  First 15 patients are seen on a first come, first serve basis.    [place] Providers:  Organization         Address  Phone   Notes  [facility] [address], [place] [phone] Also accepts self-pay patients.  [facility] [address], [place]  [phone]   [place], [address], [place] [phone]   [facility] [address], [place] [phone]   Lucianne Lei [address], [place]   [phone] Only accepts [place] Access [place] patients after they have their name applied to their card.   Self-Pay (no insurance) in [facility]:  Organization         Address  Phone   Notes  Sickle Cell Patients, [facility] [place] [phone]   [facility] [place] [phone]   [facility] [place]  [place], [address], [place] [phone]   [facility]/Dr. Osei-Bonsu  [address], [place] or [address], [place] [phone] Phone number for both [place] and [place] locations is the same.  [facility] and [facility] [address] [place] [phone]   [facility] [address], [place] or [address] [phone]   [facility] [address] [place] [phone], phone; [phone], fax Sees patients 1st and 3rd Saturday of every month.  Must not qualify for public or private insurance (i.e. Medicaid, Medicare, [place] Health Choice, Veterans' Benefits)  Household income should be no more than 200% of the poverty level The clinic cannot treat you if you are pregnant or think you are pregnant  Sexually transmitted diseases are not treated at the clinic.    Dental Care: Organization         Address  Phone  Notes  [facility] Department of [facility] [place] [phone] Accepts children up to age 69 who are enrolled in [place] or [place]; pregnant women with a Medicaid card; and children who have applied for Medicaid or [place] Health Choice, but were declined, whose parents can pay a reduced fee at time of service.  [facility] Department of [facility]  [address], [place] [phone] Accepts children up to age 22 who are enrolled in [place] or [place]; pregnant women with a Medicaid card; and children who have applied for Medicaid or [place] Health Choice, but were declined, whose parents can  pay a reduced fee at time of service.  [facility]  [place] [phone] Patients are seen by appointment only. Walk-ins are not accepted. [place] will see patients 51 years of age and older. Monday - Tuesday (8am-5pm) Most Wednesdays (8:30-5pm) $30 per visit, cash only  [facility] Adult Dental Access PROGRAM  [address], [facility] [phone] Patients are seen by appointment only. Walk-ins are not accepted. [place] will see patients 86 years of age and older.  One Wednesday Evening (Monthly: Volunteer Based).  $30 per visit, cash only  [place]  [phone] for adults; Children under age 26, call Graduate Pediatric Dentistry at [phone]. Children aged 67-14, please call [phone] to request a pediatric application.  Dental services are provided in all areas of dental care including fillings, crowns and bridges, complete and partial dentures, implants, gum treatment, root canals, and extractions. Preventive care is also provided. Treatment is provided to both adults and children. Patients are selected via a lottery and there is often a waiting list.   [facility] [address], [place]  [phone] [email or web address]   [facility] [address] [place], [place] [phone], Ext. [phone] Second and Fourth Thursday of each month, opens at 6:30 AM; Clinic ends at 9 AM.  Patients are seen on a first-come first-served basis, and a limited number are seen during each clinic.   [facility]  [address] Hillard Danker [place], [place] [phone]   Eligibility Requirements You must have lived in [place], [place], or [place] counties for at least the last three months.   You cannot be eligible for state or federal sponsored [employer], including [employer], [place], or [employer].   You generally cannot be eligible for healthcare  insurance through your employer.    How to apply: Eligibility screenings are held every Tuesday and Wednesday afternoon from 1:00 pm until 4:00 pm. You do not need an appointment for the interview!  [facility] [address] [place], [place]   [place]  [place] Department  [place]  [phone]    Behavioral Health Resources in the Community: Intensive Outpatient Programs Organization         Address  Phone  Notes  [place]. [address] [place], [place] [phone]   [facility] Outpatient [address], [place], [place]   ADS: Alcohol & Drug Svcs [address] [place], [place]   [place] [address]  [place], [place] or [phone]   Substance Abuse Resources Organization         Address  Phone  Notes  Alcohol and Drug Services  [phone]   [place]  [phone]   [facility] [place]   Chinita Pester  [phone]   Residential & Outpatient Substance Abuse Program  [phone]   Psychological Services Organization         Address  Phone  Notes  [facility] [place]  [place]  [phone]   [place] [address] [place] or [phone]    Mobile Crisis Teams Organization         Address  Phone  Notes  [facility], Mobile Crisis Care Unit  [phone]   [facility] Psychotherapeutic Services  [address] [place], [place]   [place] [address], [place] [phone]    Self-Help/Support Groups Organization         Address  Phone             Notes  [place]. of [place] - variety of support groups  [place] Call for more information  Narcotics Anonymous (NA),  [facility] [address], [employer] [place]  2 meetings at this  location   Residential Treatment Programs Organization         Address  Phone  Notes  [facility] [address]    [place]  [phone]   [facility]  [address], [place] [id number], [place], [place]   [place], [place] [phone] Admissions: 8am-3pm M-F  Incentives Substance [place] [address],    [place], [place] [phone]   [facility] [address] [place], [place], [place]   [facility] [address],  [place], [place]   [facility] Programs - Intensive Outpatient [address] Kristeen [address], [place], [place]   [facility] [place].,  [place], [place] [phone] or [phone]   [facility]idential Treatment Services (RTS) [address], [place], [place] Accepts Medicaid  [facility] [address],  [place] [phone] Substance Abuse/Addiction Treatment   [facility] Organization         Address  Phone  Notes  [facility]  [phone]   Domenic Schwab, PhD [address] Arlis [place], [place]   [phone] or [phone]   [place], [place] [phone]   [facility] [address] [place], [place] [phone] Insurance/Medicaid/sponsorship through [facility] and Families [address], [address]                                    [place], [place] [phone] [place] [address], [place] [phone]    Dr. Adele Schilder  [phone]   [facility] Dept. 1) [address] [place] 2) [place] 3)  [place] [address], [place] [phone]  [phone]    [place] [phone] or [phone] (After Hours)

## 2014-05-19 ENCOUNTER — Emergency Department (HOSPITAL_COMMUNITY)
Admission: EM | Admit: 2014-05-19 | Discharge: 2014-05-19 | Disposition: A | Discharge status: Home / Self Care | Payer: Medicaid Other | Attending: Emergency Medicine | Admitting: Emergency Medicine

## 2014-05-19 ENCOUNTER — Encounter (HOSPITAL_COMMUNITY): Payer: Self-pay | Admitting: Physical Medicine and Rehabilitation

## 2014-05-19 DIAGNOSIS — Z880 Allergy status to penicillin: Secondary | ICD-10-CM | POA: Insufficient documentation

## 2014-05-19 DIAGNOSIS — Z87891 Personal history of nicotine dependence: Secondary | ICD-10-CM | POA: Insufficient documentation

## 2014-05-19 DIAGNOSIS — Z862 Personal history of diseases of the blood and blood-forming organs and certain disorders involving the immune mechanism: Secondary | ICD-10-CM | POA: Insufficient documentation

## 2014-05-19 DIAGNOSIS — I252 Old myocardial infarction: Secondary | ICD-10-CM | POA: Insufficient documentation

## 2014-05-19 DIAGNOSIS — Z79899 Other long term (current) drug therapy: Secondary | ICD-10-CM | POA: Insufficient documentation

## 2014-05-19 DIAGNOSIS — Z8709 Personal history of other diseases of the respiratory system: Secondary | ICD-10-CM | POA: Insufficient documentation

## 2014-05-19 DIAGNOSIS — Z791 Long term (current) use of non-steroidal anti-inflammatories (NSAID): Secondary | ICD-10-CM | POA: Insufficient documentation

## 2014-05-19 DIAGNOSIS — E669 Obesity, unspecified: Secondary | ICD-10-CM | POA: Insufficient documentation

## 2014-05-19 DIAGNOSIS — H00036 Abscess of eyelid left eye, unspecified eyelid: Secondary | ICD-10-CM | POA: Insufficient documentation

## 2014-05-19 DIAGNOSIS — Z792 Long term (current) use of antibiotics: Secondary | ICD-10-CM | POA: Insufficient documentation

## 2014-05-19 DIAGNOSIS — L03211 Cellulitis of face: Secondary | ICD-10-CM

## 2014-05-19 MED ORDER — HYDROCODONE-ACETAMINOPHEN 5-325 MG PO TABS: 2.0000 | ORAL_TABLET | ORAL | Status: DC | PRN

## 2014-05-19 MED ORDER — OXYCODONE-ACETAMINOPHEN 5-325 MG PO TABS
2.0000 | ORAL_TABLET | Freq: Once | ORAL | Status: AC
  Administered 2014-05-19: 2 via ORAL
  Filled 2014-05-19: qty 2

## 2014-05-19 NOTE — Discharge Instructions (Signed)
Cellulitis Cellulitis is an infection of the skin and the tissue beneath it. The infected area is usually red and tender. Cellulitis occurs most often in the arms and lower legs.  CAUSES  Cellulitis is caused by bacteria that enter the skin through cracks or cuts in the skin. The most common types of bacteria that cause cellulitis are staphylococci and streptococci. SIGNS AND SYMPTOMS   Redness and warmth.  Swelling.  Tenderness or pain.  Fever. DIAGNOSIS  Your health care provider can usually determine what is wrong based on a physical exam. Blood tests may also be done. TREATMENT  Treatment usually involves taking an antibiotic medicine. HOME CARE INSTRUCTIONS   Take your antibiotic medicine as directed by your health care provider. Finish the antibiotic even if you start to feel better.  Keep the infected arm or leg elevated to reduce swelling.  Apply a warm cloth to the affected area up to 4 times per day to relieve pain.  Take medicines only as directed by your health care provider.  Keep all follow-up visits as directed by your health care provider. SEEK MEDICAL CARE IF:   You notice red streaks coming from the infected area.  Your red area gets larger or turns dark in color.  Your bone or joint underneath the infected area becomes painful after the skin has healed.  Your infection returns in the same area or another area.  You notice a swollen bump in the infected area.  You develop new symptoms.  You have a fever. SEEK IMMEDIATE MEDICAL CARE IF:   You feel very sleepy.  You develop vomiting or diarrhea.  You have a general ill feeling (malaise) with muscle aches and pains. MAKE SURE YOU:   Understand these instructions.  Will watch your condition.  Will get help right away if you are not doing well or get worse. Document Released: 12/10/2004 Document Revised: 07/17/2013 Document Reviewed: 05/18/2011 [facility] Patient Information 2015 [place], [place].  This information is not intended to replace advice given to you by your health care provider. Make sure you discuss any questions you have with your health care provider.   Please return to ED or other care facility within 2 days if your symptoms do not improve. Continue taking her antibiotics as prescribed. Take your pain medicine for severe pain. Return to ED for new or worsening symptoms.

## 2014-05-19 NOTE — ED Notes (Signed)
Pt presents to department for evaluation of L sided facial swelling. Was seen recently for same and diagnosed with facial abscess. States previous L eye injury with hardware placement. Now states L sided facial pain and swelling. Denies visual issues.

## 2014-05-19 NOTE — ED Provider Notes (Signed)
CSN: [id number]     Arrival date & time 05/19/14  1407 History   First MD Initiated Contact with Patient 05/19/14 1616     Chief Complaint  Patient presents with  . Facial Pain  . Abscess     (Consider location/radiation/quality/duration/timing/severity/associated sxs/prior Treatment) HPI [name] is a 44 y.o. female who comes in for evaluation of left-sided facial swelling. Patient was here 2 days ago and diagnosed with preseptal cellulitis in her left eye. She was started on clindamycin and reports she has filled his prescription and has been compliant with his medication. She reports today due to increased pain and discomfort associated with her infection. Reports pain earlier today was a 10/10, she took 6 ibuprofen PM 200 mg with some relief, reducing discomfort to 8/10. Denies fevers at home, painful eye movements, changes in vision, eye pain, photophobia, changes in hearing, headache, jaw pain, difficulties opening her mouth.  Past Medical History  Diagnosis Date  . Myocardial infarct   . Anemia   . Bronchitis    Past Surgical History  Procedure Laterality Date  . Eye surgery    . Abdominal hysterectomy    . Tubal ligation    . Eye surgery  10/16/10   No family history on file. History  Substance Use Topics  . Smoking status: Former Smoker -- 0.50 packs/day    Types: Cigarettes    Quit date: 03/15/2012  . Smokeless tobacco: Not on file  . Alcohol Use: Yes   OB History    No data available     Review of Systems  A 10 point review of systems was completed and was negative except for pertinent positives and negatives as mentioned in the history of present illness    Allergies  Medroxyprogesterone acetate; Valproic acid; Pork-derived products; and Penicillins  Home Medications   Prior to Admission medications   Medication Sig Start Date End Date Taking? Authorizing Provider  ciprofloxacin (CIPRO) 500 MG tablet Take 500 mg by mouth 2 (two) times daily. 7 day  regimen, started 3/2 05/16/14 05/23/14 Yes Historical Provider, MD  cyclobenzaprine (FLEXERIL) 10 MG tablet Take 10 mg by mouth 3 (three) times daily as needed for muscle spasms.   Yes Historical Provider, MD  diphenhydrAMINE (BENADRYL) 25 MG tablet Take 25 mg by mouth every 6 (six) hours as needed for allergies or sleep.   Yes Historical Provider, MD  docusate sodium (STOOL SOFTENER) 100 MG capsule Take 200 mg by mouth at bedtime. 08/24/13  Yes Historical Provider, MD  ibuprofen (ADVIL,MOTRIN) 200 MG tablet Take 200 mg by mouth 3 (three) times daily.    Yes Historical Provider, MD  Propylene Glycol (SYSTANE BALANCE OP) Place 2 drops into the left eye as needed (for irritation).   Yes Historical Provider, MD  clindamycin (CLEOCIN) 300 MG capsule Take 1 capsule (300 mg total) by mouth 3 (three) times daily. Patient not taking: Reported on 05/19/2014 05/16/14   Ottie Glazier, PA-C  HYDROcodone-acetaminophen (NORCO/VICODIN) 5-325 MG per tablet Take 2 tablets by mouth every 4 (four) hours as needed. 05/19/14   Viona Gilmore Paisely Brick, PA-C  methocarbamol (ROBAXIN) 500 MG tablet Take 2 tablets (1,000 mg total) by mouth 4 (four) times daily. Patient not taking: Reported on 05/15/2014 10/11/13   Carlisle Cater, PA-C  naproxen (NAPROSYN) 500 MG tablet Take 1 tablet (500 mg total) by mouth 2 (two) times daily. Patient not taking: Reported on 05/15/2014 10/11/13   Carlisle Cater, PA-C  traMADol (ULTRAM) 50 MG tablet Take 100 mg  by mouth every 6 (six) hours as needed for moderate pain.    Historical Provider, MD   BP 150/97 mmHg  Pulse 79  Temp(Src) 97.7 F (36.5 C) (Oral)  Resp 18  Ht 6' (1.829 m)  Wt 291 lb (131.997 kg)  BMI 39.46 kg/m2  SpO2 98% Physical Exam  Constitutional: She is oriented to person, place, and time. She appears well-developed and well-nourished.  Obese  HENT:  Head: Normocephalic and atraumatic.    Mouth/Throat: Oropharynx is clear and moist.  Tenderness diffusely to the inferior periorbital  soft tissues of left eyelid. No eyelid involvement. Mild erythema. No fluctuance. Conjunctiva are normal, no discharge, extraocular movements intact without discomfort. No visual field deficits. No other lesions or deformities noted  Eyes: Conjunctivae and EOM are normal. Pupils are equal, round, and reactive to light. Right eye exhibits no discharge. Left eye exhibits no discharge. No scleral icterus.  Neck: Normal range of motion. Neck supple.  Cardiovascular: Normal rate, regular rhythm and normal heart sounds.   Pulmonary/Chest: Effort normal and breath sounds normal. No respiratory distress. She has no wheezes. She has no rales.  Abdominal: Soft. There is no tenderness.  Musculoskeletal: She exhibits no tenderness.  Neurological: She is alert and oriented to person, place, and time.  Cranial Nerves II-XII grossly intact  Skin: Skin is warm and dry. No rash noted.  Psychiatric: She has a normal mood and affect.  Nursing note and vitals reviewed.   ED Course  Procedures (including critical care time)  ULTRASOUND LIMITED SOFT TISSUE/ MUSCULOSKELETAL: Left eye soft tissue Indication: Cellulitis, pain Linear probe used to evaluate area of interest in two planes. Findings:  Cellulitis, no abscess or fluid collection. Performed by: Jaquita Folds Images saved electronically  Labs Review Labs Reviewed - No data to display  Imaging Review No results found.   EKG Interpretation None     Meds given in ED:  Medications  oxyCODONE-acetaminophen (PERCOCET/ROXICET) 5-325 MG per tablet 2 tablet (2 tablets Oral Given 05/19/14 1630)    New Prescriptions   HYDROCODONE-ACETAMINOPHEN (NORCO/VICODIN) 5-325 MG PER TABLET    Take 2 tablets by mouth every 4 (four) hours as needed.   Filed Vitals:   05/19/14 1426 05/19/14 1600  BP: 144/91 150/97  Pulse: 84 79  Temp: 97.7 F (36.5 C)   TempSrc: Oral   Resp: 18 18  Height: 6' (1.829 m)   Weight: 291 lb (131.997 kg)   SpO2: 98% 98%    MDM    Vitals stable - WNL -afebrile Pt resting comfortably in ED. oral analgesia improved his discomfort. PE--essentially unchanged from previous. Extraocular movements intact without discomfort, no evidence of advancing disease. Imaging--a bedside ultrasound showed no evidence of abscess or drainable fluid collection  DDX--discussed continued therapy with antibiotic regimen, warm compresses and follow up for recheck in 2 days if symptoms are not improving. No evidence of septal cellulitis or other intraocular infection. Will DC with short course pain medicines I discussed all relevant lab findings and imaging results with pt and they verbalized understanding. Discussed f/u with PCP within 48 hrs and return precautions, pt very amenable to plan.  Final diagnoses:  Cellulitis of face        Verl Dicker, PA-C 05/19/14 1656  Ernestina Patches, MD 05/19/14 2105

## 2014-08-22 ENCOUNTER — Emergency Department (HOSPITAL_COMMUNITY)
Admission: EM | Admit: 2014-08-22 | Discharge: 2014-08-23 | Disposition: A | Discharge status: Home / Self Care | Payer: Self-pay | Attending: Emergency Medicine | Admitting: Emergency Medicine

## 2014-08-22 ENCOUNTER — Emergency Department (HOSPITAL_COMMUNITY): Payer: Medicaid Other

## 2014-08-22 ENCOUNTER — Encounter (HOSPITAL_COMMUNITY): Payer: Self-pay | Admitting: *Deleted

## 2014-08-22 DIAGNOSIS — Z87891 Personal history of nicotine dependence: Secondary | ICD-10-CM | POA: Insufficient documentation

## 2014-08-22 DIAGNOSIS — I252 Old myocardial infarction: Secondary | ICD-10-CM | POA: Insufficient documentation

## 2014-08-22 DIAGNOSIS — Z791 Long term (current) use of non-steroidal anti-inflammatories (NSAID): Secondary | ICD-10-CM | POA: Insufficient documentation

## 2014-08-22 DIAGNOSIS — Z79899 Other long term (current) drug therapy: Secondary | ICD-10-CM | POA: Insufficient documentation

## 2014-08-22 DIAGNOSIS — R109 Unspecified abdominal pain: Secondary | ICD-10-CM

## 2014-08-22 DIAGNOSIS — R51 Headache: Secondary | ICD-10-CM | POA: Insufficient documentation

## 2014-08-22 DIAGNOSIS — Z862 Personal history of diseases of the blood and blood-forming organs and certain disorders involving the immune mechanism: Secondary | ICD-10-CM | POA: Insufficient documentation

## 2014-08-22 DIAGNOSIS — Z8709 Personal history of other diseases of the respiratory system: Secondary | ICD-10-CM | POA: Insufficient documentation

## 2014-08-22 DIAGNOSIS — N390 Urinary tract infection, site not specified: Secondary | ICD-10-CM | POA: Insufficient documentation

## 2014-08-22 LAB — COMPREHENSIVE METABOLIC PANEL
ALK PHOS: 61 U/L (ref 38–126)
ALT: 13 U/L — ABNORMAL LOW (ref 14–54)
ANION GAP: 7 (ref 5–15)
AST: 18 U/L (ref 15–41)
Albumin: 3.6 g/dL (ref 3.5–5.0)
BILIRUBIN TOTAL: 0.5 mg/dL (ref 0.3–1.2)
BUN: 13 mg/dL (ref 6–20)
CALCIUM: 8.7 mg/dL — AB (ref 8.9–10.3)
CO2: 25 mmol/L (ref 22–32)
CREATININE: 0.8 mg/dL (ref 0.44–1.00)
Chloride: 106 mmol/L (ref 101–111)
GFR calc non Af Amer: >60 mL/min (ref >60)
GLUCOSE: 94 mg/dL (ref 65–99)
Potassium: 3.3 mmol/L — ABNORMAL LOW (ref 3.5–5.1)
Sodium: 138 mmol/L (ref 135–145)
Total Protein: 7.3 g/dL (ref 6.5–8.1)

## 2014-08-22 LAB — CBC WITH DIFFERENTIAL/PLATELET
Basophils Absolute: 0 10*3/uL (ref 0.0–0.1)
Basophils Relative: 0 % (ref 0–1)
EOS PCT: 6 % — AB (ref 0–5)
Eosinophils Absolute: 0.3 10*3/uL (ref 0.0–0.7)
HCT: 35.5 % — ABNORMAL LOW (ref 36.0–46.0)
Hemoglobin: 11.6 g/dL — ABNORMAL LOW (ref 12.0–15.0)
Lymphocytes Relative: 53 % — ABNORMAL HIGH (ref 12–46)
Lymphs Abs: 2.7 10*3/uL (ref 0.7–4.0)
MCH: 28.3 pg (ref 26.0–34.0)
MCHC: 32.7 g/dL (ref 30.0–36.0)
MCV: 86.6 fL (ref 78.0–100.0)
Monocytes Absolute: 0.3 10*3/uL (ref 0.1–1.0)
Monocytes Relative: 6 % (ref 3–12)
NEUTROS ABS: 1.8 10*3/uL (ref 1.7–7.7)
Neutrophils Relative %: 35 % — ABNORMAL LOW (ref 43–77)
PLATELETS: 250 10*3/uL (ref 150–400)
RBC: 4.1 MIL/uL (ref 3.87–5.11)
RDW: 12.6 % (ref 11.5–15.5)
WBC: 5.1 10*3/uL (ref 4.0–10.5)

## 2014-08-22 LAB — URINALYSIS, ROUTINE W REFLEX MICROSCOPIC
Bilirubin Urine: NEGATIVE
GLUCOSE, UA: NEGATIVE mg/dL
KETONES UR: NEGATIVE mg/dL
LEUKOCYTES UA: NEGATIVE
Nitrite: NEGATIVE
Protein, ur: 30 mg/dL — AB
Specific Gravity, Urine: 1.039 — ABNORMAL HIGH (ref 1.005–1.030)
UROBILINOGEN UA: 0.2 mg/dL (ref 0.0–1.0)
pH: 5.5 (ref 5.0–8.0)

## 2014-08-22 LAB — URINE MICROSCOPIC-ADD ON

## 2014-08-22 LAB — LIPASE, BLOOD: Lipase: 19 U/L — ABNORMAL LOW (ref 22–51)

## 2014-08-22 MED ORDER — FENTANYL CITRATE (PF) 100 MCG/2ML IJ SOLN
100.0000 ug | Freq: Once | INTRAMUSCULAR | Status: AC
  Administered 2014-08-22: 100 ug via INTRAVENOUS
  Filled 2014-08-22: qty 2

## 2014-08-22 MED ORDER — FENTANYL CITRATE (PF) 100 MCG/2ML IJ SOLN
50.0000 ug | Freq: Once | INTRAMUSCULAR | Status: AC
  Administered 2014-08-22: 50 ug via INTRAVENOUS
  Filled 2014-08-22: qty 2

## 2014-08-22 NOTE — ED Notes (Signed)
Patient to CT.

## 2014-08-22 NOTE — ED Provider Notes (Signed)
CSN: [id number]     Arrival date & time 08/22/14  1618 History   First MD Initiated Contact with Patient 08/22/14 1956     Chief Complaint  Patient presents with  . Facial Pain  . Abdominal Pain      HPI Patient has chief complaint of left-sided upper abdominal and left flank pain which began approximately 2 weeks ago.  No fever chills.  No definite dysuria or fever.  Patient also has some left-sided headache that's a chronic issue she's had the past.  Patient did have a bite to her left knee which drained but is now almost cleared up. Past Medical History  Diagnosis Date  . Myocardial infarct   . Anemia   . Bronchitis    Past Surgical History  Procedure Laterality Date  . Eye surgery    . Abdominal hysterectomy    . Tubal ligation    . Eye surgery  10/16/10   No family history on file. History  Substance Use Topics  . Smoking status: Former Smoker -- 0.50 packs/day    Types: Cigarettes    Quit date: 03/15/2012  . Smokeless tobacco: Not on file  . Alcohol Use: Yes   OB History    No data available     Review of Systems  All other systems reviewed and are negative  Allergies  Medroxyprogesterone acetate; Valproic acid; Pork-derived products; and Penicillins  Home Medications   Prior to Admission medications   Medication Sig Start Date End Date Taking? Authorizing Provider  cyclobenzaprine (FLEXERIL) 10 MG tablet Take 10 mg by mouth 3 (three) times daily as needed for muscle spasms.   Yes Historical Provider, MD  diphenhydrAMINE (BENADRYL) 25 MG tablet Take 25 mg by mouth every 6 (six) hours as needed for allergies or sleep.   Yes Historical Provider, MD  docusate sodium (STOOL SOFTENER) 100 MG capsule Take 200 mg by mouth at bedtime. 08/24/13  Yes Historical Provider, MD  ibuprofen (ADVIL,MOTRIN) 200 MG tablet Take 200 mg by mouth 3 (three) times daily.    Yes Historical Provider, MD  oxyCODONE-acetaminophen (PERCOCET/ROXICET) 5-325 MG per tablet Take 2 tablets by mouth  every 4 (four) hours as needed for severe pain. 08/23/14   Leonard Schwartz, MD  sulfamethoxazole-trimethoprim (BACTRIM DS,SEPTRA DS) 800-160 MG per tablet Take 1 tablet by mouth 2 (two) times daily. 08/23/14 08/30/14  Leonard Schwartz, MD   BP 132/81 mmHg  Pulse 62  Temp(Src) 98 F (36.7 C) (Oral)  Resp 18  Ht 6' (1.829 m)  Wt 291 lb (131.997 kg)  BMI 39.46 kg/m2  SpO2 100% Physical Exam Physical Exam  Nursing note and vitals reviewed. Constitutional: She is oriented to person, place, and time. She appears well-developed and well-nourished. No distress.  HENT:  Head: Normocephalic and atraumatic.  Eyes: Pupils are equal, round, and reactive to light.  Neck: Normal range of motion.  Cardiovascular: Normal rate and intact distal pulses.   Pulmonary/Chest: No respiratory distress.  Abdominal: Normal appearance. She exhibits no distension.  Musculoskeletal: Normal range of motion.  Neurological: She is alert and oriented to person, place, and time. No cranial nerve deficit.  Skin: Skin is warm and dry. No rash noted.  Psychiatric: She has a normal mood and affect. Her behavior is normal.   ED Course  Procedures (including critical care time) Medications  fentaNYL (SUBLIMAZE) injection 100 mcg (100 mcg Intravenous Given 08/22/14 2050)  fentaNYL (SUBLIMAZE) injection 50 mcg (50 mcg Intravenous Given 08/22/14 2243)    Labs  Review Labs Reviewed  CBC WITH DIFFERENTIAL/PLATELET - Abnormal; Notable for the following:    Hemoglobin 11.6 (*)    HCT 35.5 (*)    Neutrophils Relative % 35 (*)    Lymphocytes Relative 53 (*)    Eosinophils Relative 6 (*)    All other components within normal limits  COMPREHENSIVE METABOLIC PANEL - Abnormal; Notable for the following:    Potassium 3.3 (*)    Calcium 8.7 (*)    ALT 13 (*)    All other components within normal limits  LIPASE, BLOOD - Abnormal; Notable for the following:    Lipase 19 (*)    All other components within normal limits  URINALYSIS,  ROUTINE W REFLEX MICROSCOPIC (NOT AT [facility]) - Abnormal; Notable for the following:    APPearance TURBID (*)    Specific Gravity, Urine 1.039 (*)    Hgb urine dipstick TRACE (*)    Protein, ur 30 (*)    All other components within normal limits  URINE MICROSCOPIC-ADD ON - Abnormal; Notable for the following:    Squamous Epithelial / LPF MANY (*)    Bacteria, UA MANY (*)    All other components within normal limits  URINE CULTURE  URINALYSIS, ROUTINE W REFLEX MICROSCOPIC (NOT AT [facility])    Imaging Review Ct Renal Stone Study  08/23/2014   CLINICAL DATA:  Left-sided abdominal pain. The patient reports symptoms began after a spider bite 2 weeks ago.  EXAM: CT ABDOMEN AND PELVIS WITHOUT CONTRAST  TECHNIQUE: Multidetector CT imaging of the abdomen and pelvis was performed following the standard protocol without IV contrast.  COMPARISON:  07/09/2008  FINDINGS: There are no urinary calculi. There is no hydronephrosis or ureteral dilatation.  The abdominal aorta is normal in caliber without atherosclerotic calcification.  No acute inflammatory changes are evident in the abdomen or pelvis. There is no ascites. There is no adenopathy.  There are unremarkable unenhanced appearances of the liver, gallbladder, bile ducts, pancreas, spleen, adrenals, and kidneys.  Bowel appears unremarkable.  There is prior hysterectomy.  Ovaries appear unremarkable.  There is no significant abnormality in the lower chest.  There is no significant musculoskeletal abnormality.  IMPRESSION: No acute findings.  No significant abnormality.   Electronically Signed   By: Andreas Newport M.D.   On: 08/23/2014 00:19      MDM   Final diagnoses:  Flank pain  UTI (lower urinary tract infection)        Leonard Schwartz, MD 08/23/14 [phone]

## 2014-08-22 NOTE — ED Notes (Signed)
Pt reports pain to left side of face and left side of abdomen. Reports being bit by spider 2 weeks ago and states symptoms begun afterwards. Headache at times.

## 2014-08-23 MED ORDER — SULFAMETHOXAZOLE-TRIMETHOPRIM 800-160 MG PO TABS: 1.0000 | ORAL_TABLET | Freq: Two times a day (BID) | ORAL | Status: AC

## 2014-08-23 MED ORDER — OXYCODONE-ACETAMINOPHEN 5-325 MG PO TABS: 2.0000 | ORAL_TABLET | ORAL | Status: DC | PRN

## 2014-08-23 NOTE — Discharge Instructions (Signed)

## 2014-08-24 LAB — URINE CULTURE
Colony Count: >=100000
Special Requests: NORMAL

## 2014-09-07 IMAGING — CR DG CHEST 2V
2 series · 2 of 2 positions shown · non-contrast
Comparison: 11/02/2011

CLINICAL DATA: Shortness of breath with dry cough and chest pain

CHEST - 2 VIEW

[w chest pa]
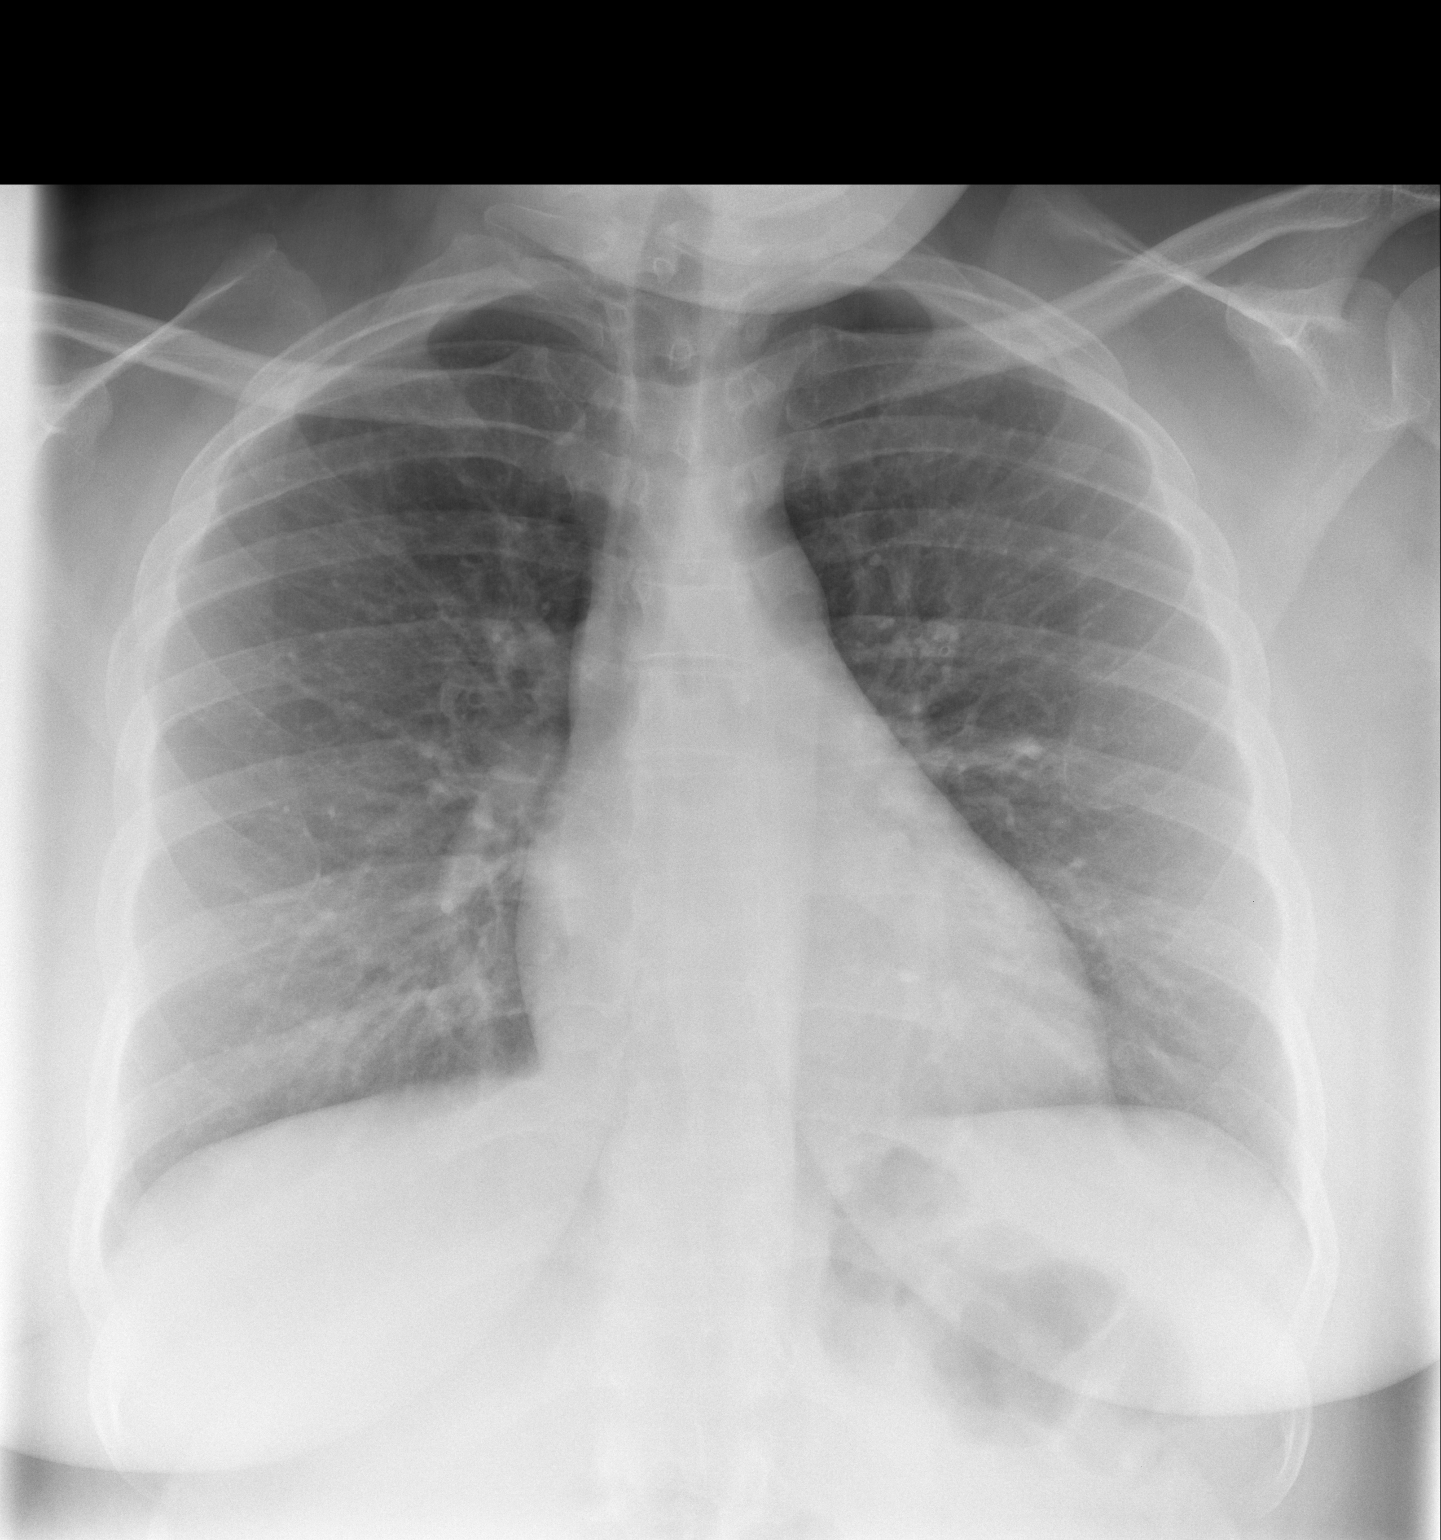

[w chest lat]
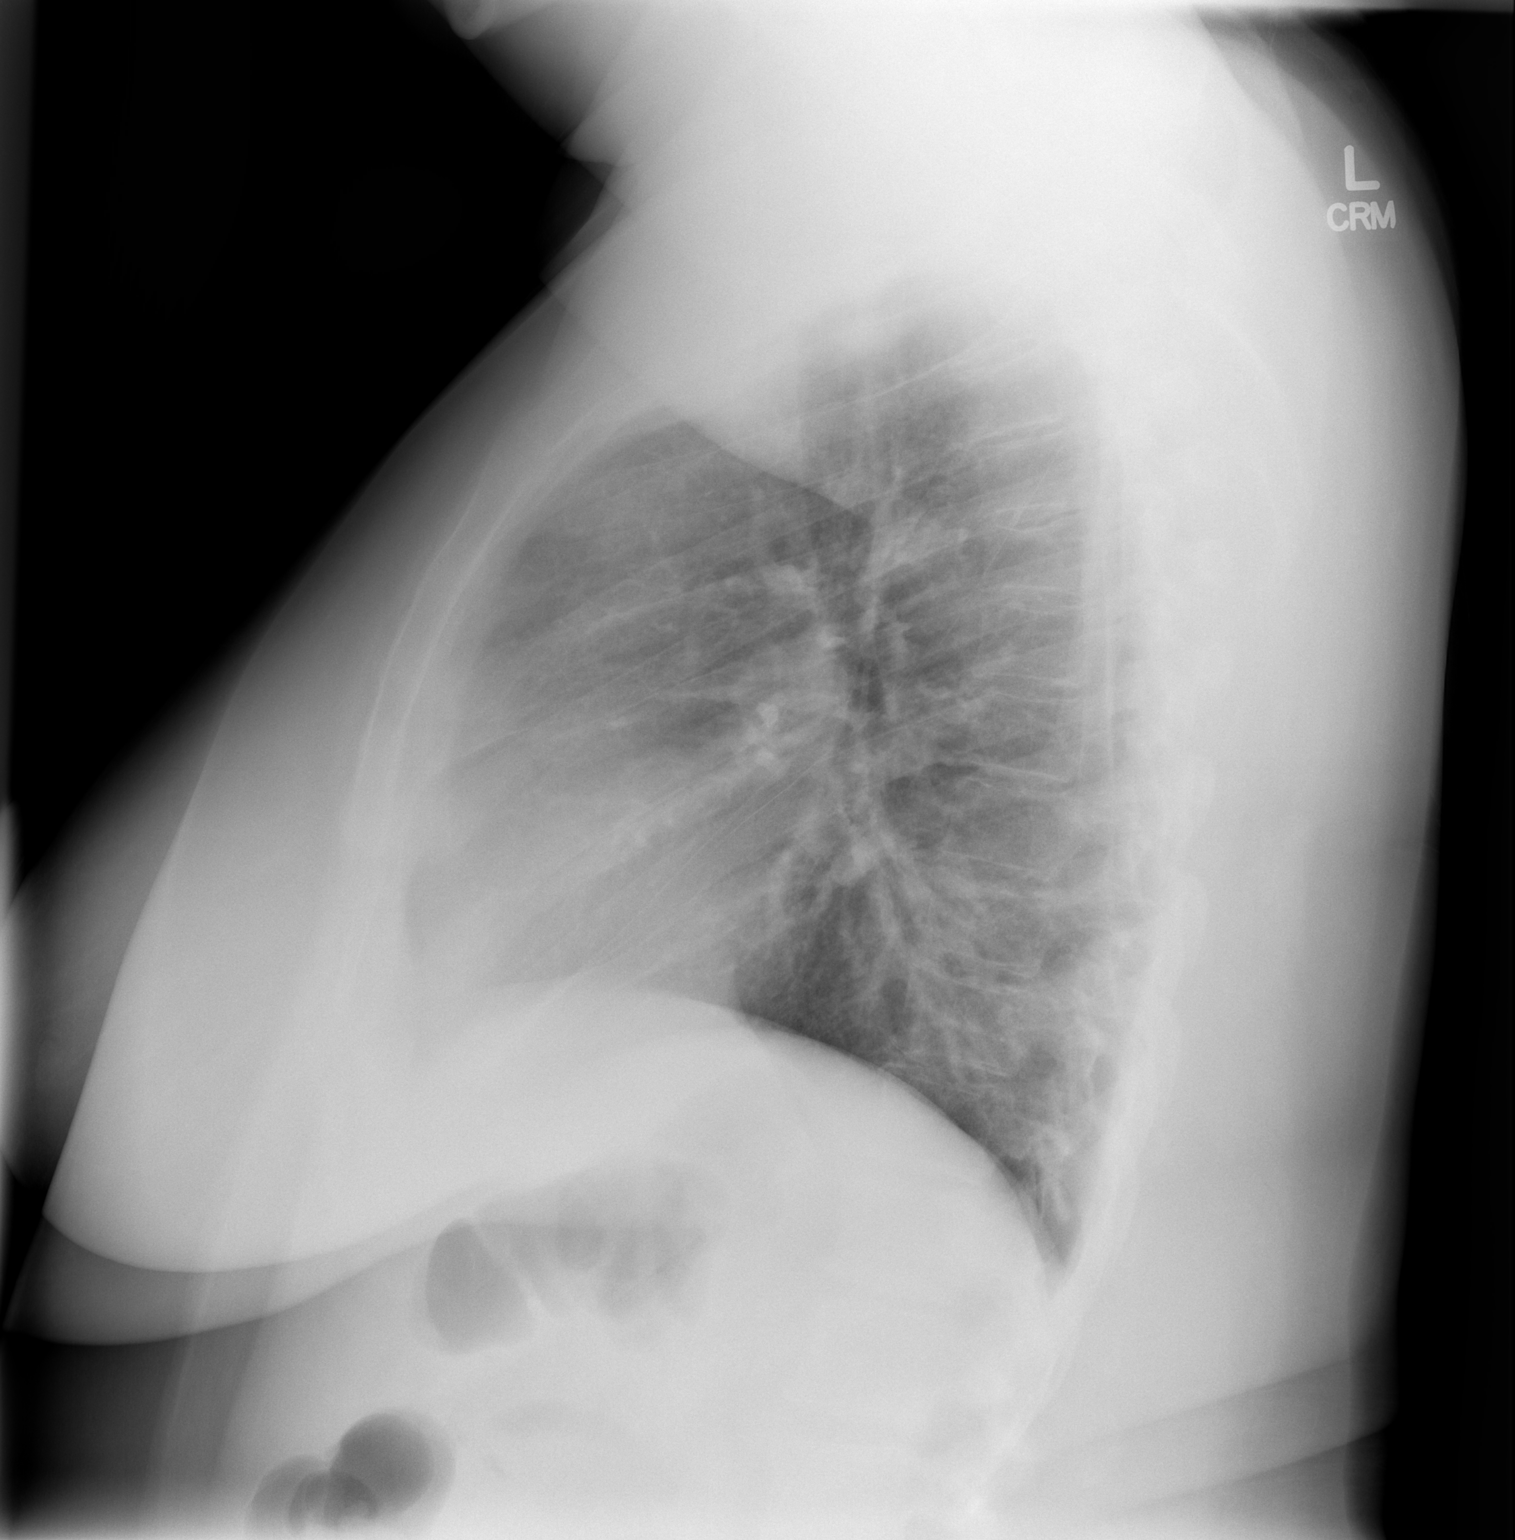

[2 of 2 positions shown; findings below may reference images not displayed]

FINDINGS: The heart size and vascular pattern are normal.  The
lungs are clear.  There are no effusions.
IMPRESSION: Normal study

## 2014-10-16 ENCOUNTER — Encounter (HOSPITAL_COMMUNITY): Payer: Self-pay | Admitting: *Deleted

## 2014-10-16 ENCOUNTER — Emergency Department (HOSPITAL_COMMUNITY): Payer: Medicaid Other

## 2014-10-16 ENCOUNTER — Emergency Department (HOSPITAL_COMMUNITY)
Admission: EM | Admit: 2014-10-16 | Discharge: 2014-10-16 | Disposition: A | Discharge status: Home / Self Care | Payer: Self-pay | Attending: Emergency Medicine | Admitting: Emergency Medicine

## 2014-10-16 DIAGNOSIS — X58XXXA Exposure to other specified factors, initial encounter: Secondary | ICD-10-CM | POA: Insufficient documentation

## 2014-10-16 DIAGNOSIS — Z87891 Personal history of nicotine dependence: Secondary | ICD-10-CM | POA: Insufficient documentation

## 2014-10-16 DIAGNOSIS — Z8709 Personal history of other diseases of the respiratory system: Secondary | ICD-10-CM | POA: Insufficient documentation

## 2014-10-16 DIAGNOSIS — Y998 Other external cause status: Secondary | ICD-10-CM | POA: Insufficient documentation

## 2014-10-16 DIAGNOSIS — I252 Old myocardial infarction: Secondary | ICD-10-CM | POA: Insufficient documentation

## 2014-10-16 DIAGNOSIS — Z862 Personal history of diseases of the blood and blood-forming organs and certain disorders involving the immune mechanism: Secondary | ICD-10-CM | POA: Insufficient documentation

## 2014-10-16 DIAGNOSIS — S4992XA Unspecified injury of left shoulder and upper arm, initial encounter: Secondary | ICD-10-CM | POA: Insufficient documentation

## 2014-10-16 DIAGNOSIS — Z880 Allergy status to penicillin: Secondary | ICD-10-CM | POA: Insufficient documentation

## 2014-10-16 DIAGNOSIS — Y9289 Other specified places as the place of occurrence of the external cause: Secondary | ICD-10-CM | POA: Insufficient documentation

## 2014-10-16 DIAGNOSIS — M25512 Pain in left shoulder: Secondary | ICD-10-CM

## 2014-10-16 DIAGNOSIS — Y9389 Activity, other specified: Secondary | ICD-10-CM | POA: Insufficient documentation

## 2014-10-16 MED ORDER — HYDROCODONE-ACETAMINOPHEN 5-325 MG PO TABS: 2.0000 | ORAL_TABLET | ORAL | Status: DC | PRN

## 2014-10-16 MED ORDER — KETOROLAC TROMETHAMINE 30 MG/ML IJ SOLN
30.0000 mg | Freq: Once | INTRAMUSCULAR | Status: AC
  Administered 2014-10-16: 30 mg via INTRAMUSCULAR
  Filled 2014-10-16: qty 1

## 2014-10-16 MED ORDER — NAPROXEN 500 MG PO TABS: 500.0000 mg | ORAL_TABLET | Freq: Two times a day (BID) | ORAL | Status: DC
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
## 2014-10-16 NOTE — ED Notes (Signed)
Pt reports heavy lifting at work and felt a "pop" and now has pain to left shoulder, neck and arm.

## 2014-10-16 NOTE — Discharge Instructions (Signed)
Musculoskeletal Pain Take naproxen for pain and hydrocodone for breakthrough pain. Follow up with orthopedics. Musculoskeletal pain is muscle and boney aches and pains. These pains can occur in any part of the body. Your caregiver may treat you without knowing the cause of the pain. They may treat you if blood or urine tests, X-rays, and other tests were normal.  CAUSES There is often not a definite cause or reason for these pains. These pains may be caused by a type of germ (virus). The discomfort may also come from overuse. Overuse includes working out too hard when your body is not fit. Boney aches also come from weather changes. Bone is sensitive to atmospheric pressure changes. HOME CARE INSTRUCTIONS   Ask when your test results will be ready. Make sure you get your test results.  Only take over-the-counter or prescription medicines for pain, discomfort, or fever as directed by your caregiver. If you were given medications for your condition, do not drive, operate machinery or power tools, or sign legal documents for 24 hours. Do not drink alcohol. Do not take sleeping pills or other medications that may interfere with treatment.  Continue all activities unless the activities cause more pain. When the pain lessens, slowly resume normal activities. Gradually increase the intensity and duration of the activities or exercise.  During periods of severe pain, bed rest may be helpful. Lay or sit in any position that is comfortable.  Putting ice on the injured area.  Put ice in a bag.  Place a towel between your skin and the bag.  Leave the ice on for 15 to 20 minutes, 3 to 4 times a day.  Follow up with your caregiver for continued problems and no reason can be found for the pain. If the pain becomes worse or does not go away, it may be necessary to repeat tests or do additional testing. Your caregiver may need to look further for a possible cause. SEEK IMMEDIATE MEDICAL CARE IF:  You have  pain that is getting worse and is not relieved by medications.  You develop chest pain that is associated with shortness or breath, sweating, feeling sick to your stomach (nauseous), or throw up (vomit).  Your pain becomes localized to the abdomen.  You develop any new symptoms that seem different or that concern you. MAKE SURE YOU:   Understand these instructions.  Will watch your condition.  Will get help right away if you are not doing well or get worse. Document Released: 03/02/2005 Document Revised: 05/25/2011 Document Reviewed: 11/04/2012 [facility] Patient Information 2015 [place], [place]. This information is not intended to replace advice given to you by your health care provider. Make sure you discuss any questions you have with your health care provider.

## 2014-11-12 IMAGING — CT CT HEAD W/O CM
1 of 4 series · 14 of 30 positions shown, 18 images · non-contrast
Comparison: 10/12/2010.

CLINICAL DATA: Seizure.

CT HEAD WITHOUT CONTRAST
TECHNIQUE: Contiguous axial images were obtained from the base of
the skull through the vertex without contrast.

[Series 5: head trauma 2.4 h60s · axial · 0.46mm/px · z∈[+1134,+1284]mm · 14 of 72 slices shown, 18 images]
[im 5/72  brain]
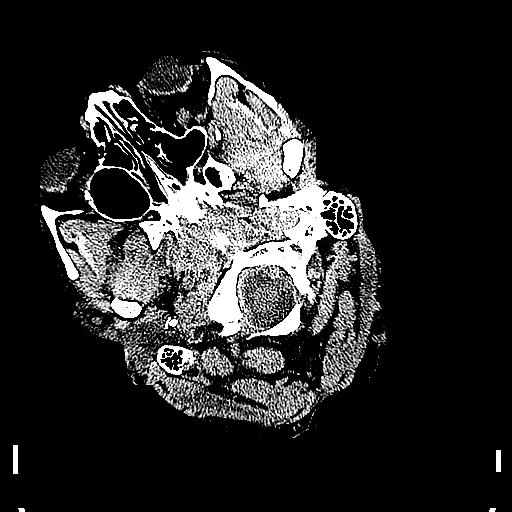
[im 5/72  bone]
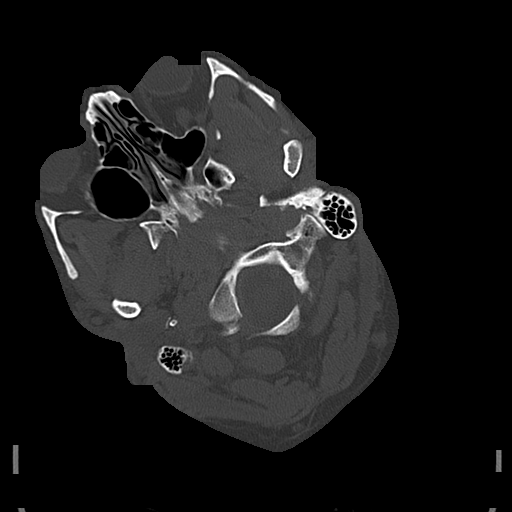
[im 10/72  brain]
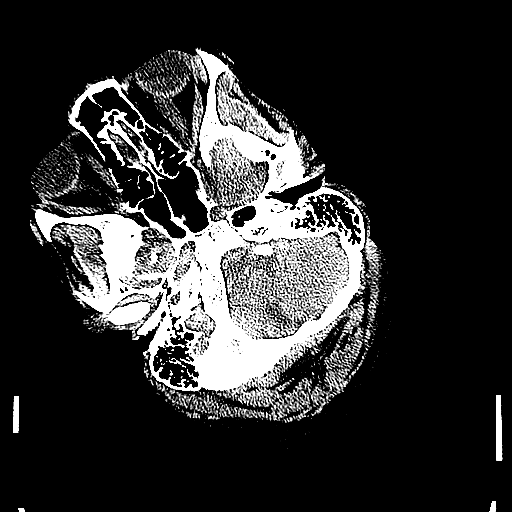
[im 15/72  brain]
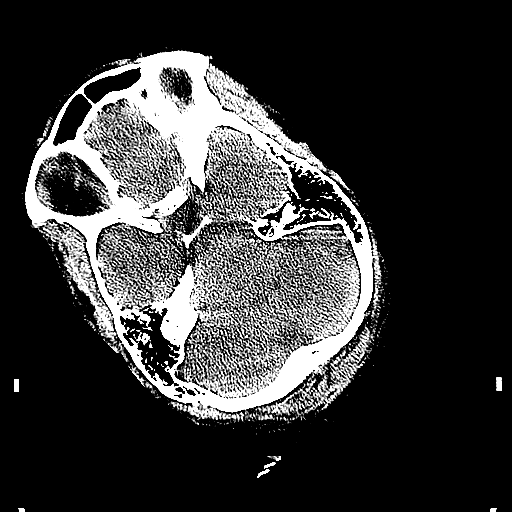
[im 19/72  brain]
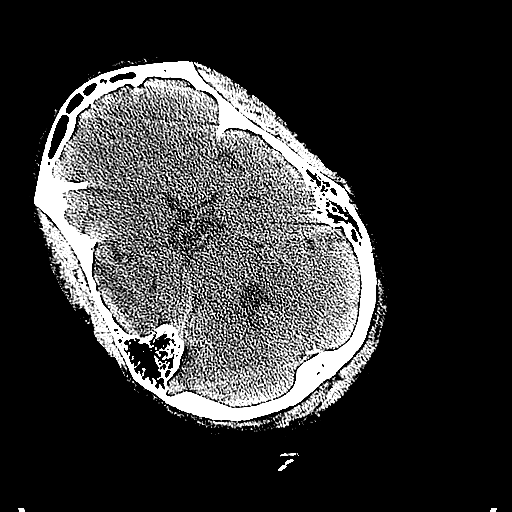
[im 24/72  brain]
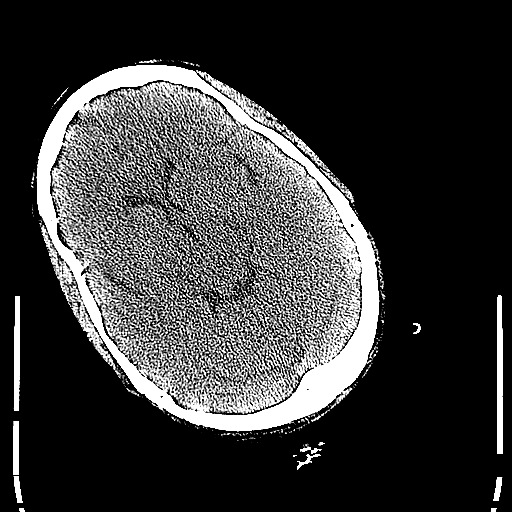
[im 24/72  bone]
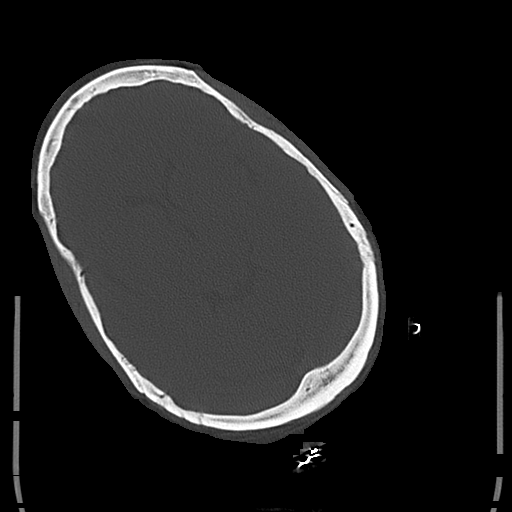
[im 29/72  brain]
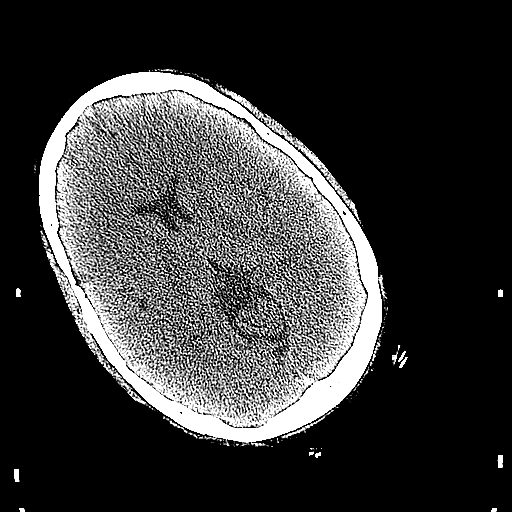
[im 34/72  brain]
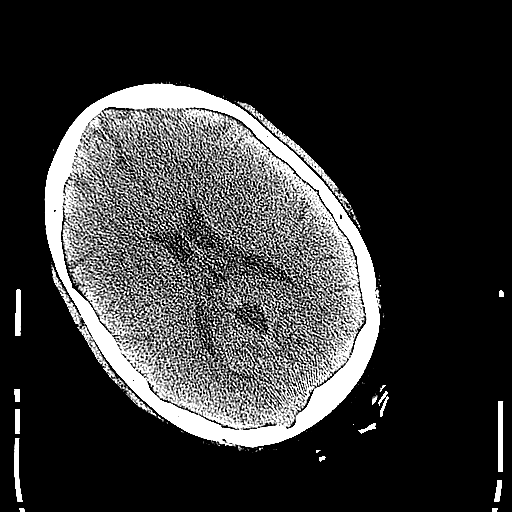
[im 38/72  brain]
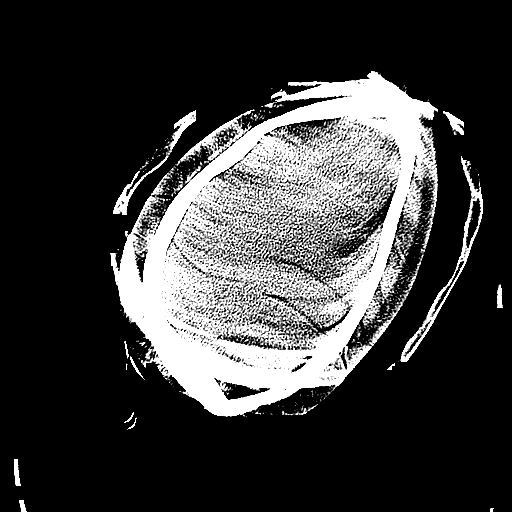
[im 43/72  brain]
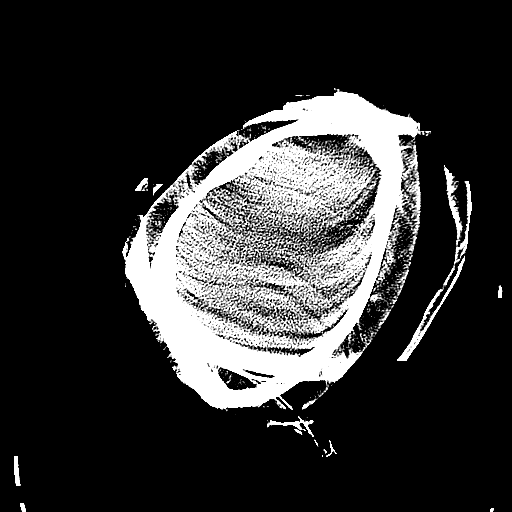
[im 43/72  bone]
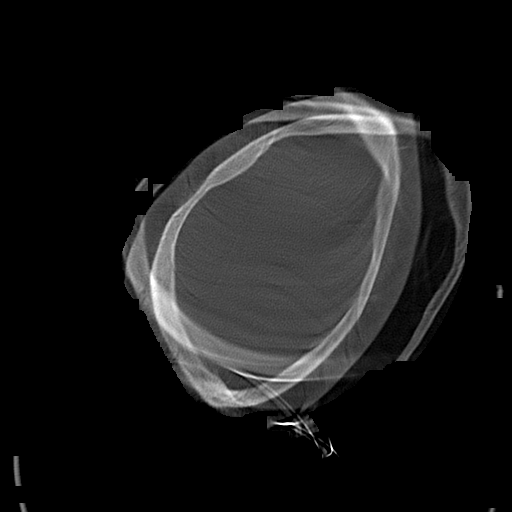
[im 48/72  brain]
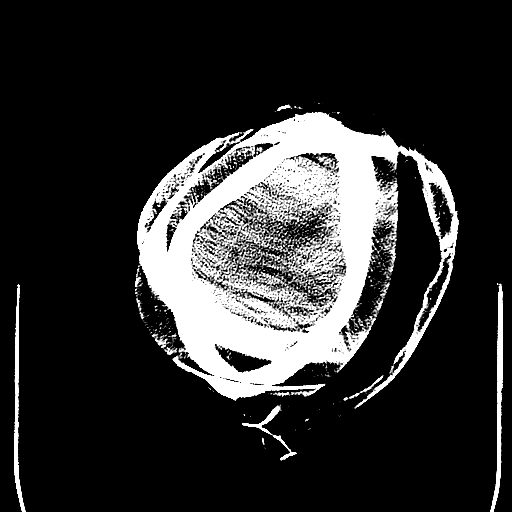
[im 53/72  brain]
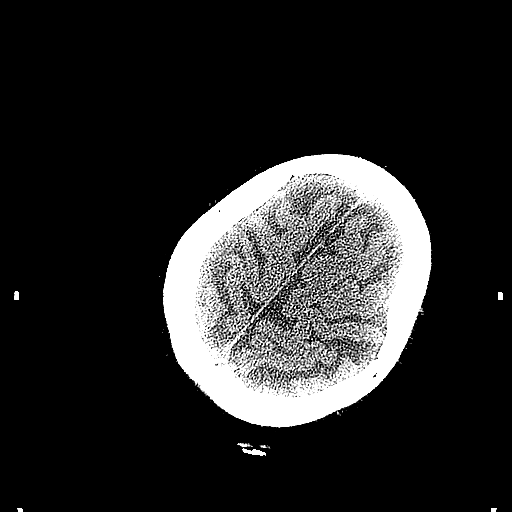
[im 57/72  brain]
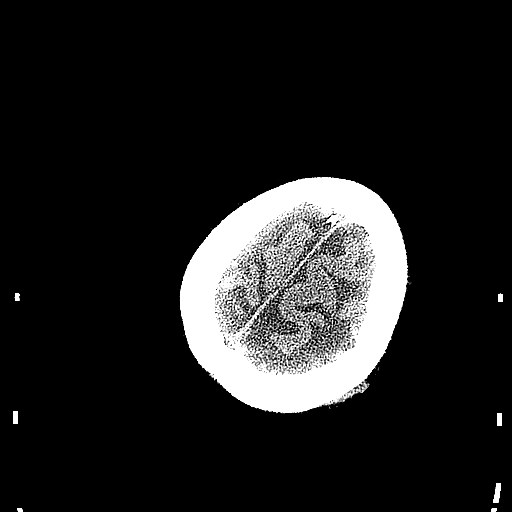
[im 62/72  brain]
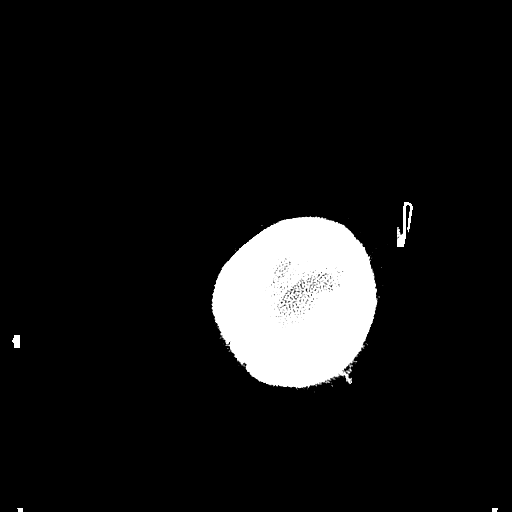
[im 62/72  bone]
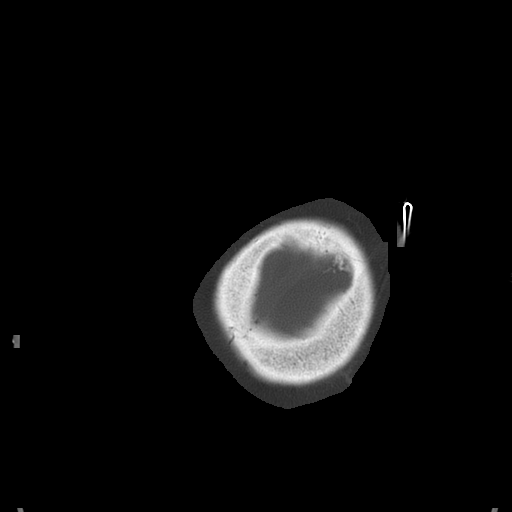
[im 67/72  brain]
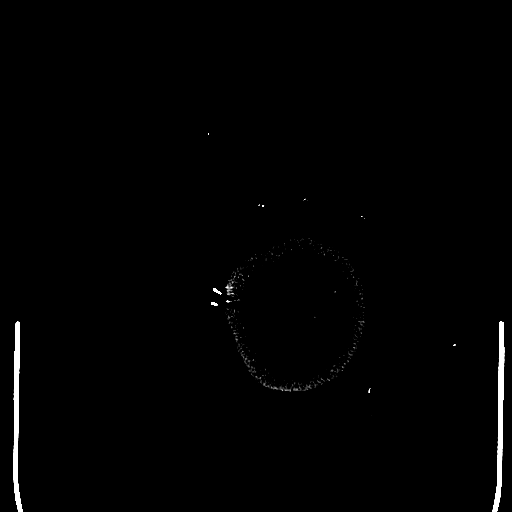

[14 of 30 positions shown; findings below may reference images not displayed]

FINDINGS: The age advanced cerebral atrophy.  The ventricles are
normal.  No extra-axial fluid collections.  No CT findings for
acute hemispheric infarction and/or intracranial hemorrhage.  No
mass lesions.

The bony structures are intact.  The paranasal sinuses and mastoid
air cells are clear.
IMPRESSION: Mild age advanced cerebral atrophy.
No acute intracranial findings or mass lesions.

## 2015-06-13 IMAGING — CR DG WRIST COMPLETE 3+V*L*
4 series · 4 of 4 positions shown · non-contrast
Comparison: None.

CLINICAL DATA: Pain post fall.

EXAM:
LEFT WRIST - COMPLETE 3+ VIEW

[x wrist pa left]
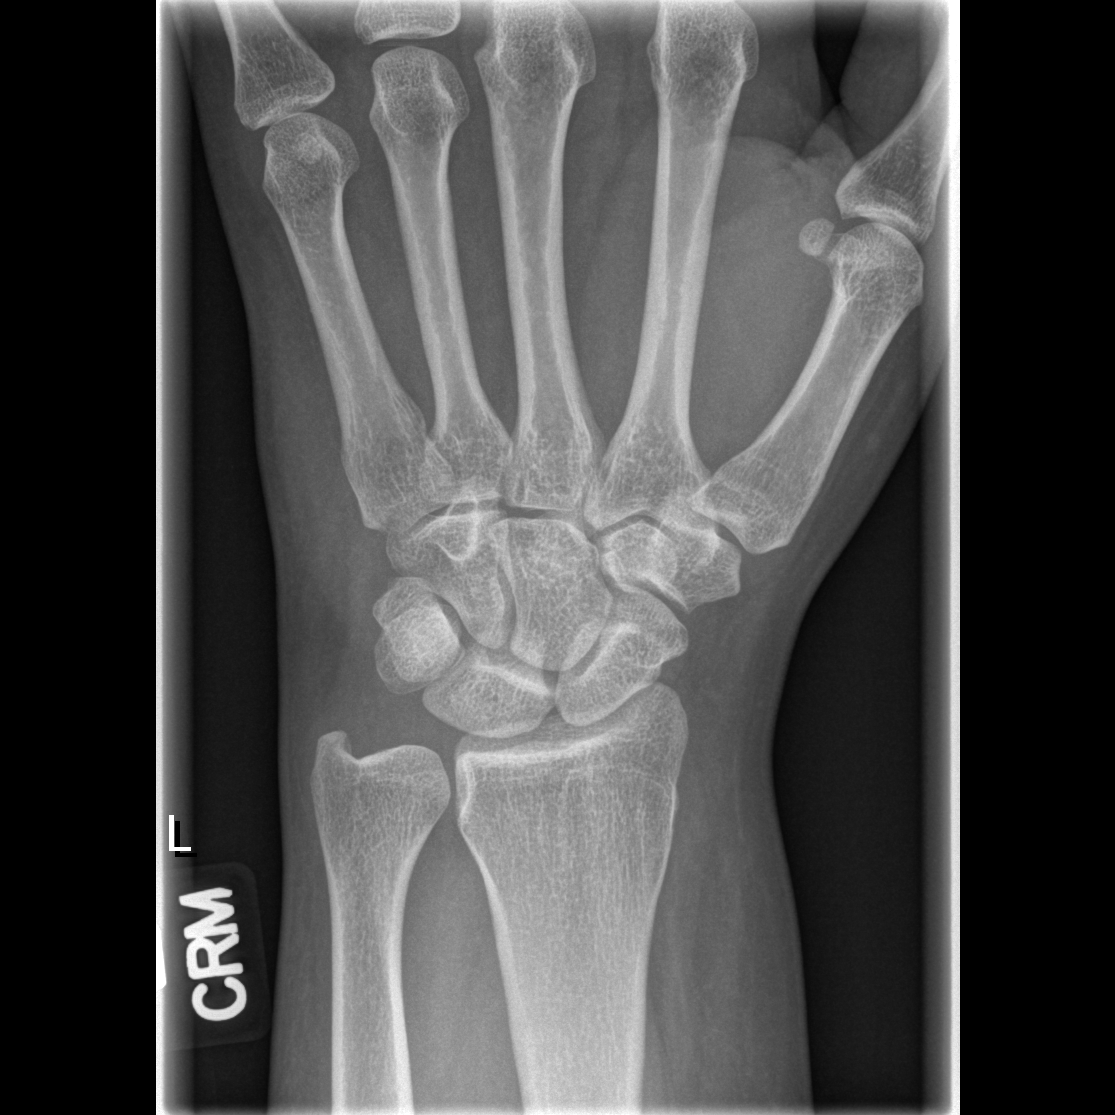

[x wrist obl left]
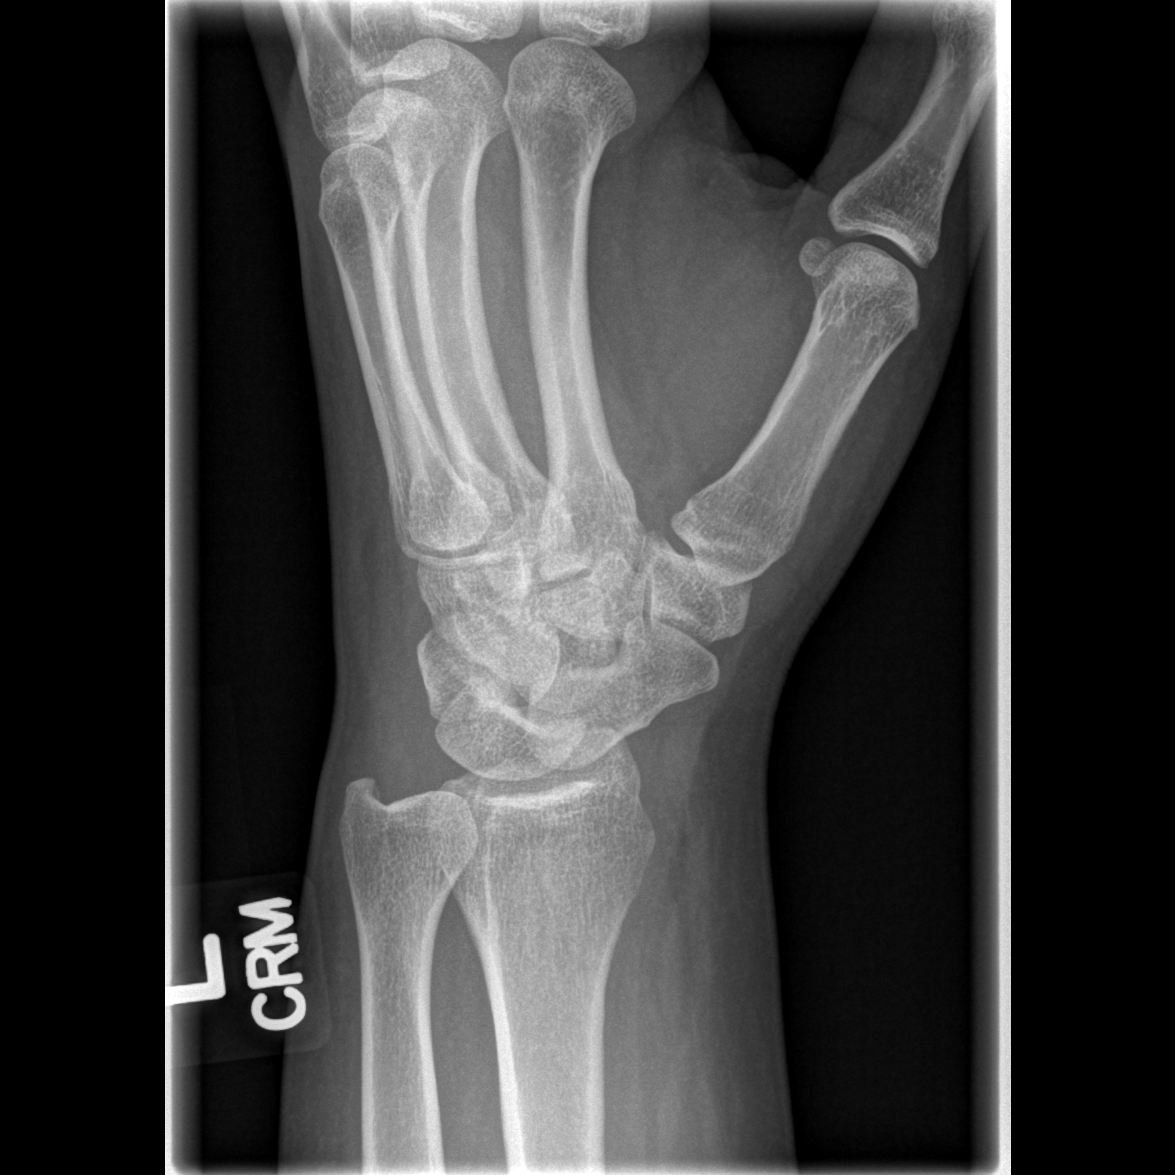

[x wrist lat left]
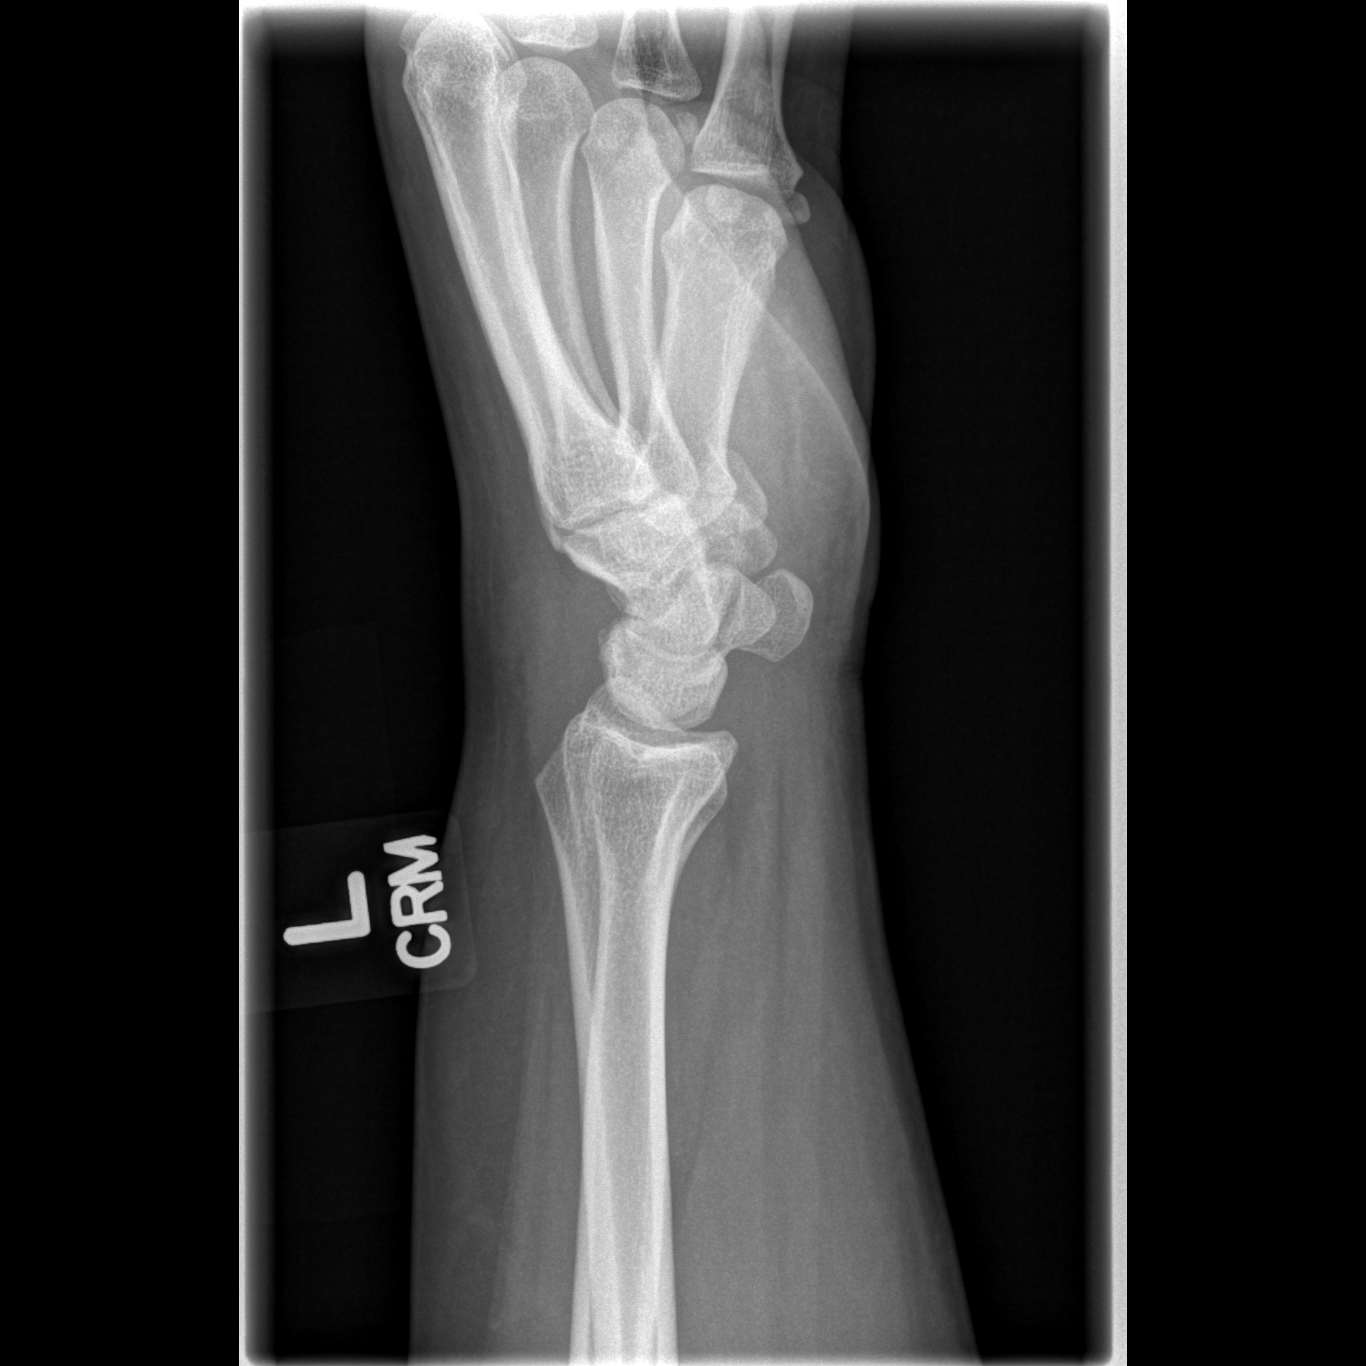

[x navicular]
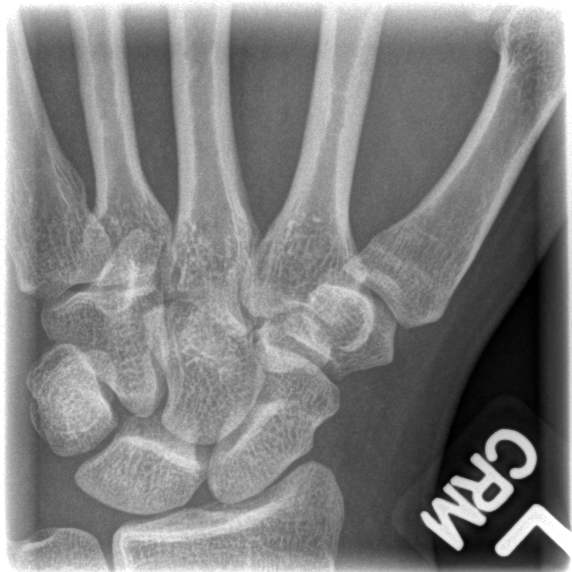

[4 of 4 positions shown; findings below may reference images not displayed]

FINDINGS: There is no evidence of fracture or dislocation. There is no
evidence of arthropathy or other focal bone abnormality. Soft
tissues are unremarkable. Carpal rows intact.
IMPRESSION: Negative.

## 2015-06-13 IMAGING — CR DG ANKLE COMPLETE 3+V*L*
3 series · 3 of 3 positions shown · non-contrast
Comparison: 08/20/2011

CLINICAL DATA: Pain post fall.

EXAM:
LEFT ANKLE COMPLETE - 3+ VIEW

[t ankle joint ap left]
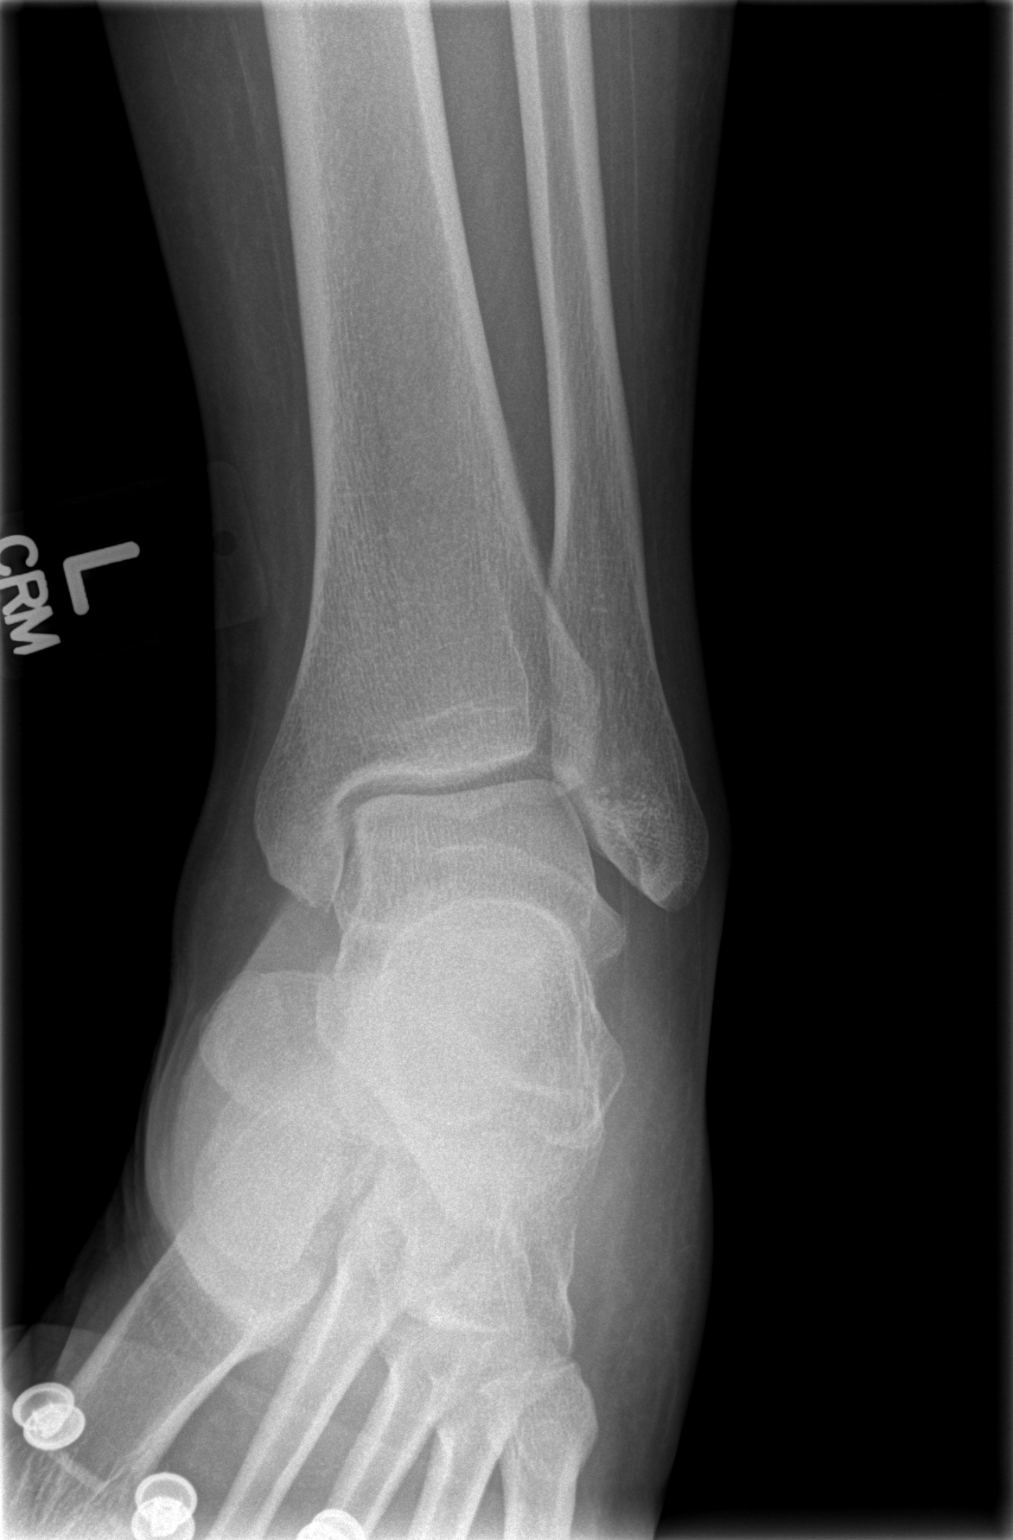

[t ankle joint oblique left]
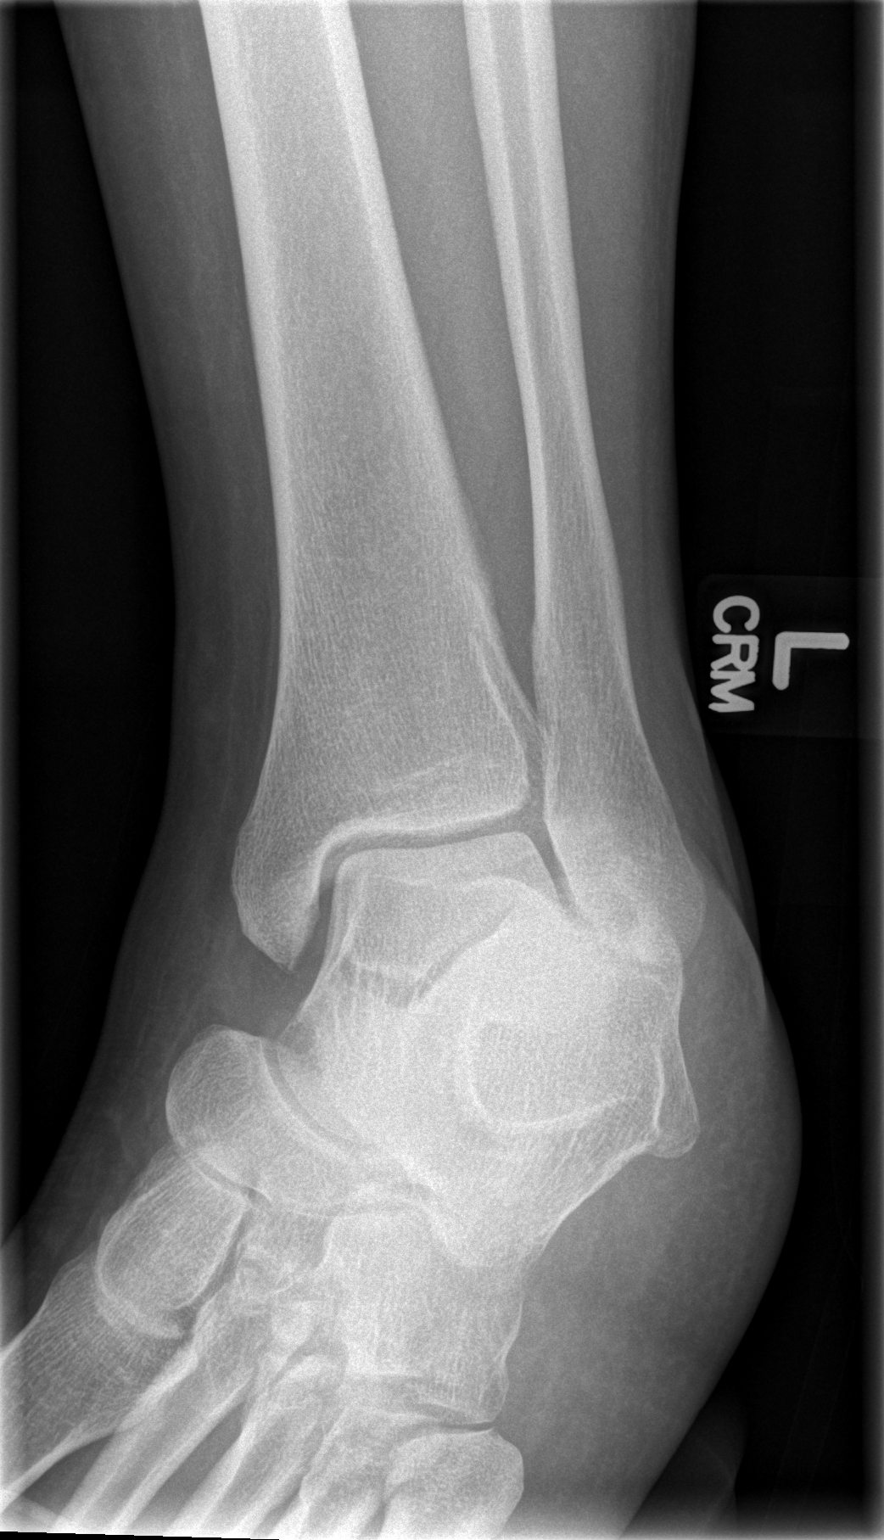

[t ankle joint lat left]
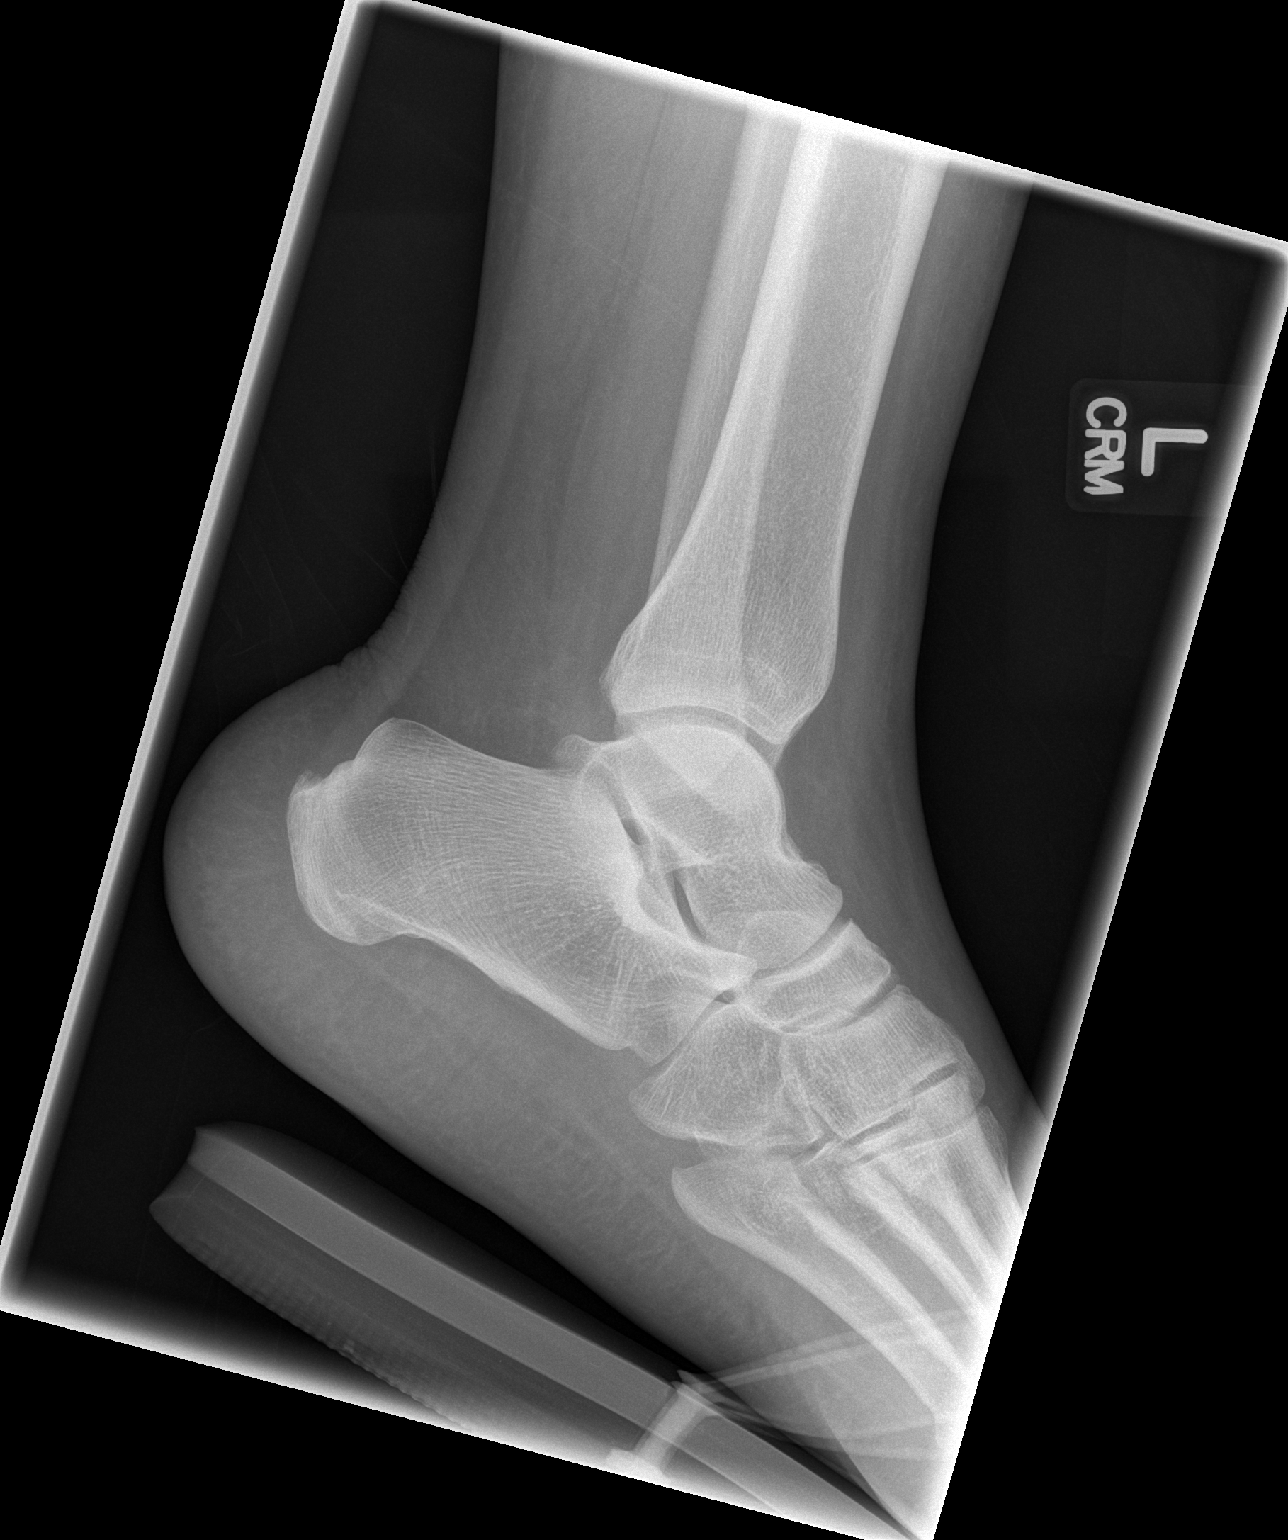

[3 of 3 positions shown; findings below may reference images not displayed]

FINDINGS: There is no evidence of fracture, dislocation, or joint effusion.
There is no evidence of arthropathy or other focal bone abnormality.
Soft tissues are unremarkable. Ankle mortise intact.
IMPRESSION: Negative.

## 2015-06-13 IMAGING — CR DG KNEE COMPLETE 4+V*L*
4 series · 4 of 4 positions shown · non-contrast
Comparison: None.

CLINICAL DATA: Pain post fall.

EXAM:
LEFT KNEE - COMPLETE 4+ VIEW

[t knee ap left]
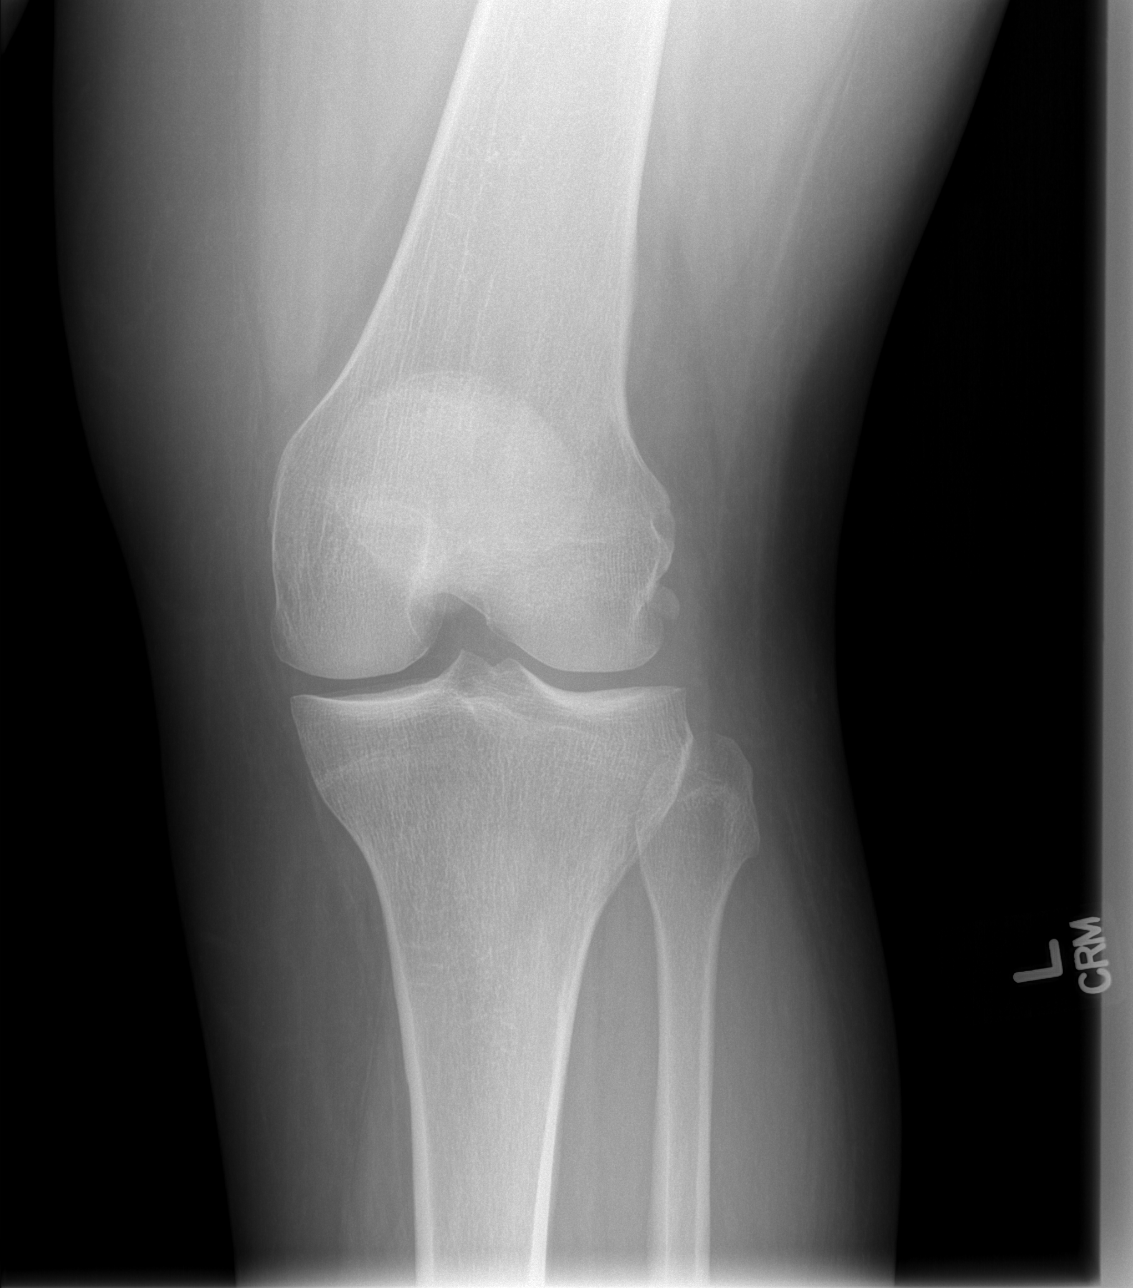

[t knee oblique left (1 of 2)]
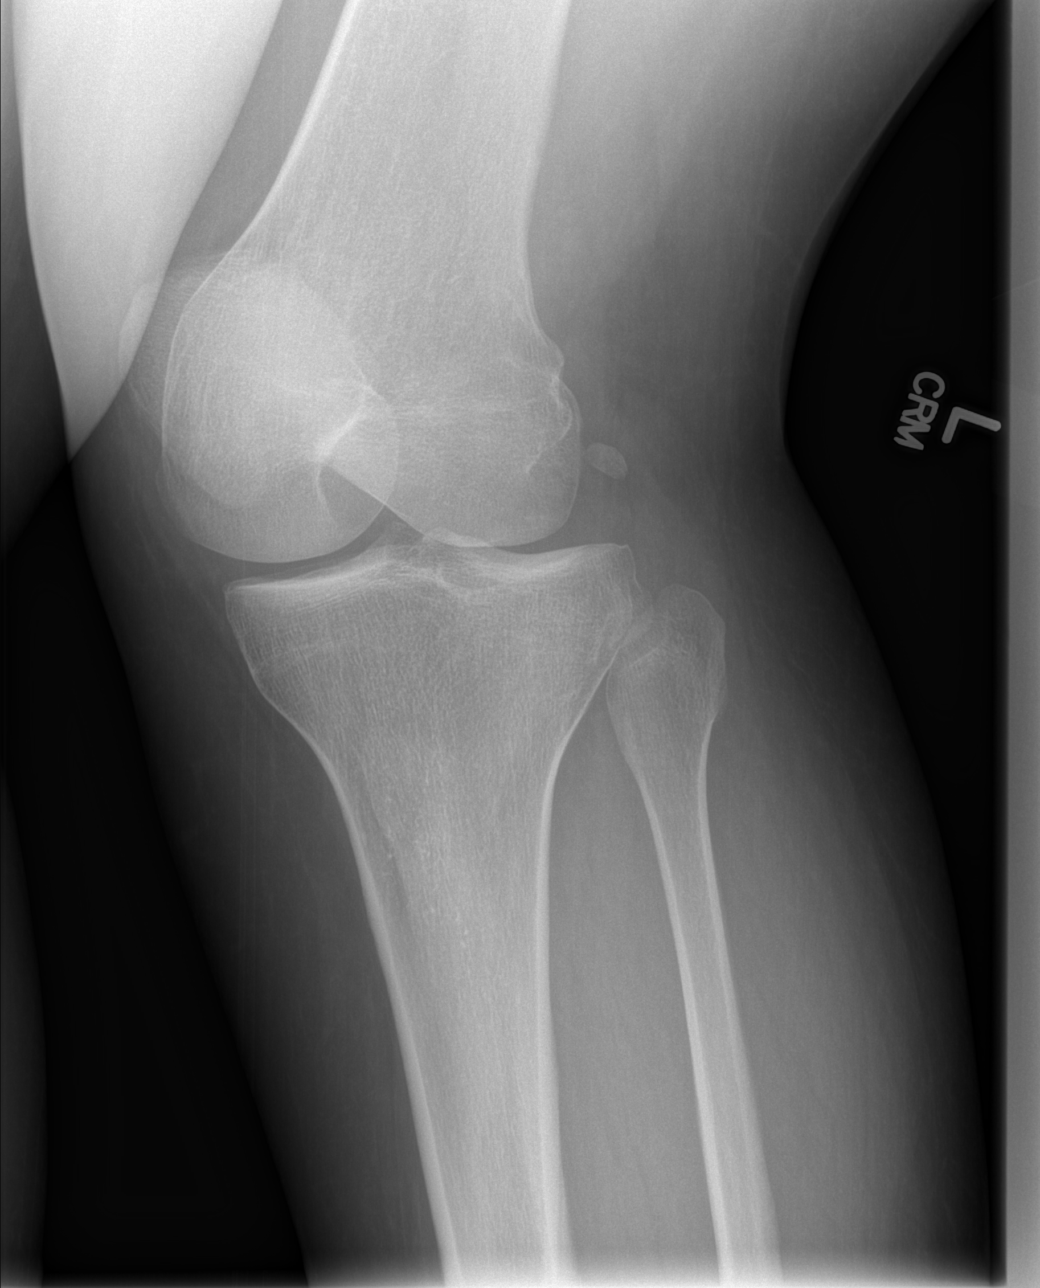

[t knee oblique left (2 of 2)]
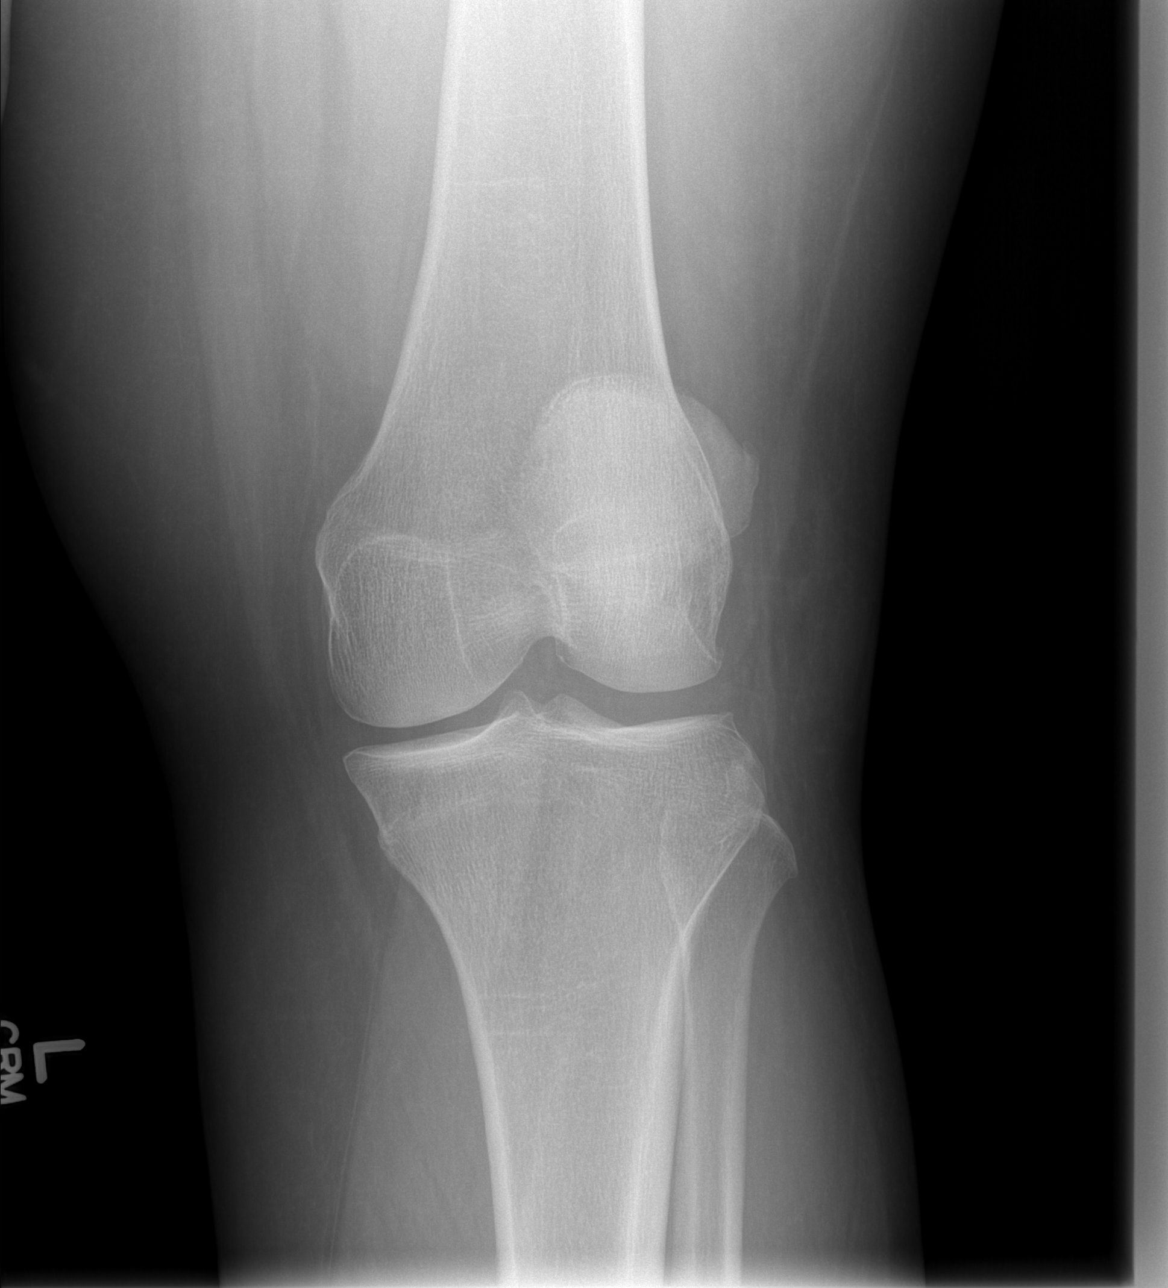

[t knee lat left]
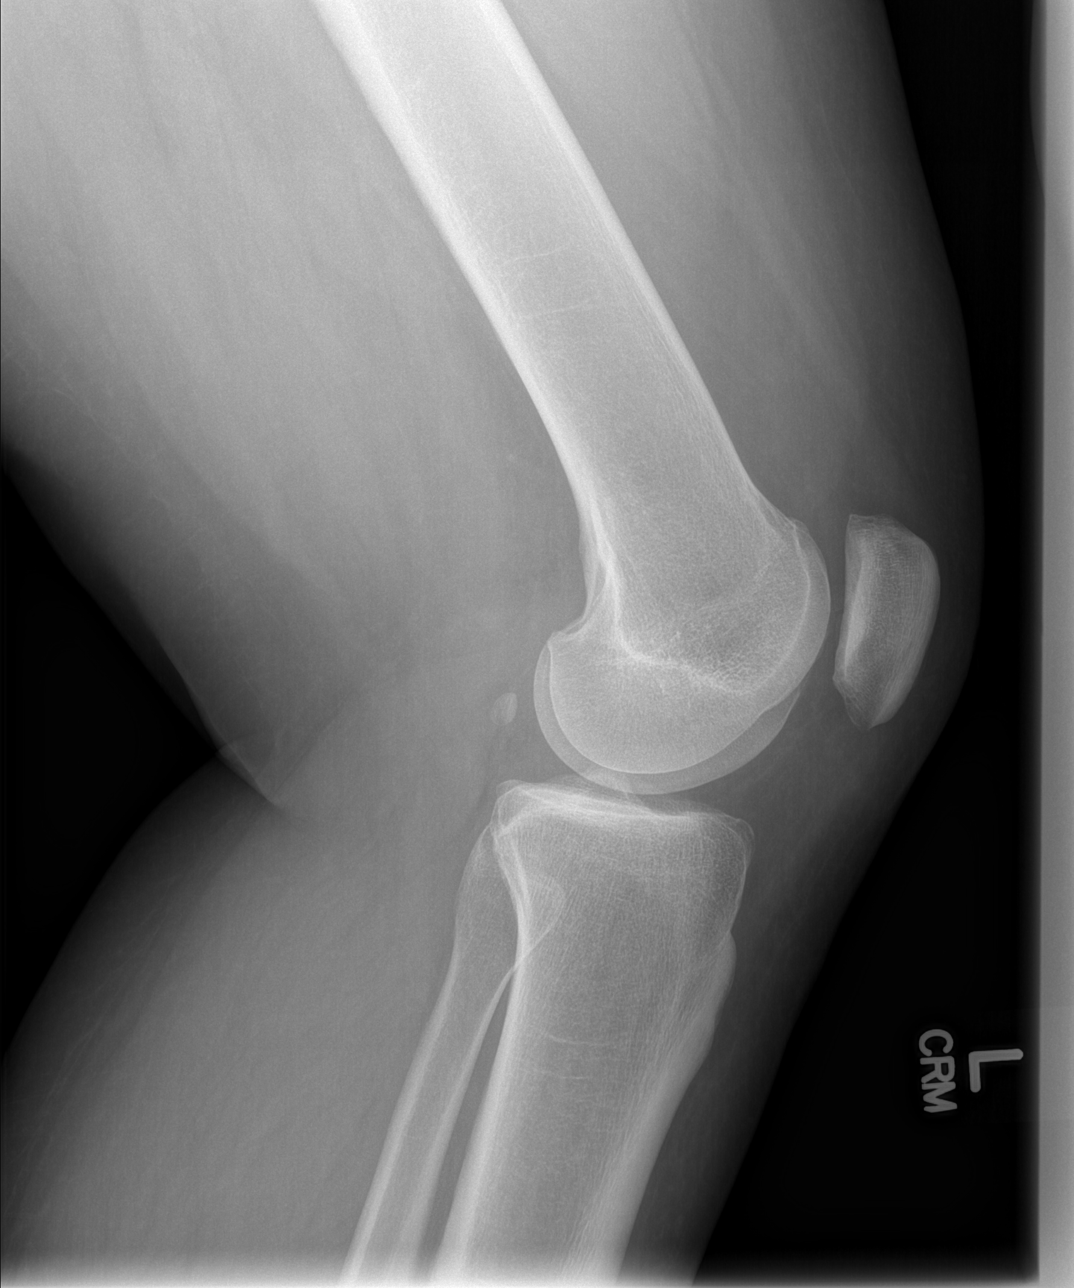

[4 of 4 positions shown; findings below may reference images not displayed]

FINDINGS: There is no evidence of fracture, dislocation, or joint effusion.
There is no evidence of arthropathy or other focal bone abnormality.
Soft tissues are unremarkable.
IMPRESSION: Negative.

## 2015-08-19 IMAGING — CR DG FOOT COMPLETE 3+V*L*
3 series · 3 of 3 positions shown · non-contrast
Comparison: None.

CLINICAL DATA: Left foot pain and swelling. Previous fracture in
the 3rd and 4th toes.

EXAM:
LEFT FOOT - COMPLETE 3+ VIEW

[x foot ap left]
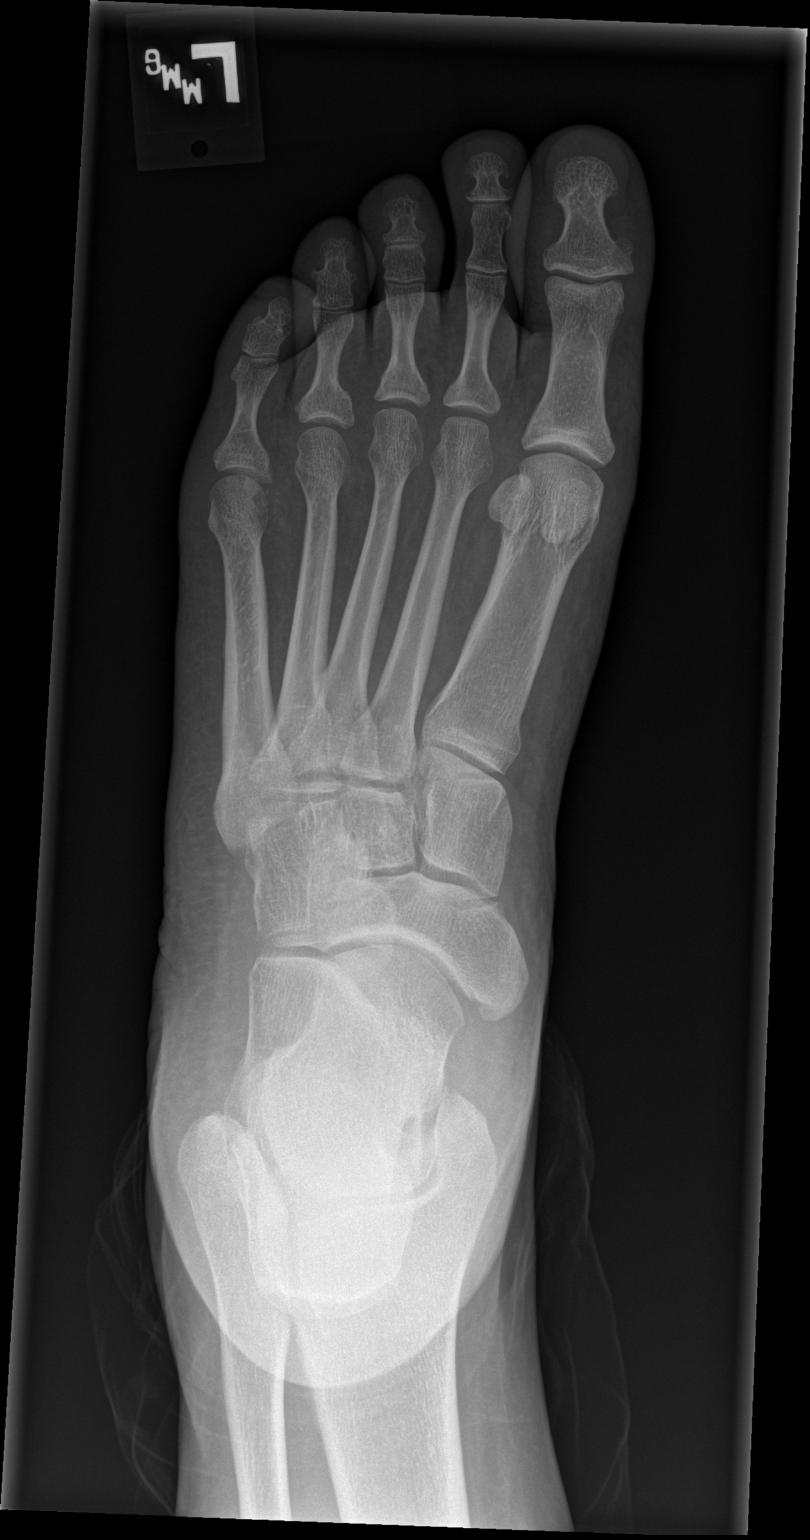

[x foot obl left]
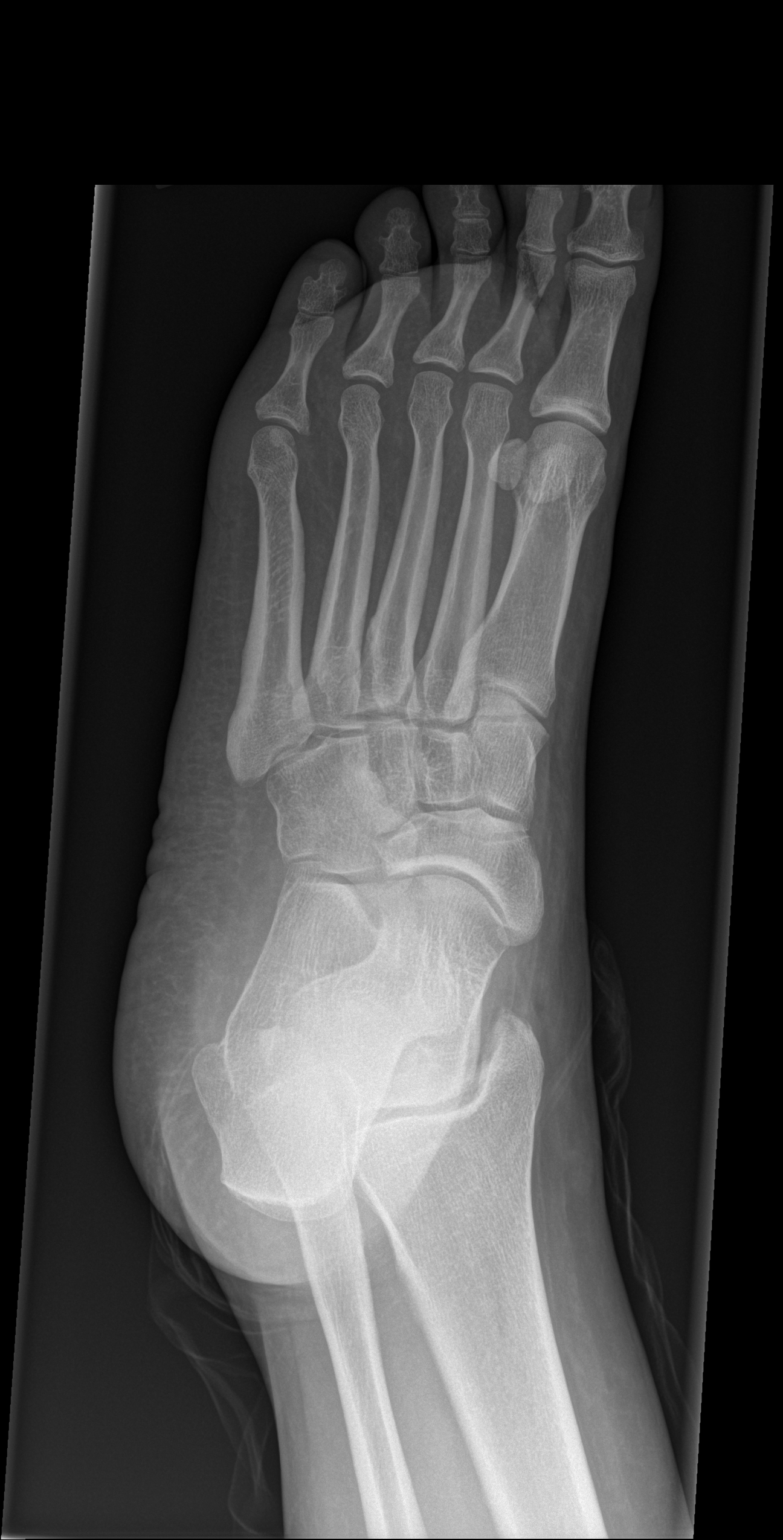

[x foot lat left]
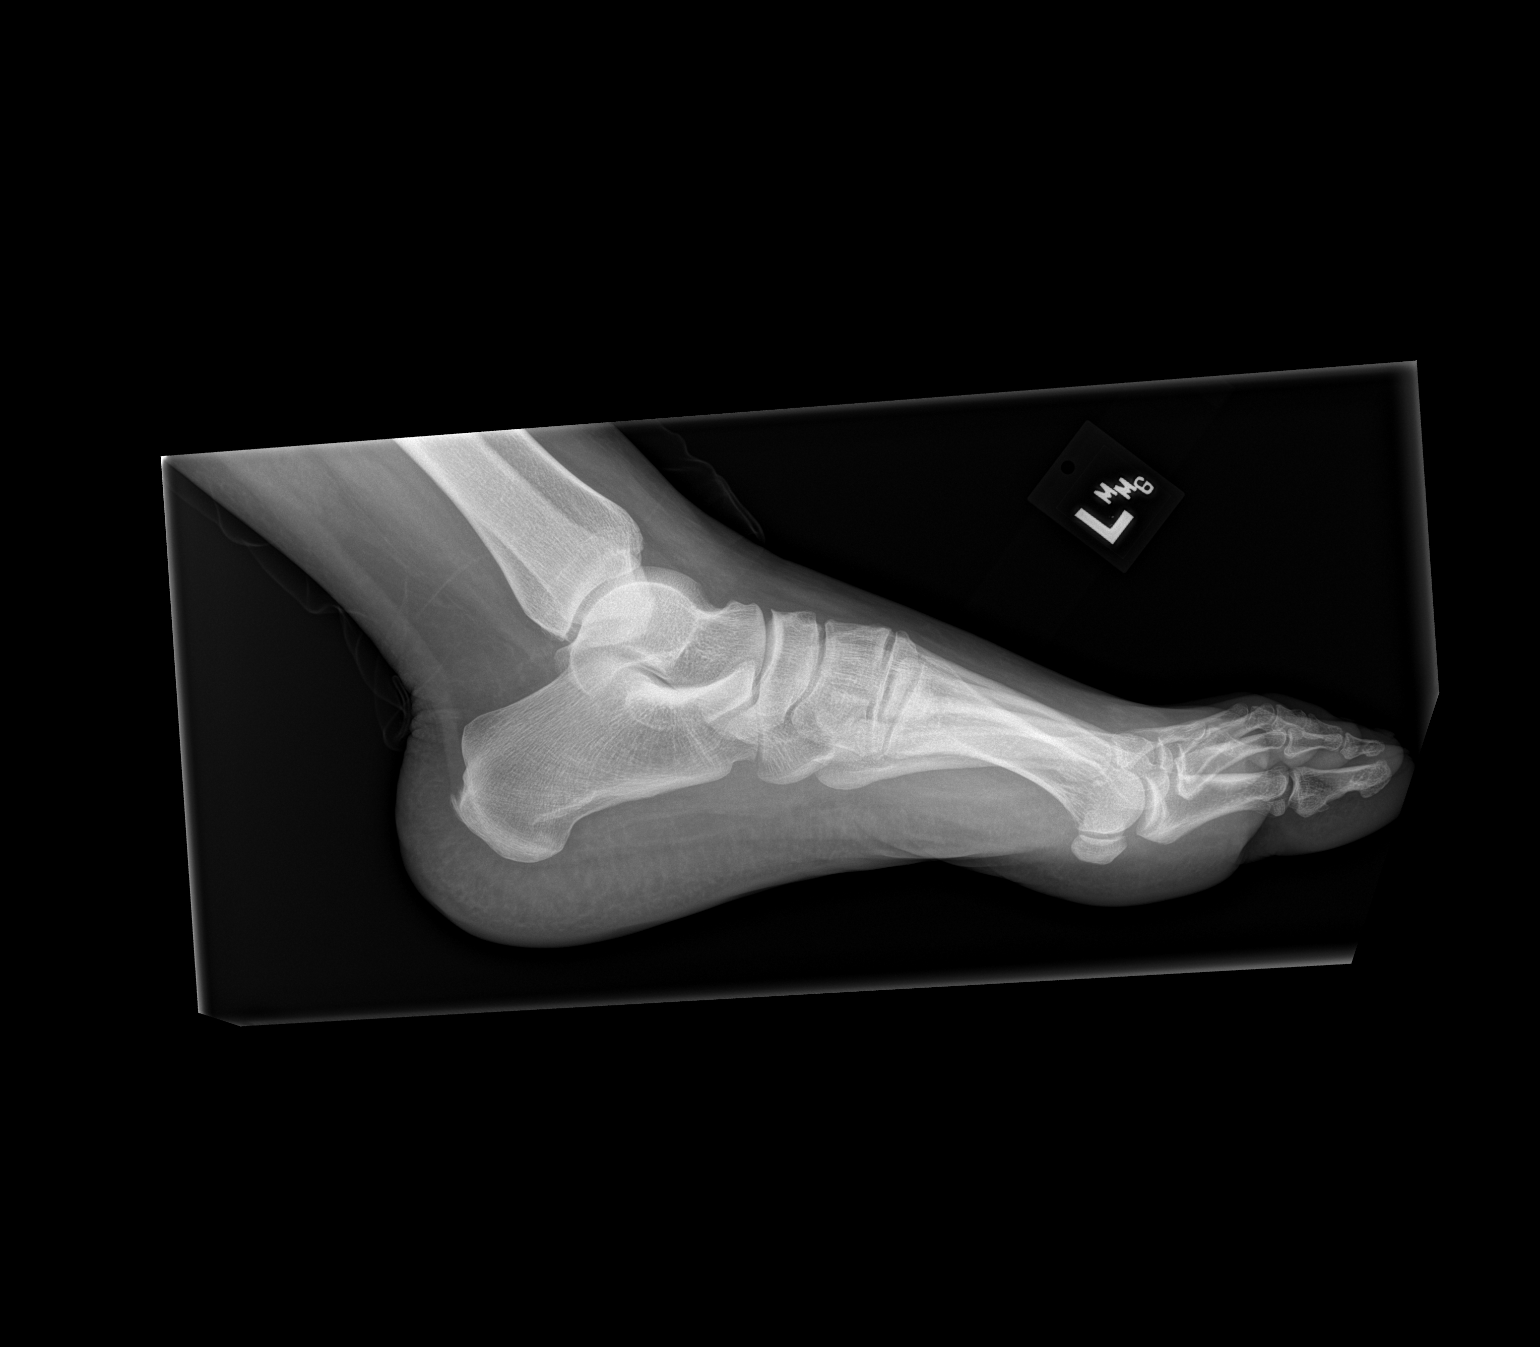

[3 of 3 positions shown; findings below may reference images not displayed]

FINDINGS: There is no evidence of fracture or dislocation. There is no
evidence of arthropathy or other focal bone abnormality. Soft
tissues are unremarkable.
IMPRESSION: Negative.

## 2015-08-19 IMAGING — CR DG CHEST 2V
2 series · 2 of 2 positions shown · non-contrast
Comparison: 04/05/2012

CLINICAL DATA: Cough and shortness of breath. Foot pain and
swelling.

EXAM:
CHEST  2 VIEW

[w chest pa]
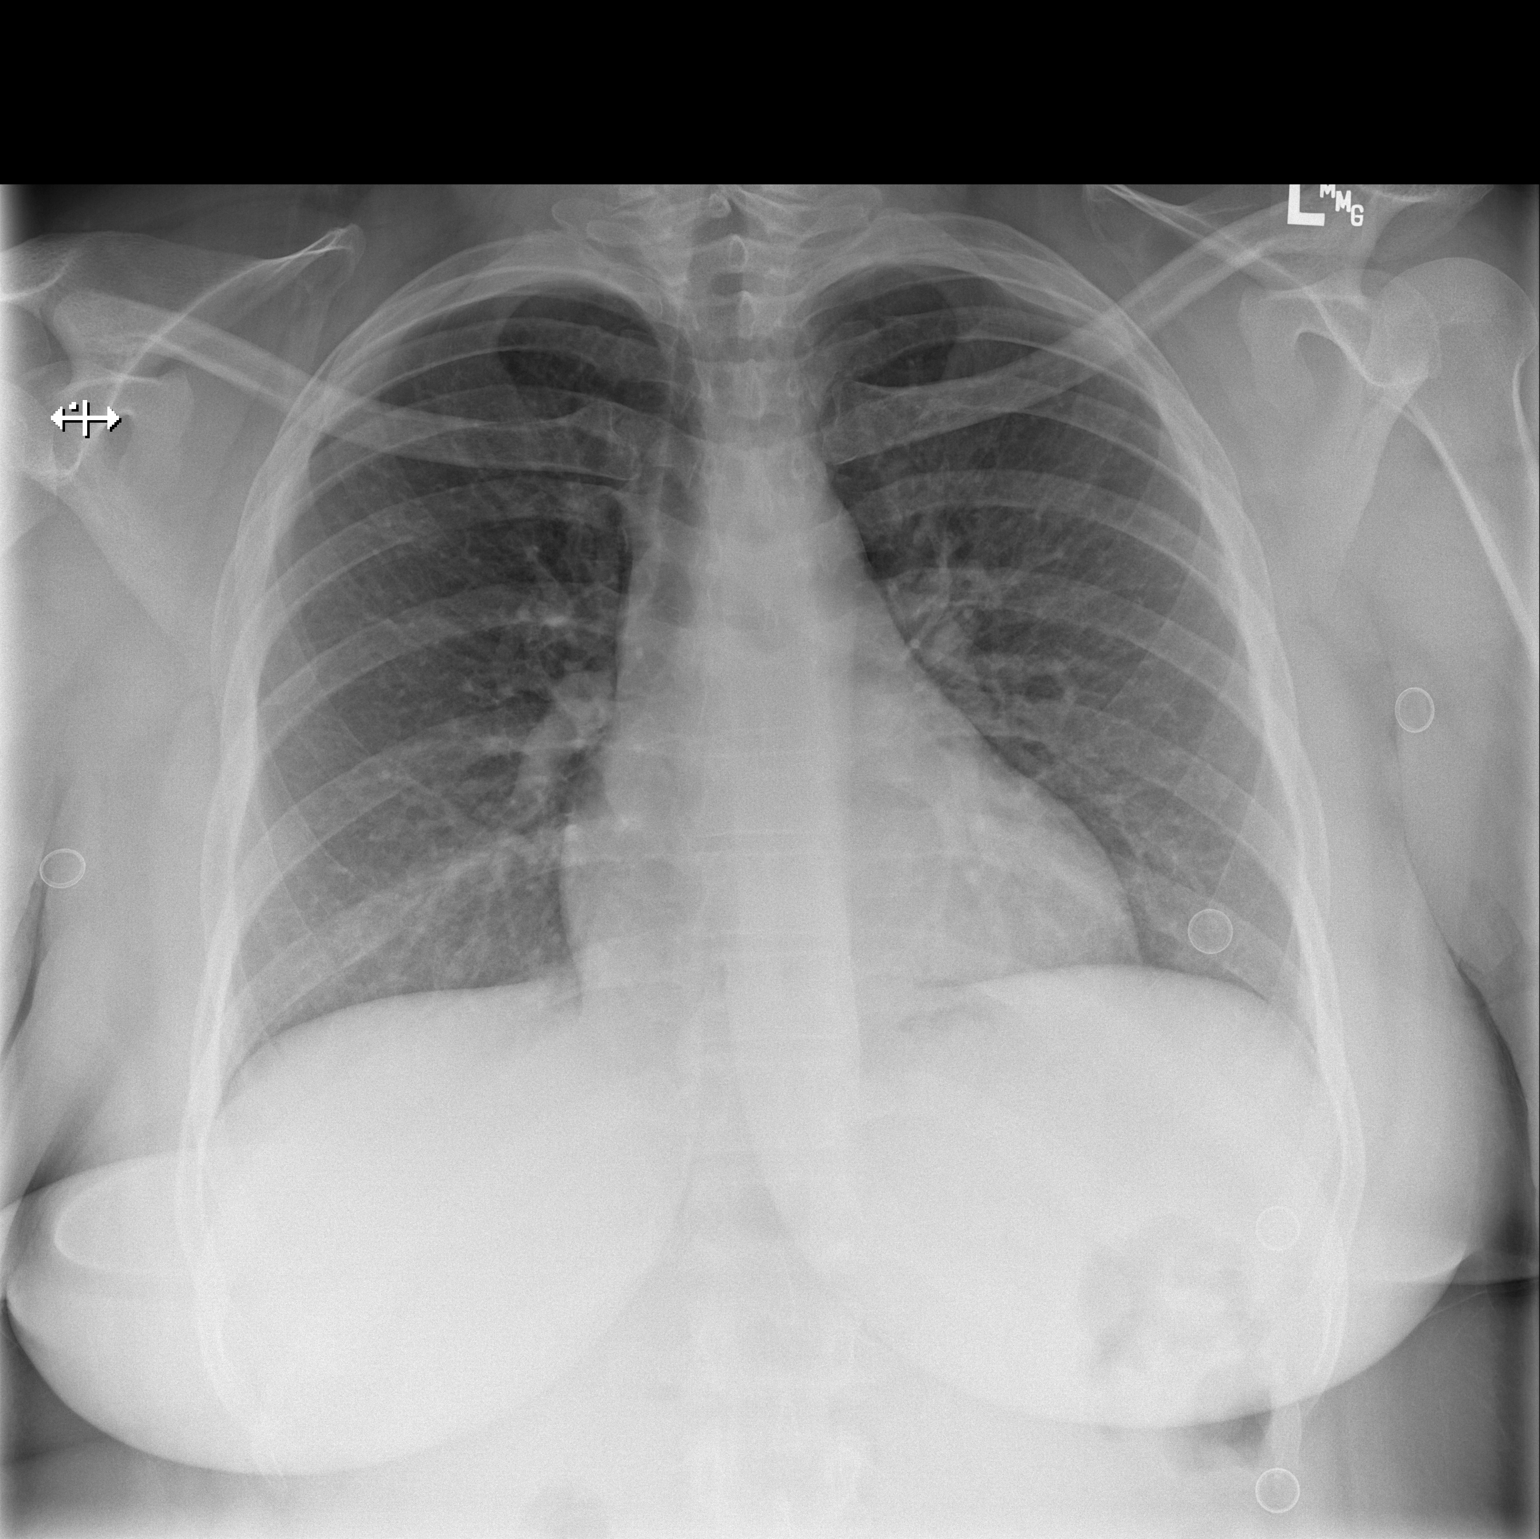

[w chest lat]
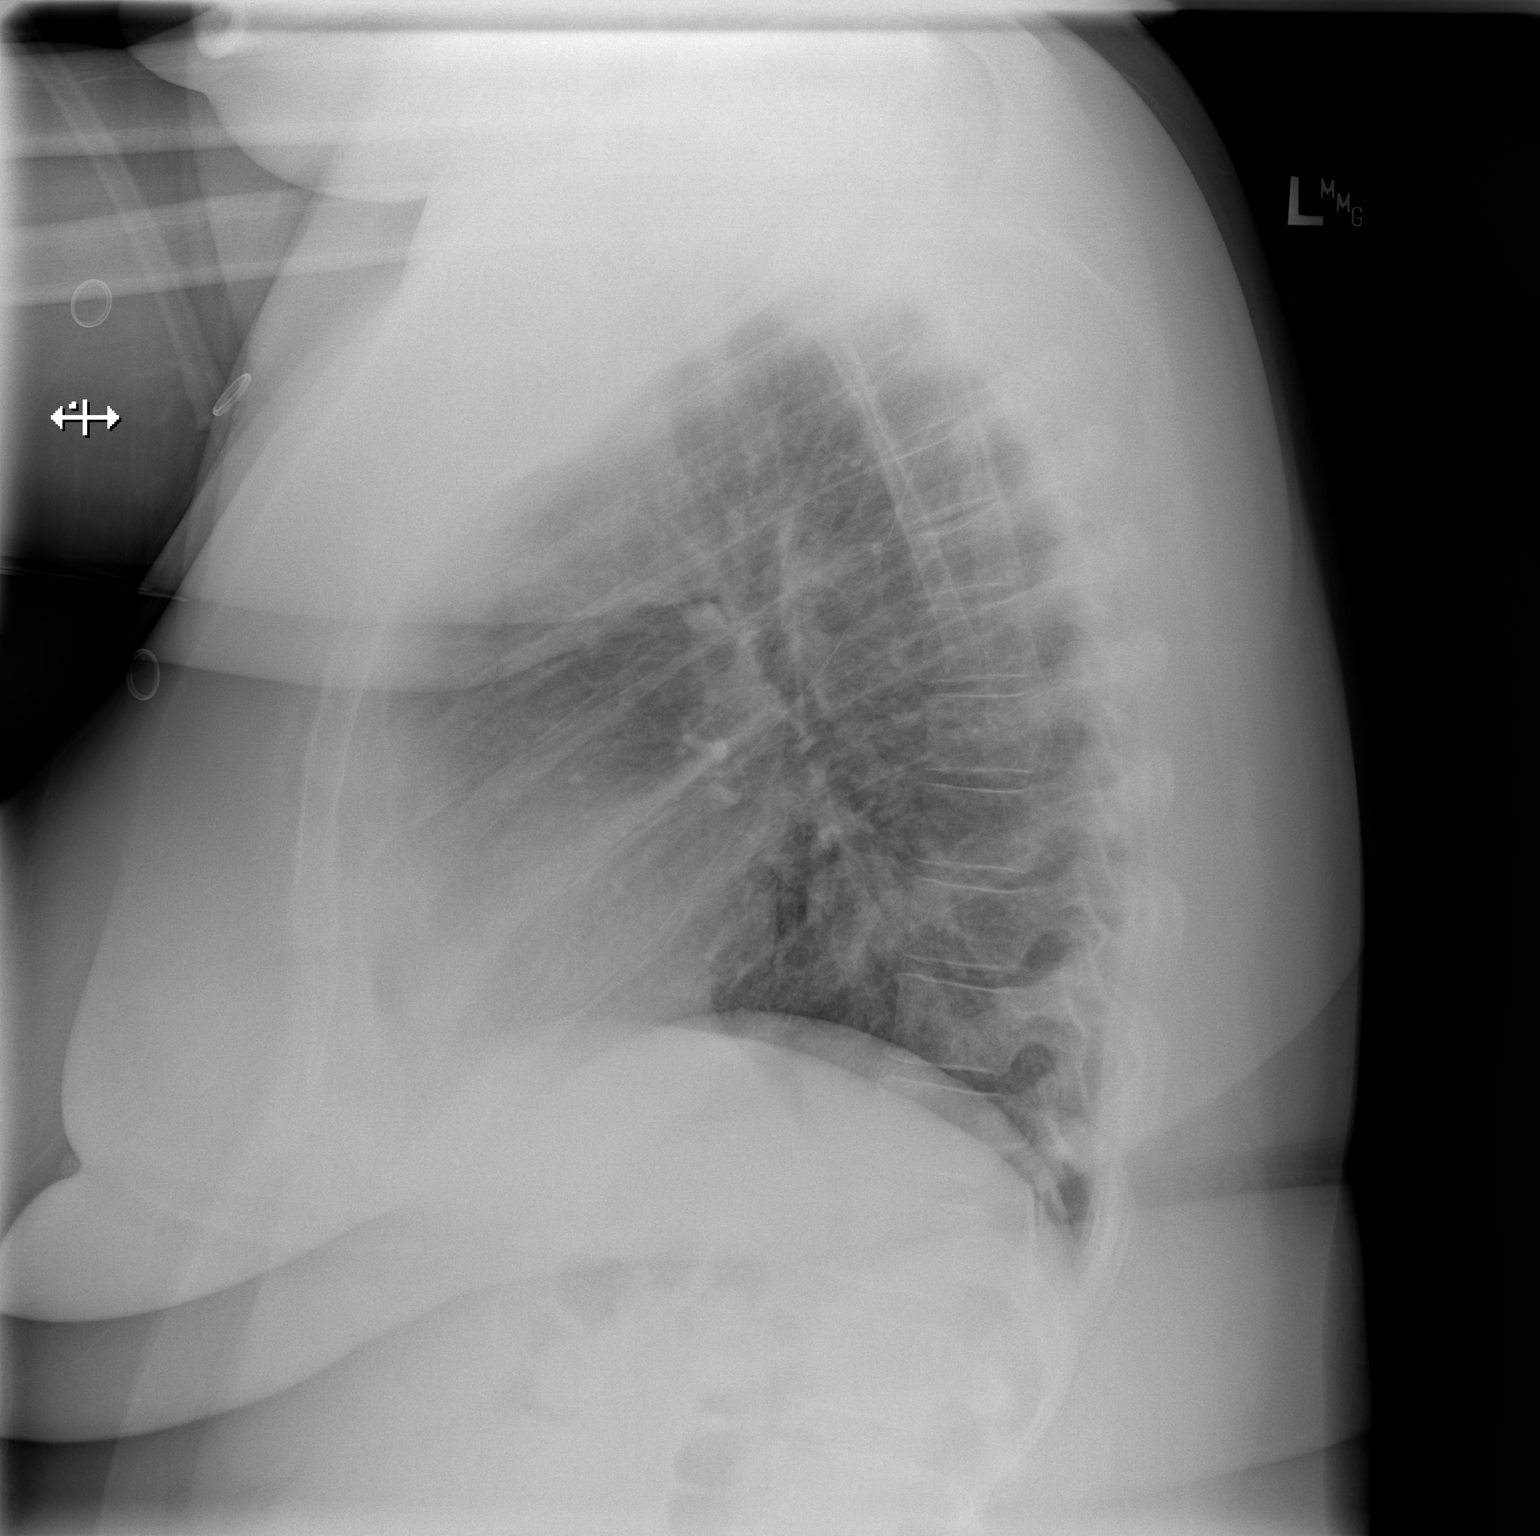

[2 of 2 positions shown; findings below may reference images not displayed]

FINDINGS: The heart size and mediastinal contours are within normal limits.
Both lungs are clear. The visualized skeletal structures are
unremarkable.
IMPRESSION: No active cardiopulmonary disease.

## 2015-09-17 IMAGING — CT CT HEAD W/O CM
2 series · 16 of 30 positions shown, 20 images · non-contrast
Comparison: CT HEAD W/O CM dated 06/10/2012

CLINICAL DATA: Migraine, nausea, headache

EXAM:
CT HEAD WITHOUT CONTRAST
TECHNIQUE: Contiguous axial images were obtained from the base of the skull
through the vertex without intravenous contrast.

[Series 2: head w/o · axial · non-contrast · 0.48mm/px · z∈[-166,-46]mm · 13 of 30 slices shown, 17 images]
[im 3/30  brain]
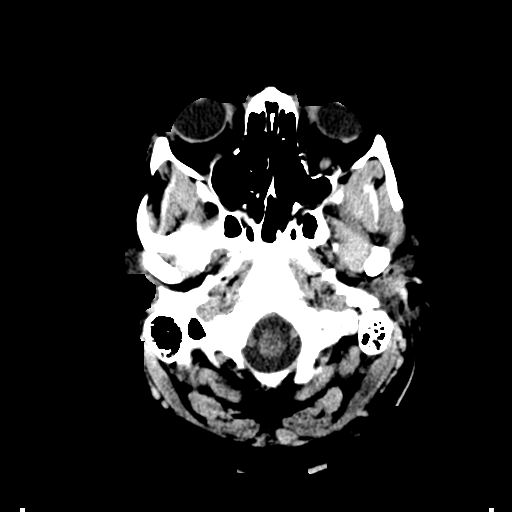
[im 3/30  bone]
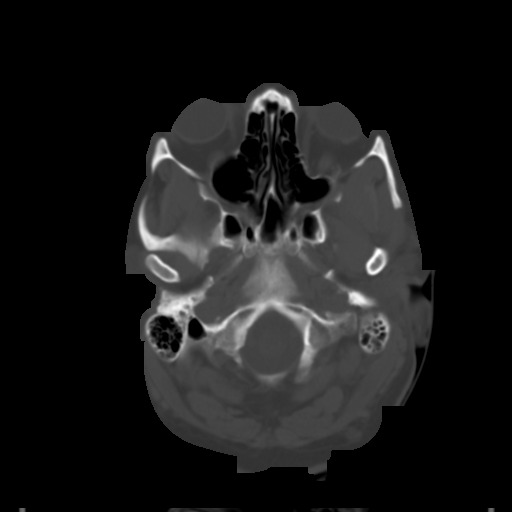
[im 5/30  brain]
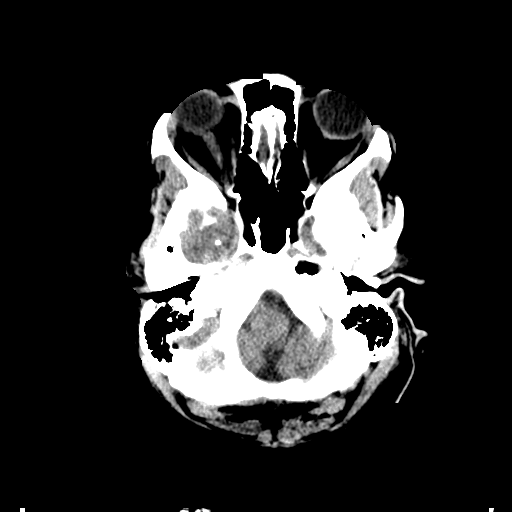
[im 7/30  brain]
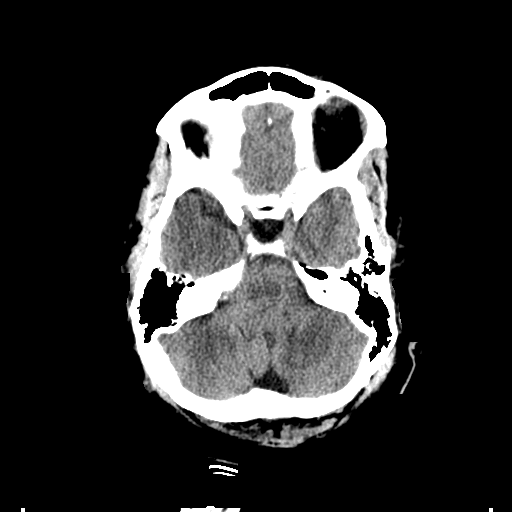
[im 9/30  brain]
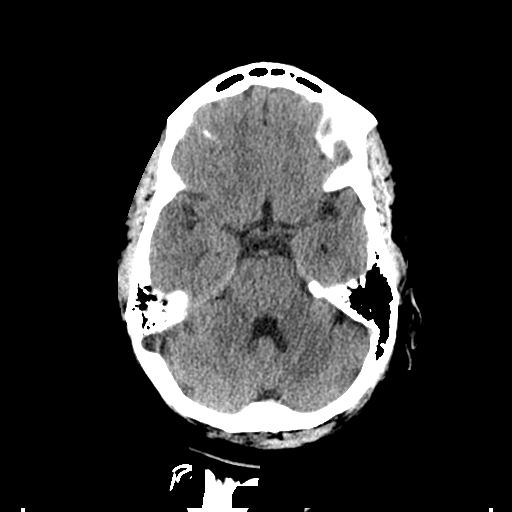
[im 11/30  brain]
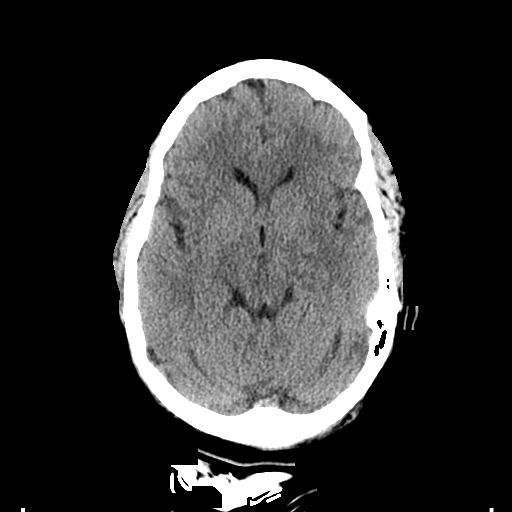
[im 11/30  bone]
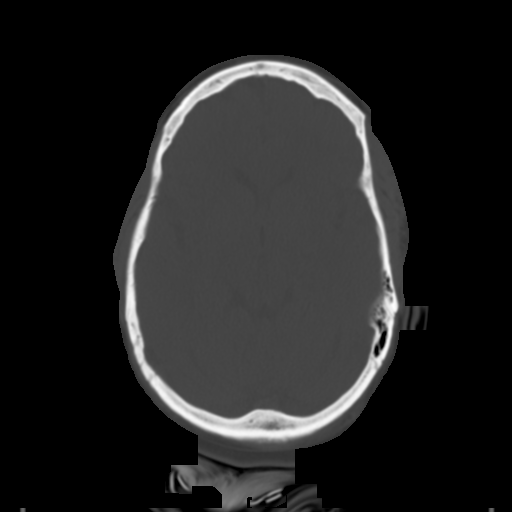
[im 13/30  brain]
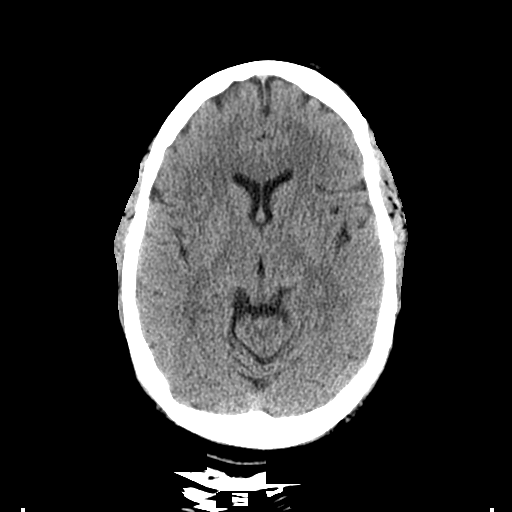
[im 15/30  brain]
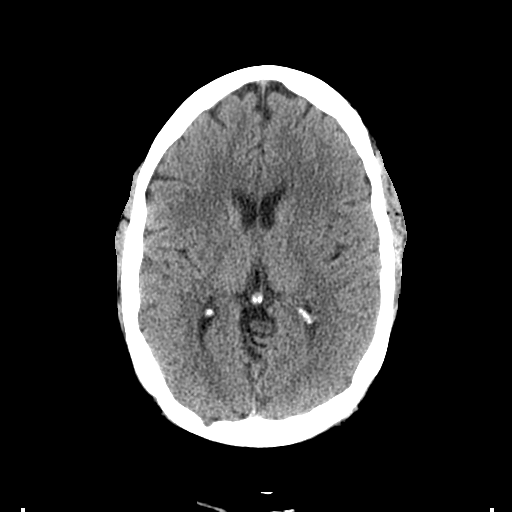
[im 17/30  brain]
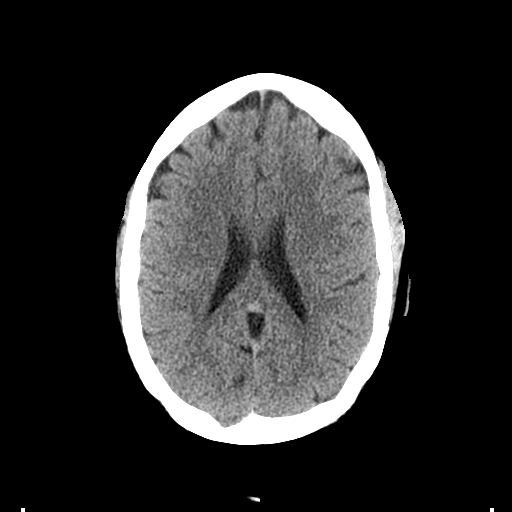
[im 19/30  brain]
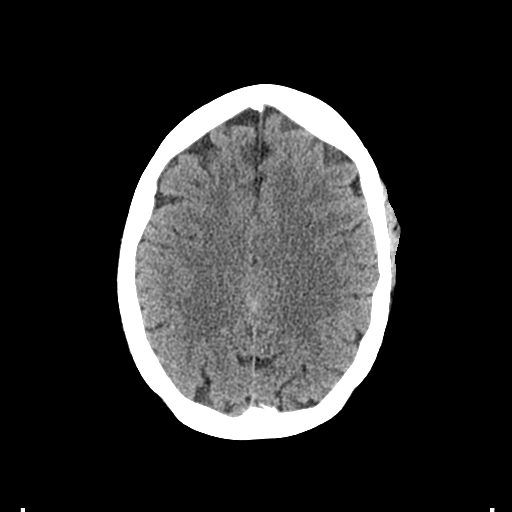
[im 19/30  bone]
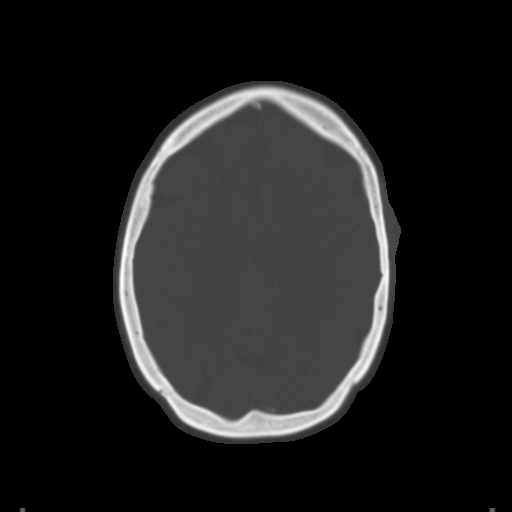
[im 21/30  brain]
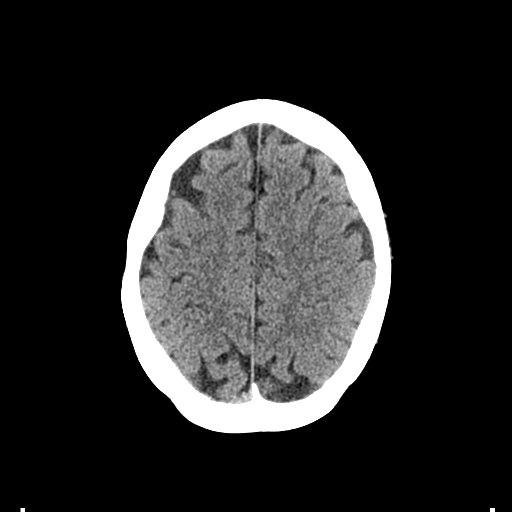
[im 23/30  brain]
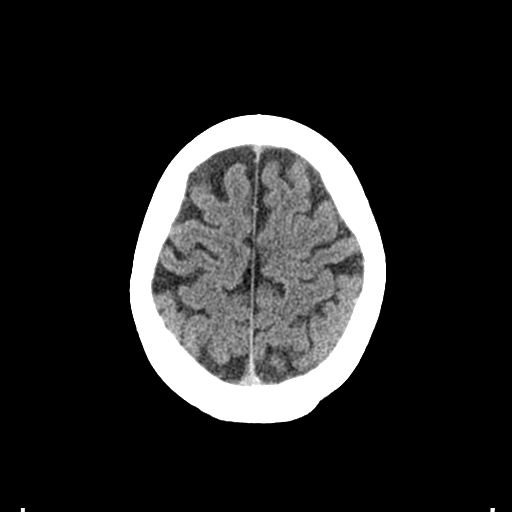
[im 25/30  brain]
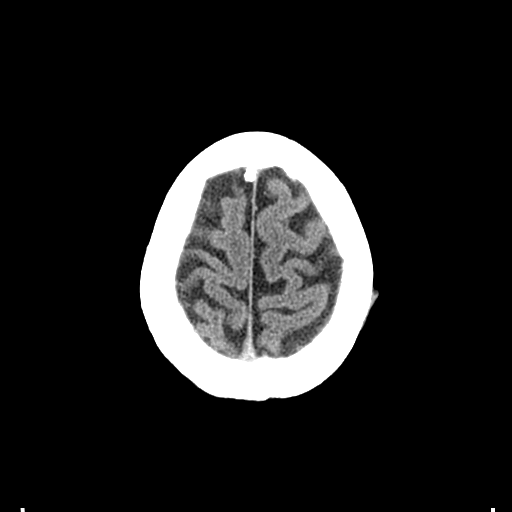
[im 27/30  brain]
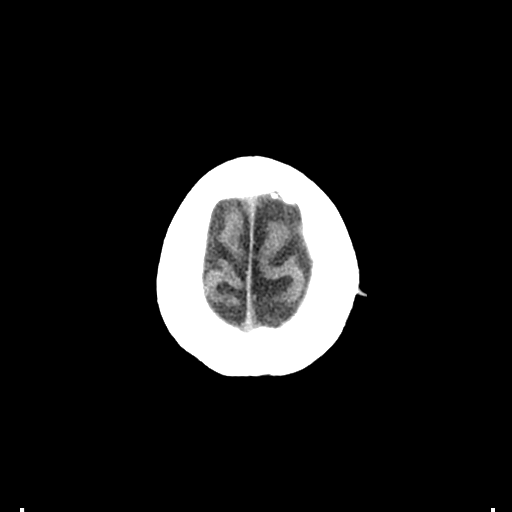
[im 27/30  bone]
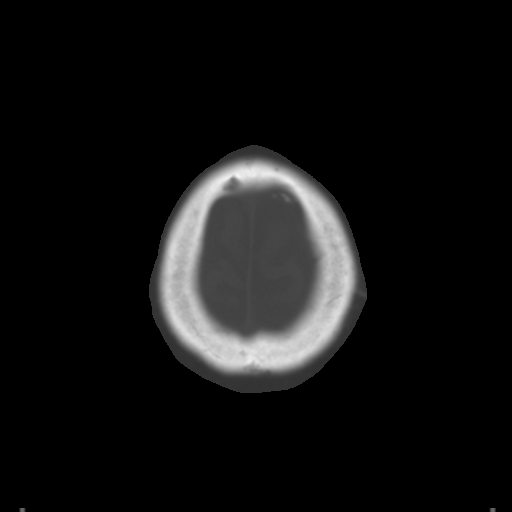

[Series 3: bone windows · axial · 0.48mm/px · z∈[-166,-126]mm · 3 of 30 slices shown]
[im 3/30  bone]
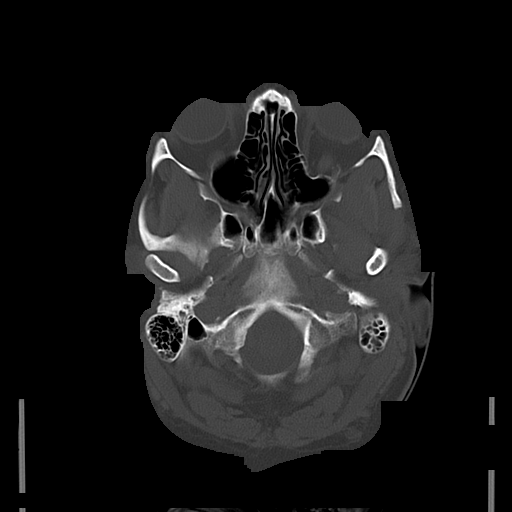
[im 7/30  bone]
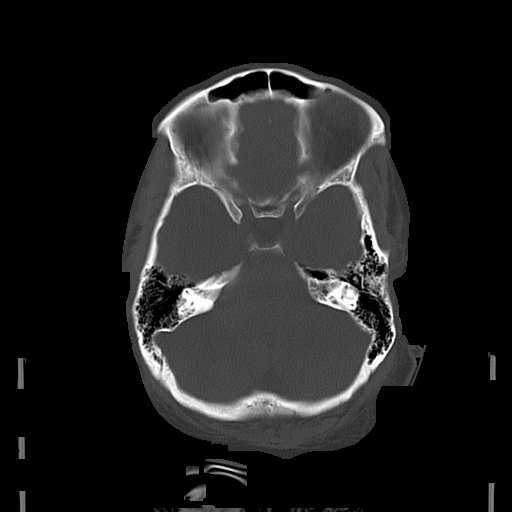
[im 11/30  bone]
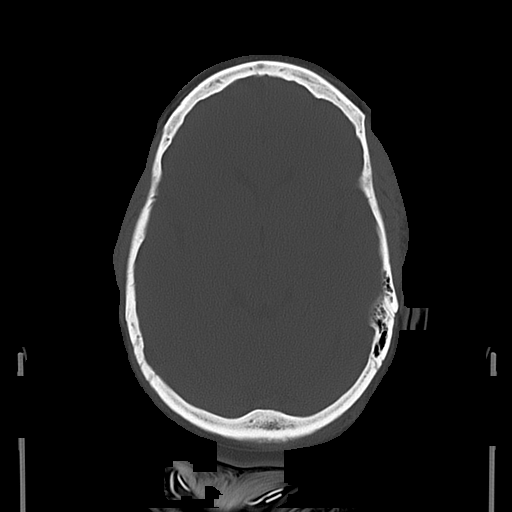

[16 of 30 positions shown; findings below may reference images not displayed]

FINDINGS: No acute intracranial hemorrhage. No focal mass lesion. No CT
evidence of acute infarction. No midline shift or mass effect. No
hydrocephalus. Basilar cisterns are patent.

Mild periventricular white matter hypodensities.

Remote micro plate fixation of the inferior left orbit. Paranasal
sinuses and mastoid air cells are clear.
IMPRESSION: No acute intracranial findings.

## 2015-10-15 ENCOUNTER — Ambulatory Visit: Payer: Medicaid Other | Attending: Internal Medicine
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
## 2016-01-10 ENCOUNTER — Ambulatory Visit: Payer: Medicaid Other | Admitting: Family Medicine

## 2016-01-14 ENCOUNTER — Emergency Department (HOSPITAL_COMMUNITY): Payer: Medicaid Other

## 2016-01-14 ENCOUNTER — Emergency Department (HOSPITAL_COMMUNITY)
Admission: EM | Admit: 2016-01-14 | Discharge: 2016-01-14 | Disposition: A | Discharge status: Home / Self Care | Payer: Medicaid Other | Attending: Emergency Medicine | Admitting: Emergency Medicine

## 2016-01-14 ENCOUNTER — Encounter (HOSPITAL_COMMUNITY): Payer: Self-pay

## 2016-01-14 DIAGNOSIS — I252 Old myocardial infarction: Secondary | ICD-10-CM | POA: Insufficient documentation

## 2016-01-14 DIAGNOSIS — S9031XA Contusion of right foot, initial encounter: Secondary | ICD-10-CM | POA: Insufficient documentation

## 2016-01-14 DIAGNOSIS — Z87891 Personal history of nicotine dependence: Secondary | ICD-10-CM | POA: Insufficient documentation

## 2016-01-14 DIAGNOSIS — Y929 Unspecified place or not applicable: Secondary | ICD-10-CM | POA: Insufficient documentation

## 2016-01-14 DIAGNOSIS — Y939 Activity, unspecified: Secondary | ICD-10-CM | POA: Insufficient documentation

## 2016-01-14 DIAGNOSIS — W208XXA Other cause of strike by thrown, projected or falling object, initial encounter: Secondary | ICD-10-CM | POA: Insufficient documentation

## 2016-01-14 DIAGNOSIS — Y999 Unspecified external cause status: Secondary | ICD-10-CM | POA: Insufficient documentation

## 2016-01-14 LAB — D-DIMER, QUANTITATIVE: D-Dimer, Quant: <0.27 ug{FEU}/mL (ref 0.00–0.50)

## 2016-01-14 NOTE — ED Notes (Signed)
Pt reports she hurt her foot and leg about a week ago. Pain not gone away and she is concerned.

## 2016-01-14 NOTE — ED Notes (Signed)
Pt transported to xray 
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
## 2016-01-14 NOTE — ED Triage Notes (Signed)
Pt reports she dropped a box on her foot 1 week ago. She also reports cramping and knots in the back of her left leg.

## 2016-01-14 NOTE — Discharge Instructions (Signed)
Please read attached information. If you experience any new or worsening signs or symptoms please return to the emergency room for evaluation. Please follow-up with your primary care provider or specialist as discussed.  °

## 2016-01-14 NOTE — ED Notes (Signed)
PA at bedside.

## 2016-06-27 ENCOUNTER — Encounter (HOSPITAL_COMMUNITY): Payer: Self-pay | Admitting: Emergency Medicine

## 2016-06-27 ENCOUNTER — Emergency Department (HOSPITAL_COMMUNITY): Payer: Self-pay

## 2016-06-27 ENCOUNTER — Emergency Department (HOSPITAL_COMMUNITY)
Admission: EM | Admit: 2016-06-27 | Discharge: 2016-06-27 | Disposition: A | Discharge status: Home / Self Care | Payer: Self-pay | Attending: Emergency Medicine | Admitting: Emergency Medicine

## 2016-06-27 DIAGNOSIS — R1012 Left upper quadrant pain: Secondary | ICD-10-CM | POA: Insufficient documentation

## 2016-06-27 DIAGNOSIS — Z79899 Other long term (current) drug therapy: Secondary | ICD-10-CM | POA: Insufficient documentation

## 2016-06-27 DIAGNOSIS — Z859 Personal history of malignant neoplasm, unspecified: Secondary | ICD-10-CM | POA: Insufficient documentation

## 2016-06-27 DIAGNOSIS — Z87891 Personal history of nicotine dependence: Secondary | ICD-10-CM | POA: Insufficient documentation

## 2016-06-27 DIAGNOSIS — R197 Diarrhea, unspecified: Secondary | ICD-10-CM | POA: Insufficient documentation

## 2016-06-27 DIAGNOSIS — I252 Old myocardial infarction: Secondary | ICD-10-CM | POA: Insufficient documentation

## 2016-06-27 HISTORY — DX: Malignant (primary) neoplasm, unspecified: C80.1

## 2016-06-27 LAB — CBC WITH DIFFERENTIAL/PLATELET
BASOS ABS: 0 10*3/uL (ref 0.0–0.1)
Basophils Relative: 1 %
Eosinophils Absolute: 0.2 10*3/uL (ref 0.0–0.7)
Eosinophils Relative: 4 %
HCT: 36.2 % (ref 36.0–46.0)
HEMOGLOBIN: 11.8 g/dL — AB (ref 12.0–15.0)
LYMPHS ABS: 1.7 10*3/uL (ref 0.7–4.0)
LYMPHS PCT: 41 %
MCH: 28.5 pg (ref 26.0–34.0)
MCHC: 32.6 g/dL (ref 30.0–36.0)
MCV: 87.4 fL (ref 78.0–100.0)
Monocytes Absolute: 0.3 10*3/uL (ref 0.1–1.0)
Monocytes Relative: 7 %
NEUTROS PCT: 47 %
Neutro Abs: 2 10*3/uL (ref 1.7–7.7)
Platelets: 231 10*3/uL (ref 150–400)
RBC: 4.14 MIL/uL (ref 3.87–5.11)
RDW: 12.8 % (ref 11.5–15.5)
WBC: 4.2 10*3/uL (ref 4.0–10.5)

## 2016-06-27 LAB — URINALYSIS, ROUTINE W REFLEX MICROSCOPIC
Bilirubin Urine: NEGATIVE
Glucose, UA: NEGATIVE mg/dL
HGB URINE DIPSTICK: NEGATIVE
Ketones, ur: NEGATIVE mg/dL
LEUKOCYTES UA: NEGATIVE
Nitrite: NEGATIVE
PROTEIN: NEGATIVE mg/dL
pH: 6 (ref 5.0–8.0)

## 2016-06-27 LAB — COMPREHENSIVE METABOLIC PANEL
ALT: 13 U/L — ABNORMAL LOW (ref 14–54)
ANION GAP: 8 (ref 5–15)
AST: 19 U/L (ref 15–41)
Albumin: 3.6 g/dL (ref 3.5–5.0)
Alkaline Phosphatase: 60 U/L (ref 38–126)
BILIRUBIN TOTAL: 0.5 mg/dL (ref 0.3–1.2)
BUN: 9 mg/dL (ref 6–20)
CHLORIDE: 106 mmol/L (ref 101–111)
CO2: 23 mmol/L (ref 22–32)
Calcium: 8.5 mg/dL — ABNORMAL LOW (ref 8.9–10.3)
Creatinine, Ser: 0.8 mg/dL (ref 0.44–1.00)
Glucose, Bld: 100 mg/dL — ABNORMAL HIGH (ref 65–99)
POTASSIUM: 3.7 mmol/L (ref 3.5–5.1)
Sodium: 137 mmol/L (ref 135–145)
TOTAL PROTEIN: 7.1 g/dL (ref 6.5–8.1)

## 2016-06-27 LAB — LIPASE, BLOOD: Lipase: 19 U/L (ref 11–51)

## 2016-06-27 MED ORDER — MORPHINE SULFATE (PF) 4 MG/ML IV SOLN
6.0000 mg | Freq: Once | INTRAVENOUS | Status: AC
  Administered 2016-06-27: 6 mg via INTRAVENOUS
  Filled 2016-06-27: qty 2

## 2016-06-27 MED ORDER — DICYCLOMINE HCL 20 MG PO TABS: 20.0000 mg | ORAL_TABLET | Freq: Two times a day (BID) | ORAL | 0 refills | Status: DC

## 2016-06-27 MED ORDER — ONDANSETRON HCL 4 MG/2ML IJ SOLN
4.0000 mg | Freq: Once | INTRAMUSCULAR | Status: AC
  Administered 2016-06-27: 4 mg via INTRAVENOUS
  Filled 2016-06-27: qty 2

## 2016-06-27 MED ORDER — IOPAMIDOL (ISOVUE-300) INJECTION 61%
INTRAVENOUS | Status: AC
  Administered 2016-06-27: 100 mL
  Filled 2016-06-27: qty 100

## 2016-06-27 NOTE — ED Triage Notes (Addendum)
Pt here for multiple complaints- Pt reports three weeks of left sided abdominal pain. PT then reports several days of right neck pain. Pt now reports 2 days of lower back pain. Pt reports the pain keeps changing locations throughout her upper body. Pt also reports she started vomiting yesterday but also vomited three weeks ago. No emesis today, but does have nausea. Pt also reports diarrhea for two days.  Denies urinary symptoms.
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
## 2016-06-27 NOTE — ED Notes (Signed)
Got patient into a gown patient is waiting on provider
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
## 2016-09-14 ENCOUNTER — Encounter (HOSPITAL_COMMUNITY): Payer: Self-pay | Admitting: Emergency Medicine

## 2016-09-14 ENCOUNTER — Emergency Department (HOSPITAL_COMMUNITY): Payer: Self-pay

## 2016-09-14 ENCOUNTER — Emergency Department (HOSPITAL_COMMUNITY)
Admission: EM | Admit: 2016-09-14 | Discharge: 2016-09-14 | Disposition: A | Discharge status: Home / Self Care | Payer: Self-pay | Attending: Emergency Medicine | Admitting: Emergency Medicine

## 2016-09-14 DIAGNOSIS — Z859 Personal history of malignant neoplasm, unspecified: Secondary | ICD-10-CM | POA: Insufficient documentation

## 2016-09-14 DIAGNOSIS — R55 Syncope and collapse: Secondary | ICD-10-CM | POA: Insufficient documentation

## 2016-09-14 DIAGNOSIS — Z87891 Personal history of nicotine dependence: Secondary | ICD-10-CM | POA: Insufficient documentation

## 2016-09-14 DIAGNOSIS — R200 Anesthesia of skin: Secondary | ICD-10-CM | POA: Insufficient documentation

## 2016-09-14 DIAGNOSIS — Z79899 Other long term (current) drug therapy: Secondary | ICD-10-CM | POA: Insufficient documentation

## 2016-09-14 DIAGNOSIS — R42 Dizziness and giddiness: Secondary | ICD-10-CM | POA: Insufficient documentation

## 2016-09-14 DIAGNOSIS — M542 Cervicalgia: Secondary | ICD-10-CM | POA: Insufficient documentation

## 2016-09-14 DIAGNOSIS — Y92410 Unspecified street and highway as the place of occurrence of the external cause: Secondary | ICD-10-CM | POA: Insufficient documentation

## 2016-09-14 DIAGNOSIS — Y999 Unspecified external cause status: Secondary | ICD-10-CM | POA: Insufficient documentation

## 2016-09-14 DIAGNOSIS — R51 Headache: Secondary | ICD-10-CM | POA: Insufficient documentation

## 2016-09-14 DIAGNOSIS — Y939 Activity, unspecified: Secondary | ICD-10-CM | POA: Insufficient documentation

## 2016-09-14 DIAGNOSIS — M25521 Pain in right elbow: Secondary | ICD-10-CM | POA: Insufficient documentation

## 2016-09-14 DIAGNOSIS — M25511 Pain in right shoulder: Secondary | ICD-10-CM | POA: Insufficient documentation

## 2016-09-14 DIAGNOSIS — R0789 Other chest pain: Secondary | ICD-10-CM | POA: Insufficient documentation

## 2016-09-14 DIAGNOSIS — M549 Dorsalgia, unspecified: Secondary | ICD-10-CM | POA: Insufficient documentation

## 2016-09-14 MED ORDER — ACETAMINOPHEN 500 MG PO TABS: 500.0000 mg | ORAL_TABLET | Freq: Four times a day (QID) | ORAL | 0 refills | Status: AC | PRN

## 2016-09-14 MED ORDER — HYDROCODONE-ACETAMINOPHEN 5-325 MG PO TABS
2.0000 | ORAL_TABLET | Freq: Once | ORAL | Status: AC
  Administered 2016-09-14: 2 via ORAL
  Filled 2016-09-14: qty 2

## 2016-09-14 MED ORDER — IBUPROFEN 800 MG PO TABS: 800.0000 mg | ORAL_TABLET | Freq: Three times a day (TID) | ORAL | 0 refills | Status: DC

## 2016-09-14 MED ORDER — METHOCARBAMOL 500 MG PO TABS: 500.0000 mg | ORAL_TABLET | Freq: Two times a day (BID) | ORAL | 0 refills | Status: DC
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
## 2016-10-09 ENCOUNTER — Encounter (HOSPITAL_COMMUNITY): Payer: Self-pay | Admitting: Emergency Medicine

## 2016-10-09 ENCOUNTER — Emergency Department (HOSPITAL_COMMUNITY)
Admission: EM | Admit: 2016-10-09 | Discharge: 2016-10-09 | Disposition: A | Discharge status: Home / Self Care | Payer: Medicaid Other | Attending: Emergency Medicine | Admitting: Emergency Medicine

## 2016-10-09 DIAGNOSIS — K0889 Other specified disorders of teeth and supporting structures: Secondary | ICD-10-CM

## 2016-10-09 DIAGNOSIS — K029 Dental caries, unspecified: Secondary | ICD-10-CM | POA: Insufficient documentation

## 2016-10-09 DIAGNOSIS — Z859 Personal history of malignant neoplasm, unspecified: Secondary | ICD-10-CM | POA: Insufficient documentation

## 2016-10-09 DIAGNOSIS — Z87891 Personal history of nicotine dependence: Secondary | ICD-10-CM | POA: Insufficient documentation

## 2016-10-09 DIAGNOSIS — Z79899 Other long term (current) drug therapy: Secondary | ICD-10-CM | POA: Insufficient documentation

## 2016-10-09 DIAGNOSIS — I252 Old myocardial infarction: Secondary | ICD-10-CM | POA: Insufficient documentation

## 2016-10-09 DIAGNOSIS — H9202 Otalgia, left ear: Secondary | ICD-10-CM | POA: Insufficient documentation

## 2016-10-09 DIAGNOSIS — K047 Periapical abscess without sinus: Secondary | ICD-10-CM | POA: Insufficient documentation

## 2016-10-09 MED ORDER — OXYCODONE HCL 5 MG PO TABS
5.0000 mg | ORAL_TABLET | Freq: Once | ORAL | Status: AC
  Administered 2016-10-09: 5 mg via ORAL
  Filled 2016-10-09: qty 1

## 2016-10-09 MED ORDER — CLINDAMYCIN HCL 150 MG PO CAPS
450.0000 mg | ORAL_CAPSULE | Freq: Once | ORAL | Status: AC
  Administered 2016-10-09: 450 mg via ORAL
  Filled 2016-10-09: qty 3

## 2016-10-09 MED ORDER — OXYCODONE-ACETAMINOPHEN 5-325 MG PO TABS
1.0000 | ORAL_TABLET | ORAL | Status: DC | PRN
  Administered 2016-10-09: 1 via ORAL

## 2016-10-09 MED ORDER — CLINDAMYCIN HCL 150 MG PO CAPS: 450.0000 mg | ORAL_CAPSULE | Freq: Three times a day (TID) | ORAL | 0 refills | Status: DC

## 2016-10-09 MED ORDER — OXYCODONE-ACETAMINOPHEN 5-325 MG PO TABS
ORAL_TABLET | ORAL | Status: AC
  Filled 2016-10-09: qty 1
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
## 2016-10-09 NOTE — Discharge Instructions (Signed)
1. Medications: Clindamycin; alternate ibuprofen and tylenol for pain control, usual home medications 2. Treatment: rest, drink plenty of fluids, take medications as prescribed 3. Follow Up: Please followup with dentistry within 1 week for discussion of your diagnoses and further evaluation after today's visit; if you do not have a primary care doctor use the resource guide provided to find one; Return to the ER for high fevers, difficulty breathing, difficulty swallowing or other concerning symptoms

## 2016-10-09 NOTE — ED Triage Notes (Signed)
Pt presents with dental pain to L upper and lower jaw that radiates to the L face and ear and neck x 2-3 days; pt reports taking ibuprofen and muscle relaxers without relief

## 2016-10-17 IMAGING — CT CT ORBITS W/ CM
3 series · 12 of 47 positions shown, 14 images · IV contrast (omnipaque)
Comparison: CT of the head performed 04/15/2013

CLINICAL DATA: Acute onset of left eye pain and swelling for 1
week, recently worsening. Initial encounter.

EXAM:
CT ORBITS WITH CONTRAST
TECHNIQUE: Multidetector CT imaging of the orbits was performed following the
bolus administration of intravenous contrast.
CONTRAST:  75mL OMNIPAQUE IOHEXOL 300 MG/ML  SOLN

[Series 2: facial/ orbits 2.0 h30s · axial · 0.36mm/px · z∈[-145,-71]mm · 6 of 47 slices shown, 8 images]
[im 5/47  brain]
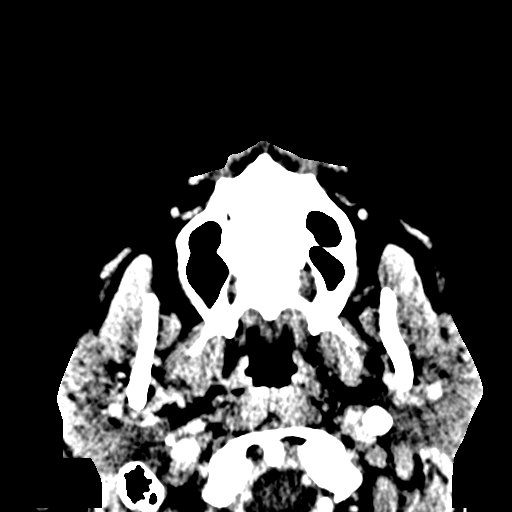
[im 5/47  bone]
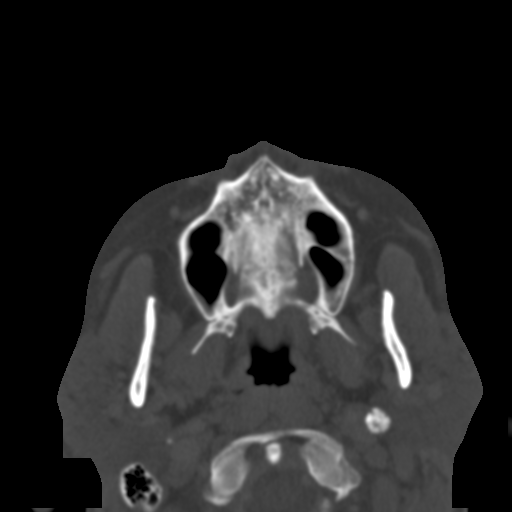
[im 13/47  bone]
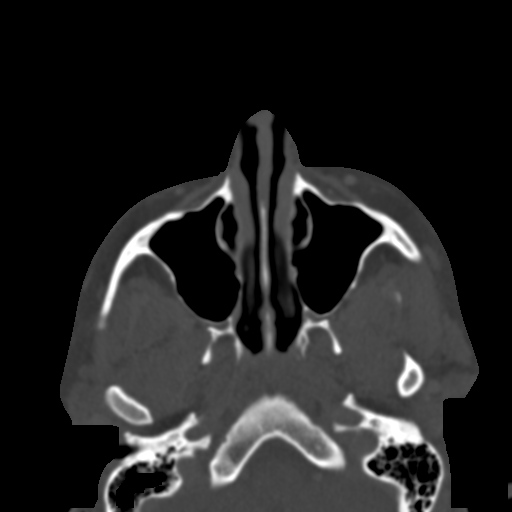
[im 20/47  bone]
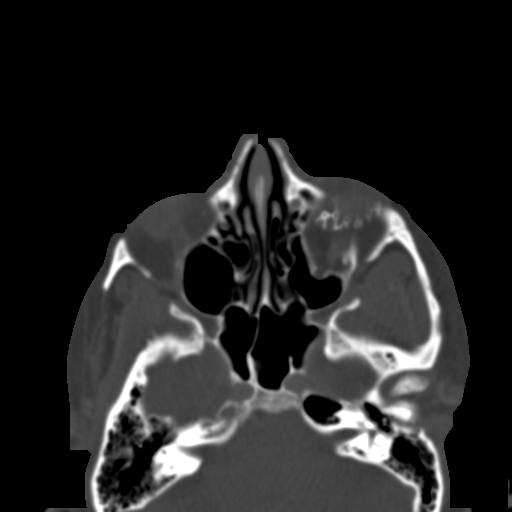
[im 27/47  bone]
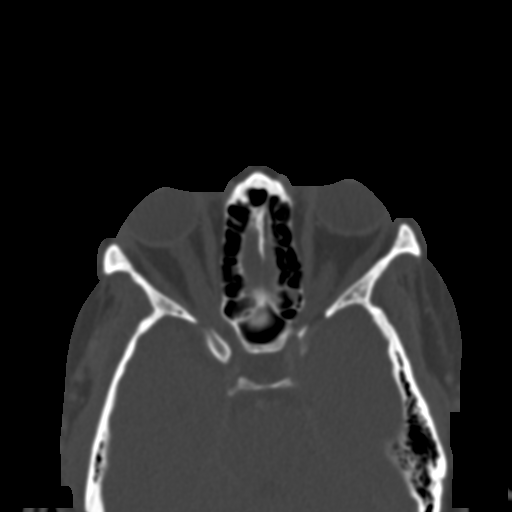
[im 35/47  brain]
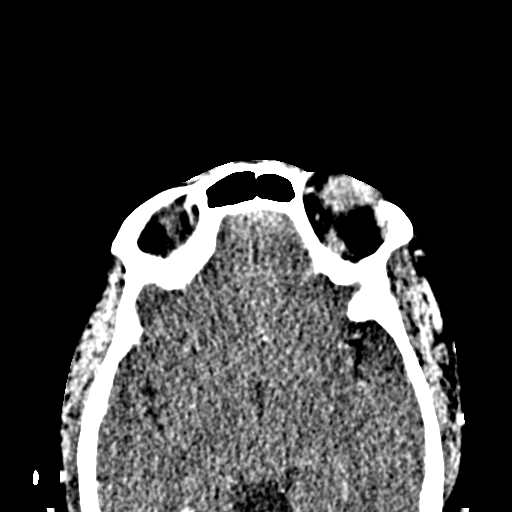
[im 35/47  bone]
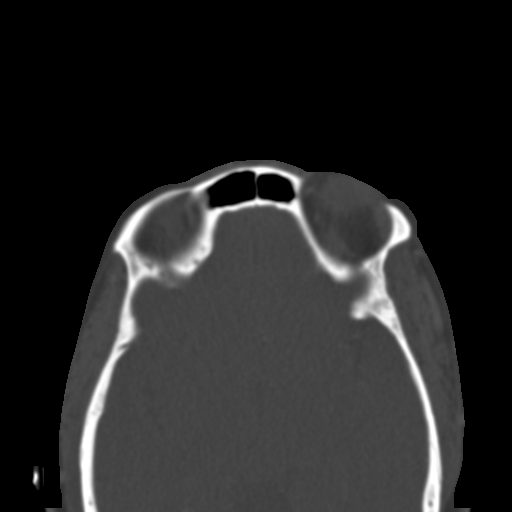
[im 42/47  bone]
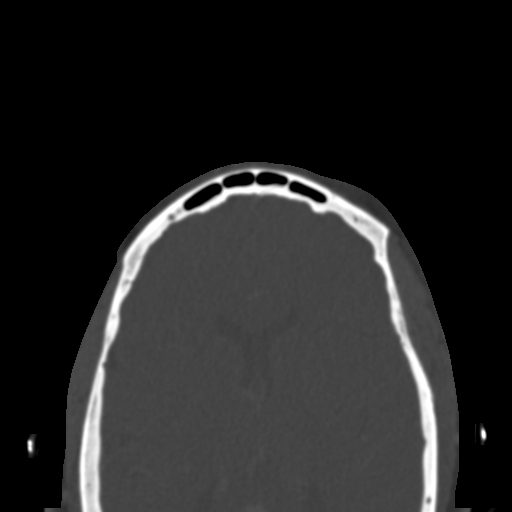

[Series 4: coronal st · coronal · 0.19mm/px · 3 of 71 slices shown]
[im 24/71  bone]
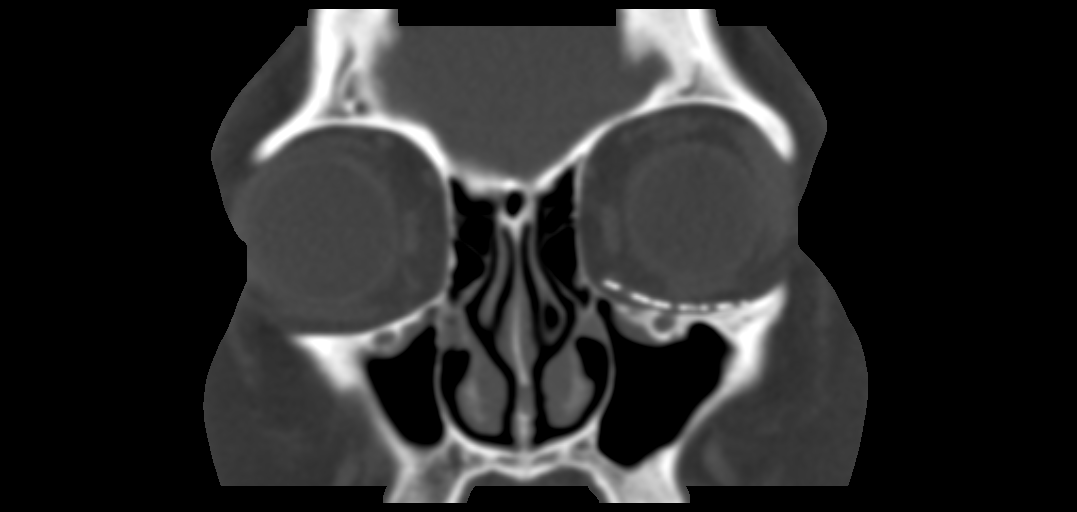
[im 32/71  bone]
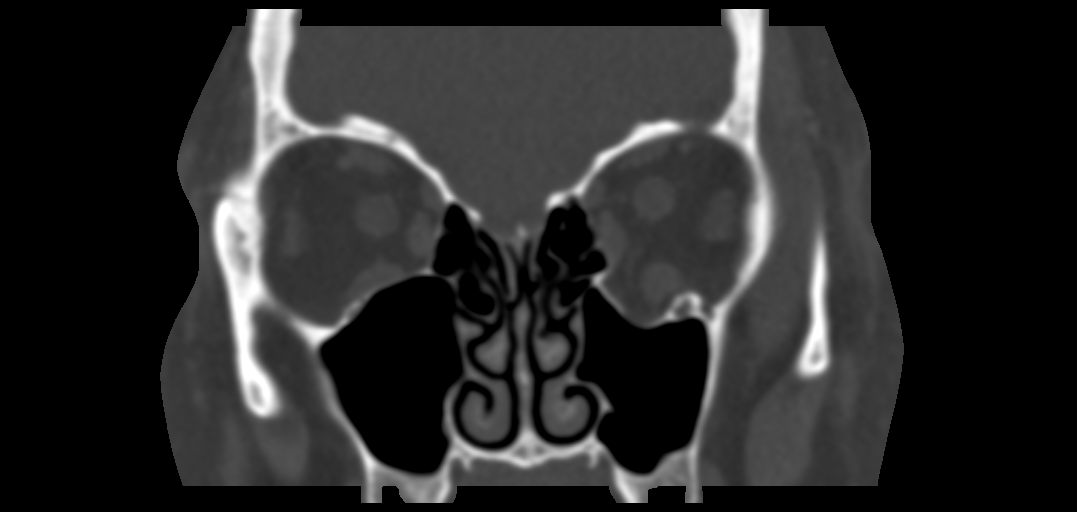
[im 39/71  bone]
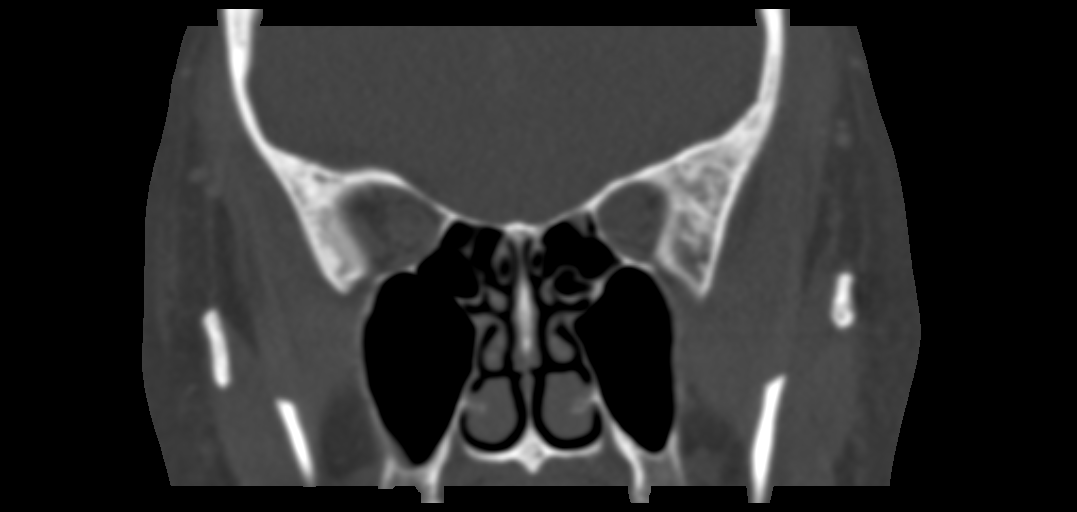

[Series 5: sagittal st · sagittal · 0.18mm/px · 3 of 79 slices shown]
[im 27/79  bone]
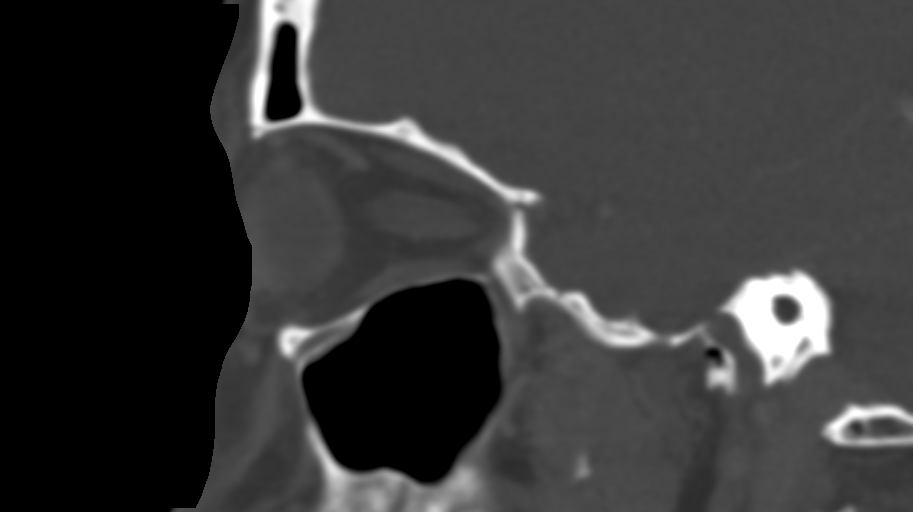
[im 40/79  bone]
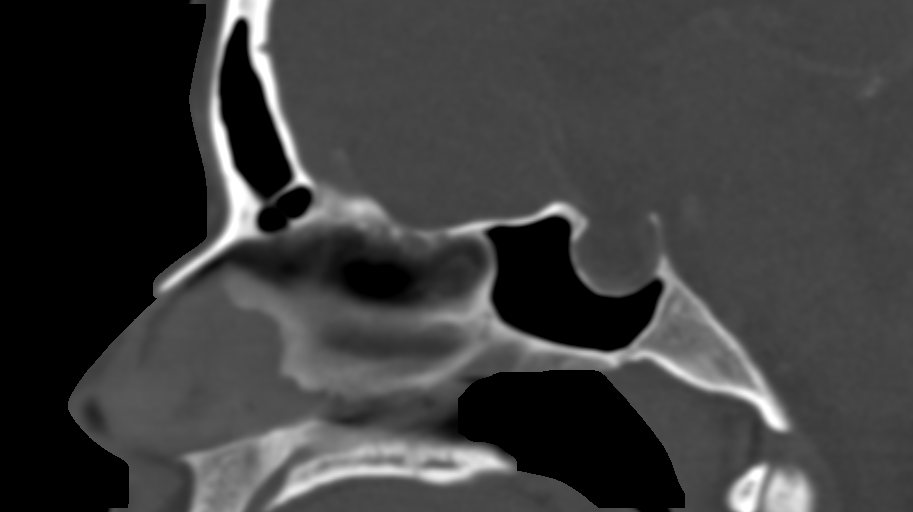
[im 53/79  bone]
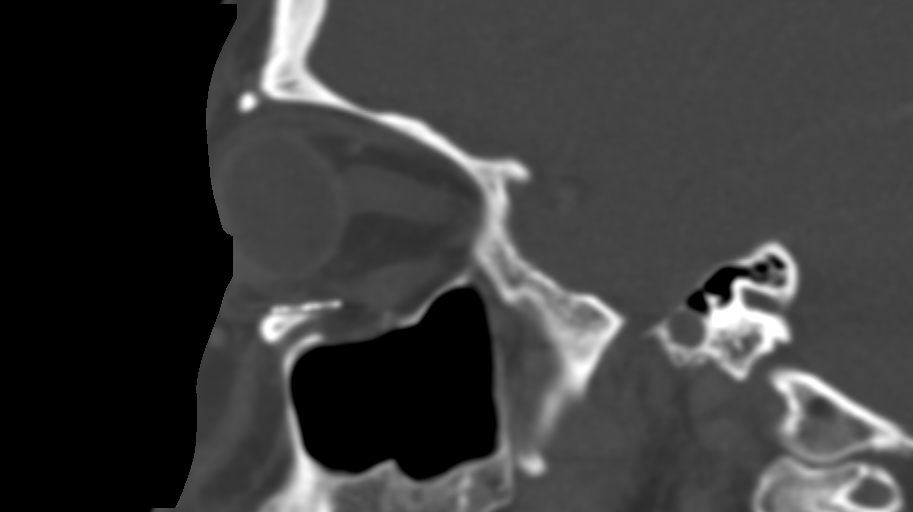

[12 of 47 positions shown; findings below may reference images not displayed]

FINDINGS: There is mildly asymmetric prominence of the soft tissues anterior
to the left orbit, raising suspicion for mild preseptal cellulitis.
There is no evidence of intraorbital involvement. The orbits are
otherwise symmetric. The extraocular musculature is within normal
limits. The optic globes are unremarkable in appearance.

The visualized portions of the brain are unremarkable in appearance.
Postoperative change is seen at the left orbital floor, with mild
underlying cortical irregularity.
IMPRESSION: Mildly asymmetric prominence of the soft tissues anterior to the
left orbit, concerning for mild preseptal cellulitis given the
patient's symptoms. No evidence of intraorbital involvement. The
orbits are otherwise symmetric and unremarkable in appearance.

## 2017-01-23 IMAGING — CT CT RENAL STONE PROTOCOL
2 of 4 series · 17 of 46 positions shown, 19 images · non-contrast
Comparison: 07/09/2008

CLINICAL DATA: Left-sided abdominal pain. The patient reports
symptoms began after a spider bite 2 weeks ago.

EXAM:
CT ABDOMEN AND PELVIS WITHOUT CONTRAST
TECHNIQUE: Multidetector CT imaging of the abdomen and pelvis was performed
following the standard protocol without IV contrast.

[Series 2: stone study 5.0 i30f 1 · axial · 0.75mm/px · z∈[-498,-58]mm · 14 of 96 slices shown, 16 images]
[im 4/96  soft-tissue]
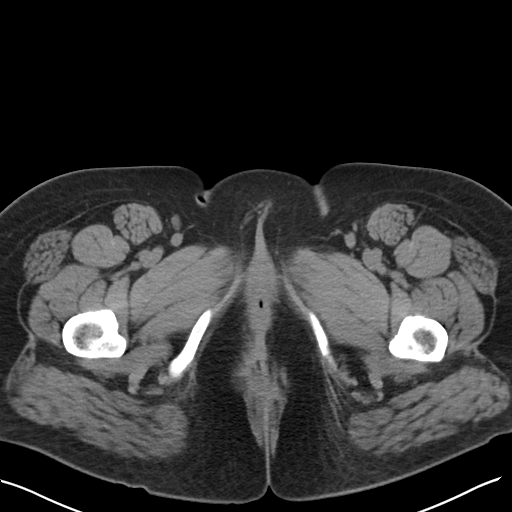
[im 4/96  bone]
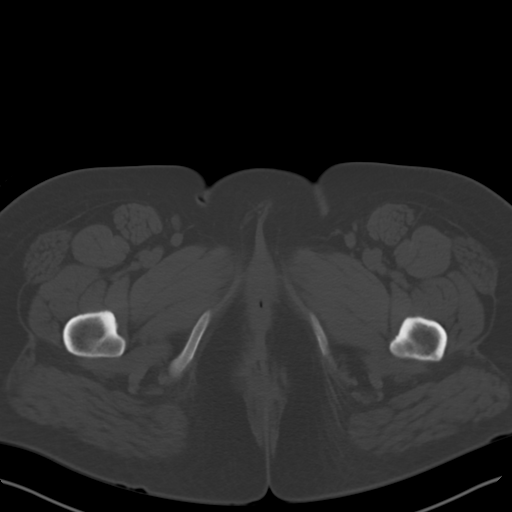
[im 12/96  soft-tissue]
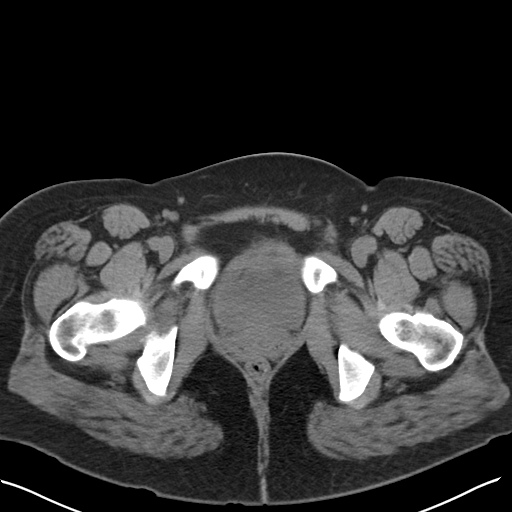
[im 20/96  soft-tissue]
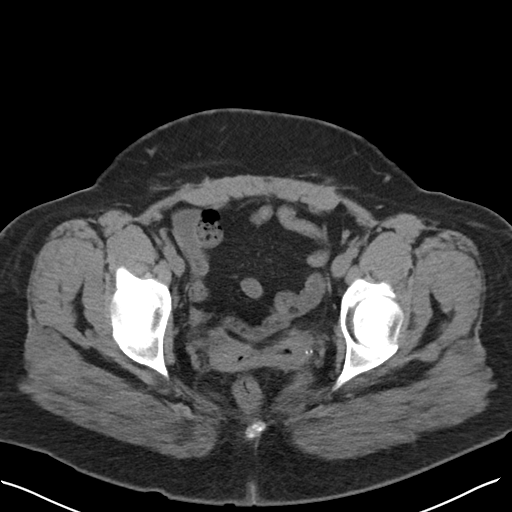
[im 24/96  soft-tissue]
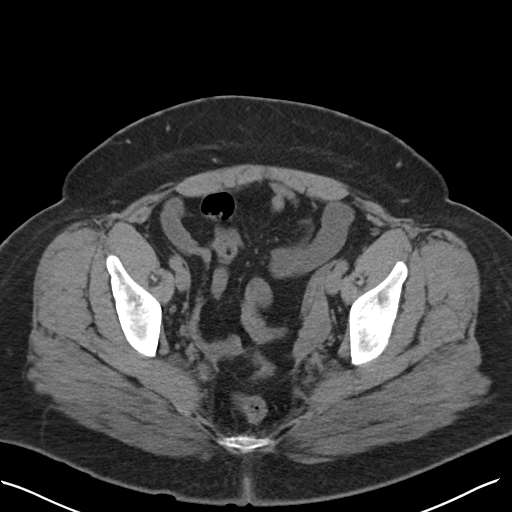
[im 32/96  soft-tissue]
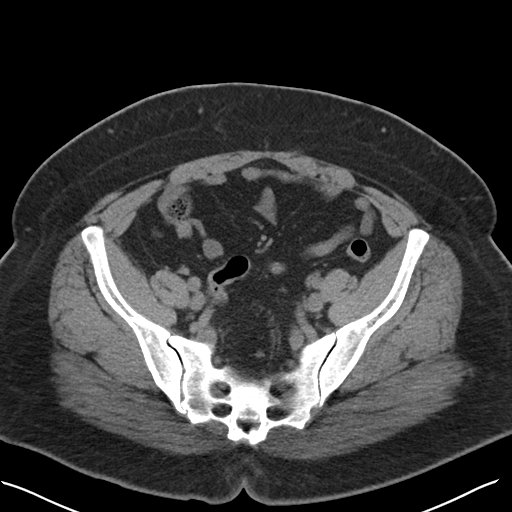
[im 40/96  soft-tissue]
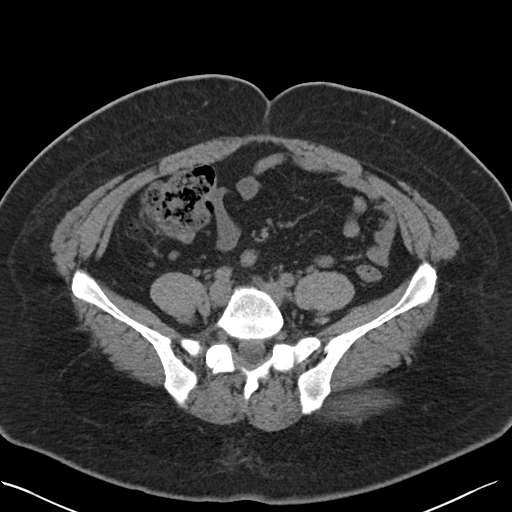
[im 44/96  soft-tissue]
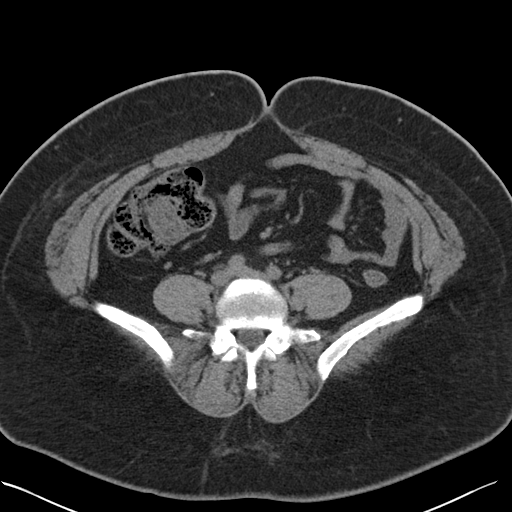
[im 52/96  soft-tissue]
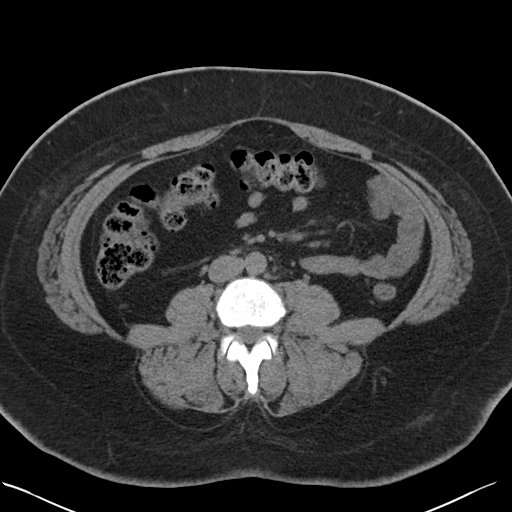
[im 56/96  soft-tissue]
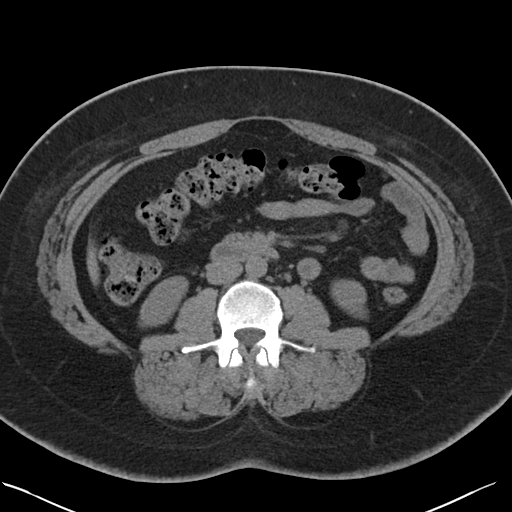
[im 56/96  bone]
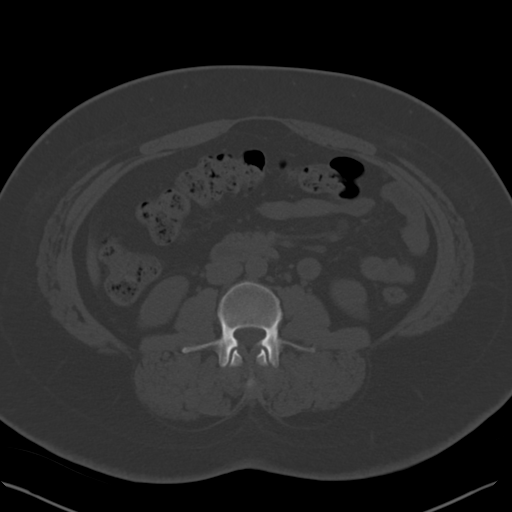
[im 64/96  soft-tissue]
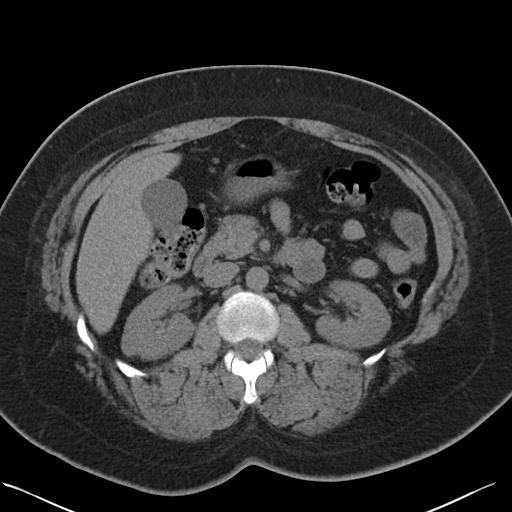
[im 72/96  soft-tissue]
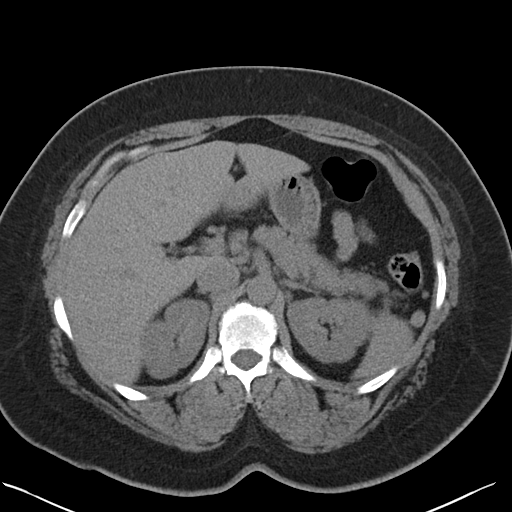
[im 76/96  soft-tissue]
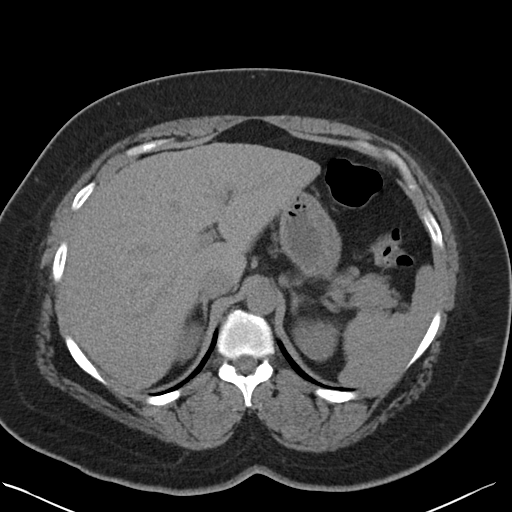
[im 84/96  soft-tissue]
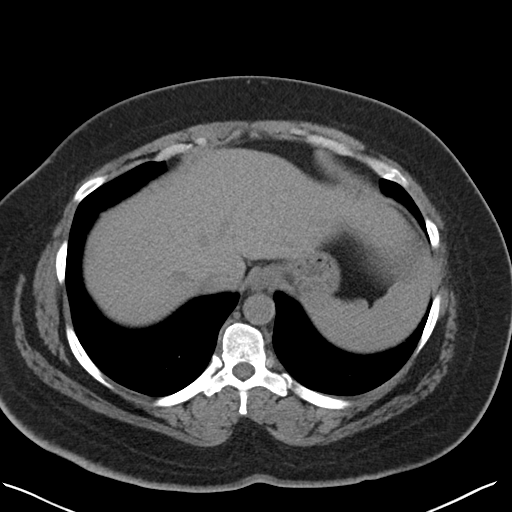
[im 92/96  soft-tissue]
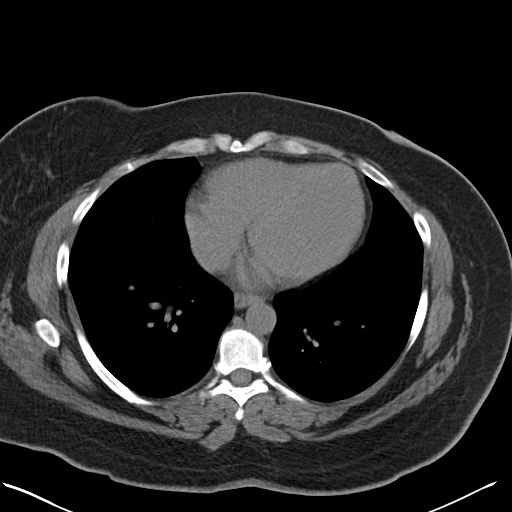

[Series 5: coronal soft tissue · coronal · 0.94mm/px · 3 of 93 slices shown]
[im 31/93  soft-tissue]
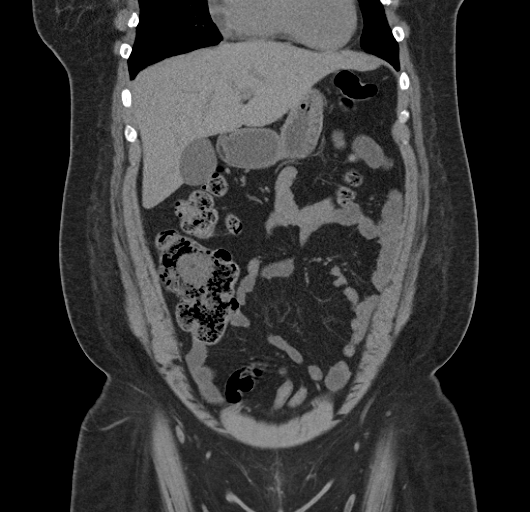
[im 41/93  soft-tissue]
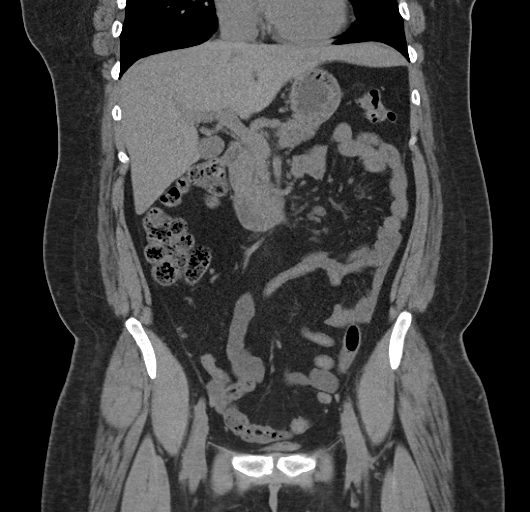
[im 52/93  soft-tissue]
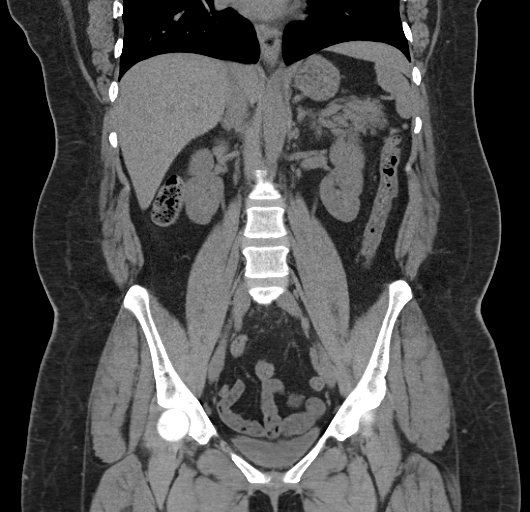

[17 of 46 positions shown; findings below may reference images not displayed]

FINDINGS: There are no urinary calculi. There is no hydronephrosis or ureteral
dilatation.

The abdominal aorta is normal in caliber without atherosclerotic
calcification.

No acute inflammatory changes are evident in the abdomen or pelvis.
There is no ascites. There is no adenopathy.

There are unremarkable unenhanced appearances of the liver,
gallbladder, bile ducts, pancreas, spleen, adrenals, and kidneys.

Bowel appears unremarkable.

There is prior hysterectomy.  Ovaries appear unremarkable.

There is no significant abnormality in the lower chest.

There is no significant musculoskeletal abnormality.
IMPRESSION: No acute findings.  No significant abnormality.

## 2017-03-18 ENCOUNTER — Other Ambulatory Visit: Payer: Self-pay

## 2017-03-18 ENCOUNTER — Encounter (HOSPITAL_COMMUNITY): Payer: Self-pay | Admitting: Emergency Medicine

## 2017-03-18 DIAGNOSIS — K122 Cellulitis and abscess of mouth: Secondary | ICD-10-CM | POA: Insufficient documentation

## 2017-03-18 DIAGNOSIS — R600 Localized edema: Secondary | ICD-10-CM | POA: Insufficient documentation

## 2017-03-18 DIAGNOSIS — K029 Dental caries, unspecified: Secondary | ICD-10-CM | POA: Insufficient documentation

## 2017-03-18 DIAGNOSIS — Z87891 Personal history of nicotine dependence: Secondary | ICD-10-CM | POA: Insufficient documentation

## 2017-03-18 DIAGNOSIS — Z79899 Other long term (current) drug therapy: Secondary | ICD-10-CM | POA: Insufficient documentation

## 2017-03-18 DIAGNOSIS — D509 Iron deficiency anemia, unspecified: Secondary | ICD-10-CM | POA: Insufficient documentation

## 2017-03-18 MED ORDER — OXYCODONE-ACETAMINOPHEN 5-325 MG PO TABS
1.0000 | ORAL_TABLET | Freq: Once | ORAL | Status: AC
  Administered 2017-03-18: 1 via ORAL
  Filled 2017-03-18: qty 1

## 2017-03-18 NOTE — ED Triage Notes (Signed)
Pt to ED with c/o dental pain x's 2 weeks.  Has been taking antibiotics without relief.  Also pt c/o abscess to roof of mouth and swelling to left side of face.   Pt st's unable to eat due to the pain

## 2017-03-19 ENCOUNTER — Emergency Department (HOSPITAL_COMMUNITY)
Admission: EM | Admit: 2017-03-19 | Discharge: 2017-03-19 | Disposition: A | Discharge status: Home / Self Care | Payer: Self-pay | Attending: Emergency Medicine | Admitting: Emergency Medicine

## 2017-03-19 ENCOUNTER — Emergency Department (HOSPITAL_COMMUNITY): Payer: Self-pay

## 2017-03-19 DIAGNOSIS — K0889 Other specified disorders of teeth and supporting structures: Secondary | ICD-10-CM

## 2017-03-19 DIAGNOSIS — K122 Cellulitis and abscess of mouth: Secondary | ICD-10-CM

## 2017-03-19 DIAGNOSIS — K029 Dental caries, unspecified: Secondary | ICD-10-CM

## 2017-03-19 IMAGING — DX DG SHOULDER 2+V*L*
3 series · 3 of 3 positions shown · non-contrast
Comparison: None

CLINICAL DATA: Pain for 10 days. Patient lifts heavy objects at
work

EXAM:
LEFT SHOULDER - 2+ VIEW

[shoulder grashey]
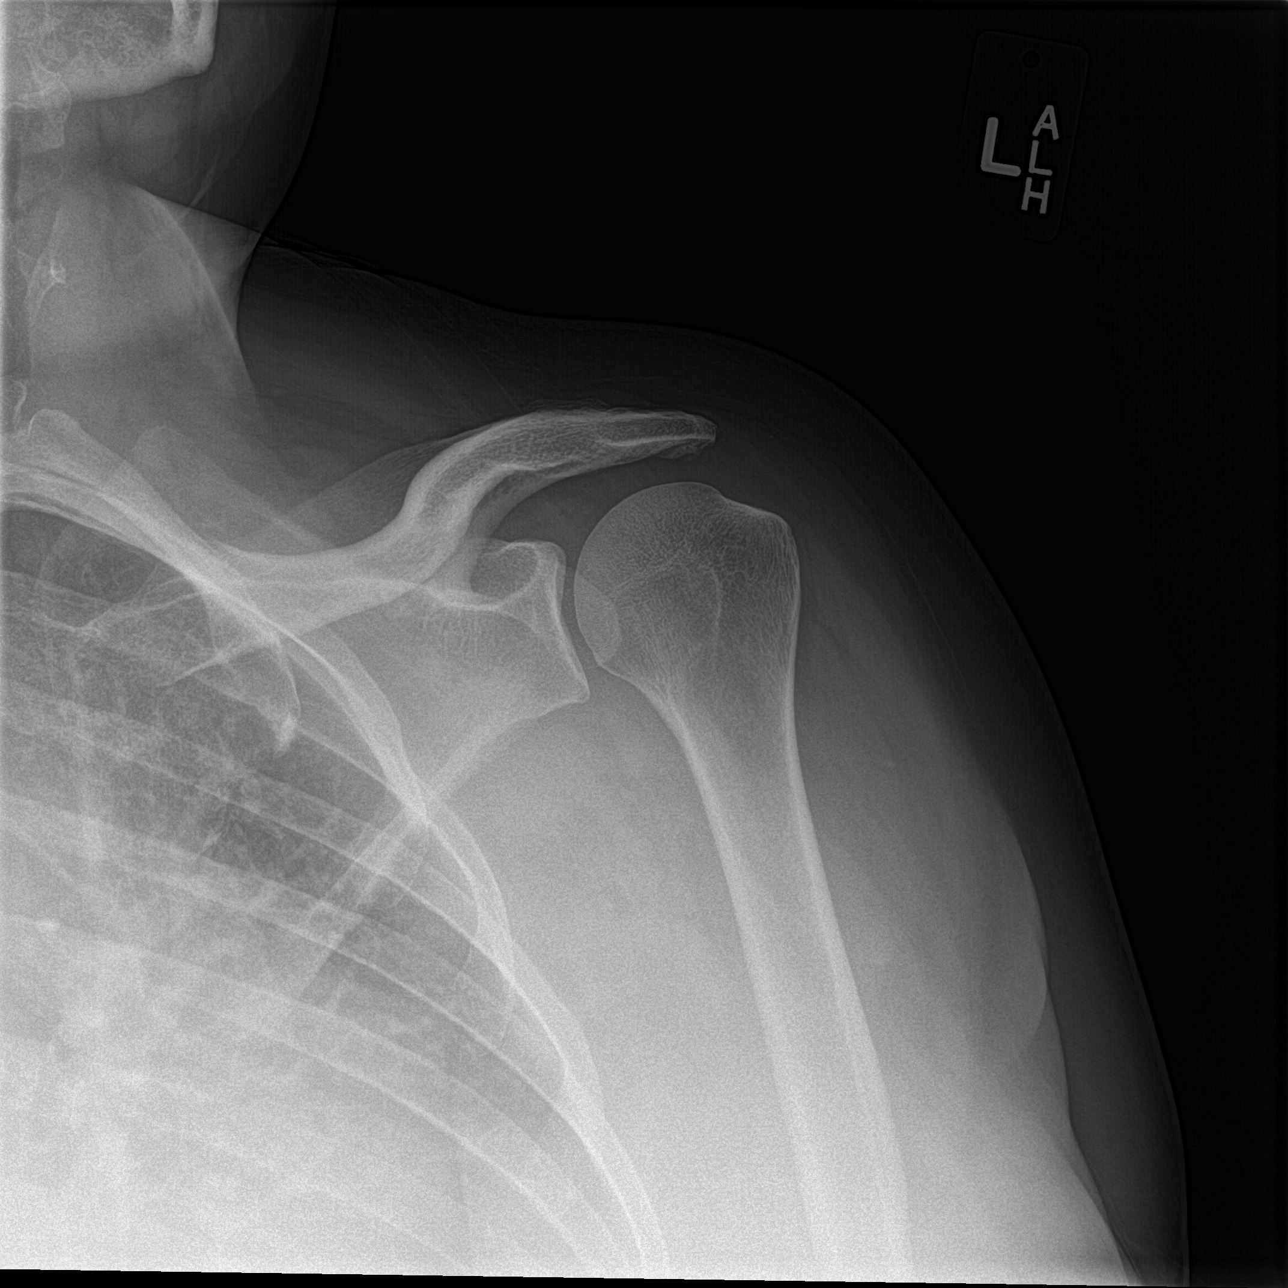

[shoulder y view]
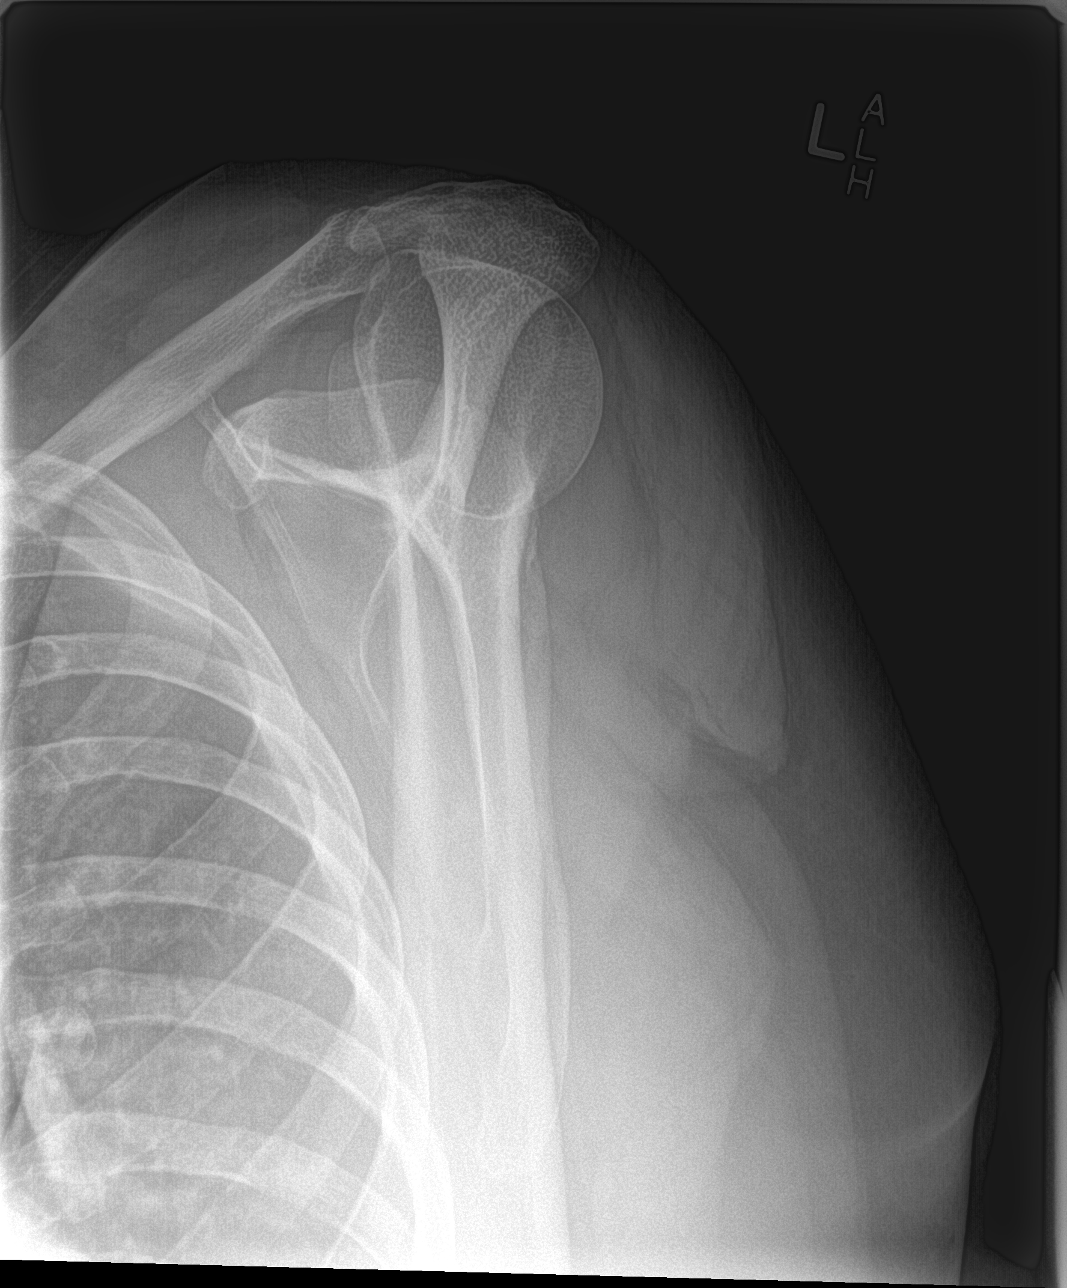

[shoulder axillary]
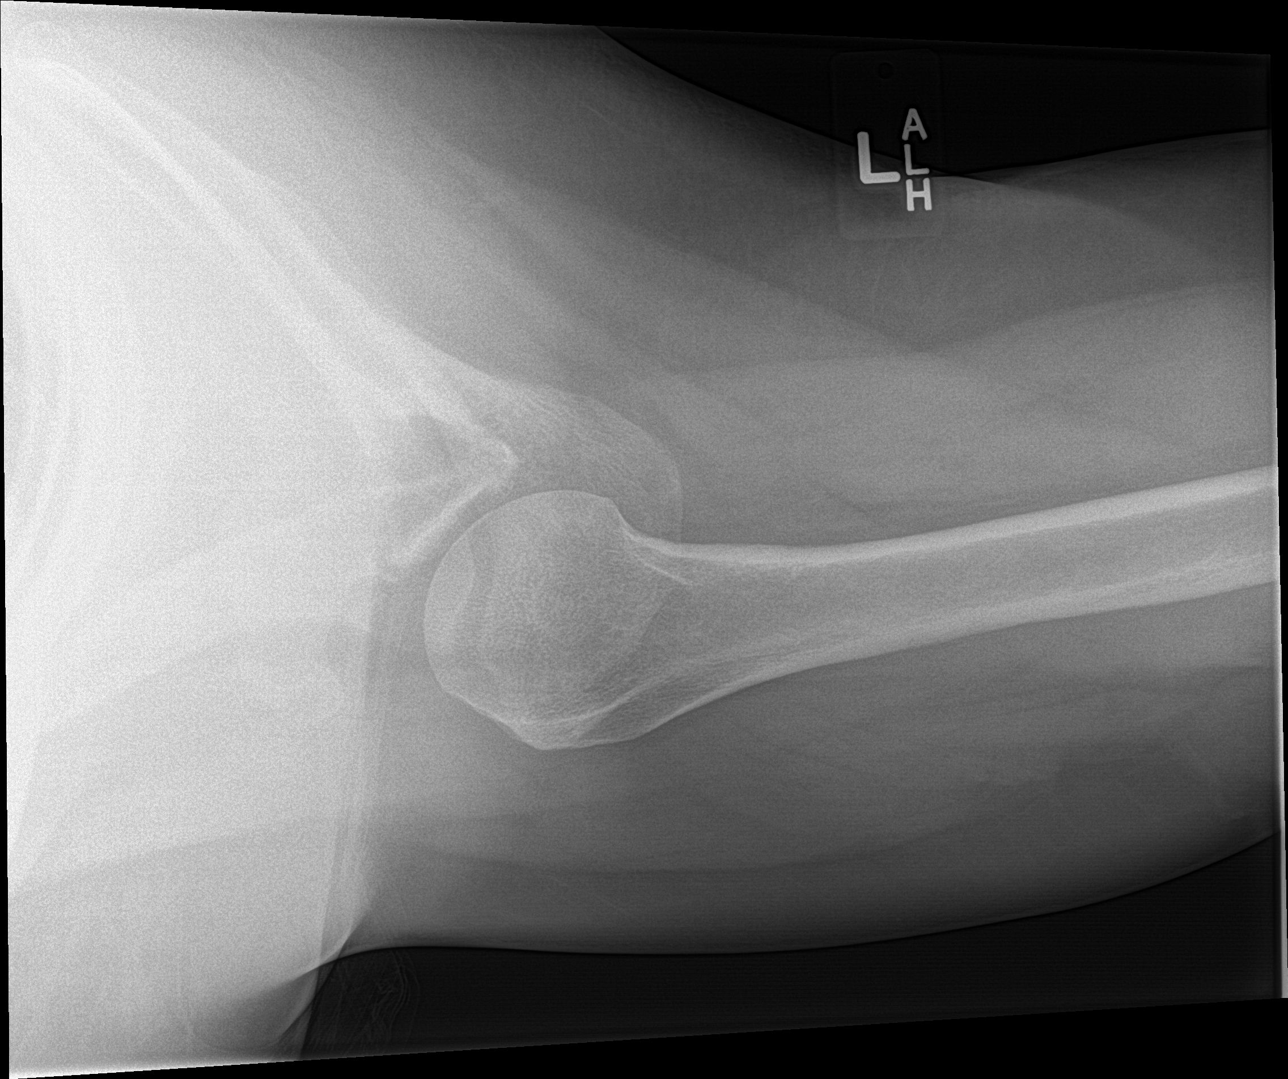

[3 of 3 positions shown; findings below may reference images not displayed]

FINDINGS: Frontal, Y scapular, and axillary images were obtained. There is no
demonstrable fracture or dislocation. Joint spaces appear intact. No
erosive change.
IMPRESSION: No fracture or dislocation.  No appreciable arthropathic change.

## 2017-03-19 IMAGING — DX DG CLAVICLE*L*
2 series · 2 of 2 positions shown · non-contrast
Comparison: None.

CLINICAL DATA: Left shoulder pain for 10 days. Patient heard a pop
5 days ago. Works lifting heavy boxes at work.

EXAM:
LEFT CLAVICLE - 2+ VIEWS

[clavicle ap]
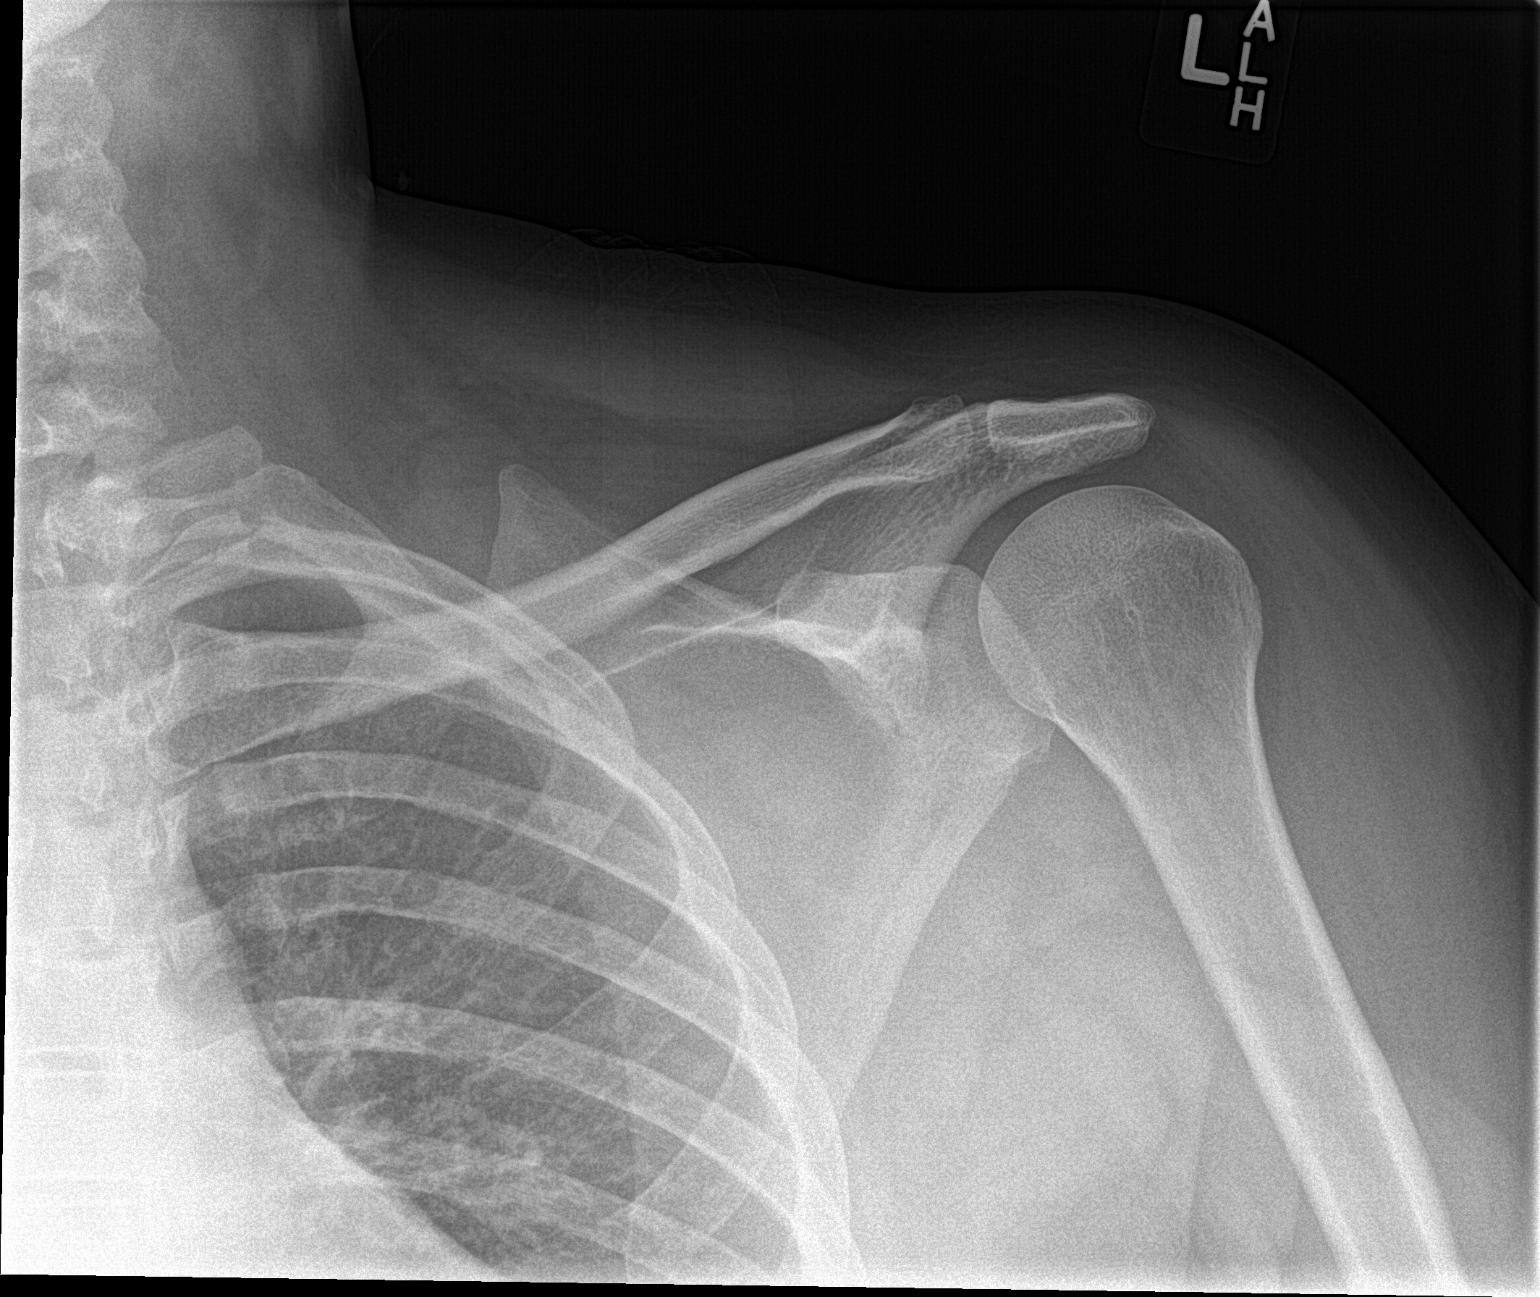

[clavicle axial]
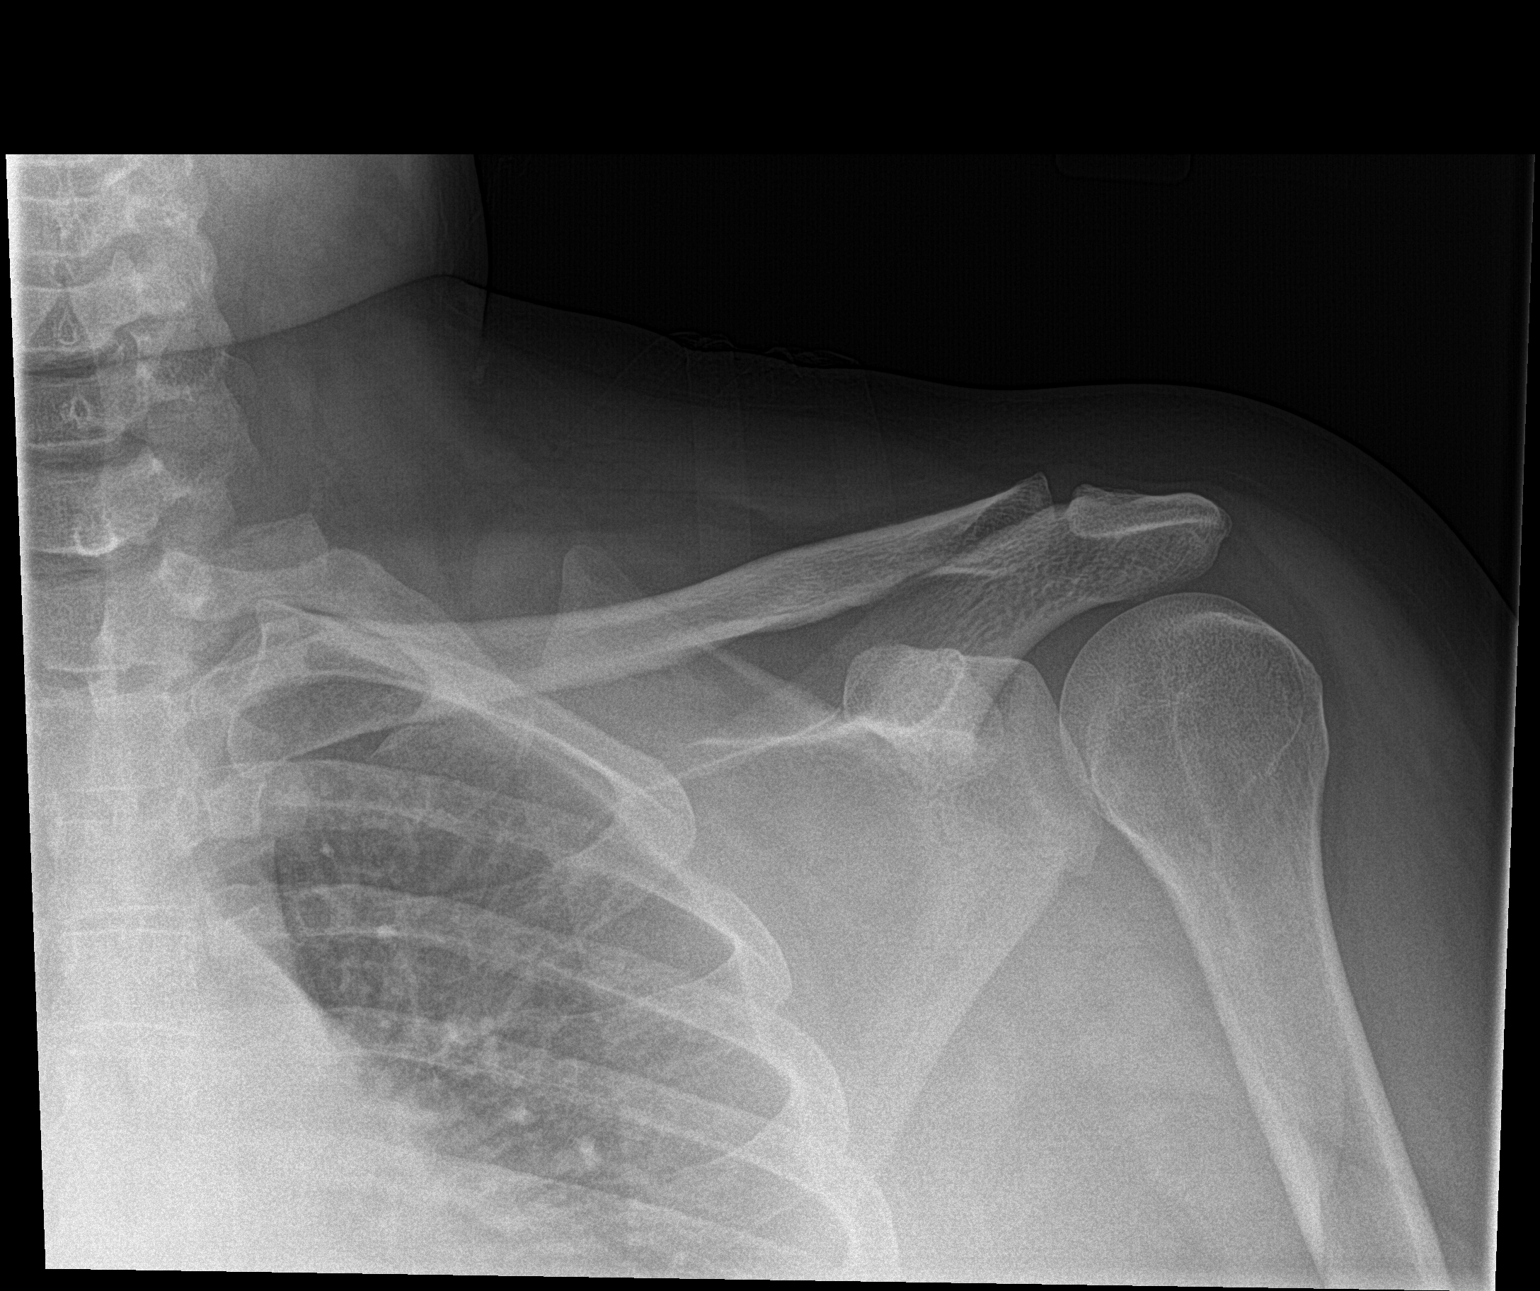

[2 of 2 positions shown; findings below may reference images not displayed]

FINDINGS: There is no evidence of fracture or other focal bone lesions. Note
is made of subacromial narrowing which can be associated with
rotator cuff injury.
IMPRESSION: No evidence for acute  abnormality.

## 2017-03-19 MED ORDER — HYDROCODONE-ACETAMINOPHEN 5-325 MG PO TABS: 1.0000 | ORAL_TABLET | ORAL | 0 refills | Status: DC | PRN

## 2017-03-19 MED ORDER — CLINDAMYCIN HCL 150 MG PO CAPS: 450.0000 mg | ORAL_CAPSULE | Freq: Three times a day (TID) | ORAL | 0 refills | Status: AC

## 2017-03-19 MED ORDER — LIDOCAINE HCL 2 % IJ SOLN
5.0000 mL | Freq: Once | INTRAMUSCULAR | Status: AC
  Administered 2017-03-19: 100 mg via INTRADERMAL
  Filled 2017-03-19: qty 20

## 2017-03-19 MED ORDER — HYDROCODONE-ACETAMINOPHEN 5-325 MG PO TABS
1.0000 | ORAL_TABLET | Freq: Once | ORAL | Status: AC
  Administered 2017-03-19: 1 via ORAL
  Filled 2017-03-19: qty 1

## 2017-03-19 MED ORDER — IBUPROFEN 800 MG PO TABS
800.0000 mg | ORAL_TABLET | Freq: Once | ORAL | Status: AC
  Administered 2017-03-19: 800 mg via ORAL
  Filled 2017-03-19: qty 1
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
## 2017-03-19 NOTE — Discharge Instructions (Signed)
Take Clindamycin as prescribed - 3 tablets 3 times a day for 10 days. Do not stop taking them until this prescription is completed. This is very important in fighting this (or any) infection. Norco for severe pain, and Ibuprofen 3 times daily for pain and inflammation.

## 2017-03-19 NOTE — ED Notes (Signed)
Patient transported to X-ray 

## 2018-04-06 ENCOUNTER — Emergency Department (HOSPITAL_COMMUNITY): Payer: Self-pay

## 2018-04-06 ENCOUNTER — Emergency Department (HOSPITAL_COMMUNITY)
Admission: EM | Admit: 2018-04-06 | Discharge: 2018-04-06 | Disposition: A | Discharge status: Home / Self Care | Payer: Self-pay | Attending: Emergency Medicine | Admitting: Emergency Medicine

## 2018-04-06 ENCOUNTER — Encounter (HOSPITAL_COMMUNITY): Payer: Self-pay | Admitting: Emergency Medicine

## 2018-04-06 DIAGNOSIS — I252 Old myocardial infarction: Secondary | ICD-10-CM | POA: Insufficient documentation

## 2018-04-06 DIAGNOSIS — S39012A Strain of muscle, fascia and tendon of lower back, initial encounter: Secondary | ICD-10-CM | POA: Insufficient documentation

## 2018-04-06 DIAGNOSIS — Y939 Activity, unspecified: Secondary | ICD-10-CM | POA: Insufficient documentation

## 2018-04-06 DIAGNOSIS — Z87891 Personal history of nicotine dependence: Secondary | ICD-10-CM | POA: Insufficient documentation

## 2018-04-06 DIAGNOSIS — Y929 Unspecified place or not applicable: Secondary | ICD-10-CM | POA: Insufficient documentation

## 2018-04-06 DIAGNOSIS — R590 Localized enlarged lymph nodes: Secondary | ICD-10-CM | POA: Insufficient documentation

## 2018-04-06 DIAGNOSIS — R05 Cough: Secondary | ICD-10-CM | POA: Insufficient documentation

## 2018-04-06 DIAGNOSIS — Y999 Unspecified external cause status: Secondary | ICD-10-CM | POA: Insufficient documentation

## 2018-04-06 DIAGNOSIS — X500XXA Overexertion from strenuous movement or load, initial encounter: Secondary | ICD-10-CM | POA: Insufficient documentation

## 2018-04-06 LAB — TSH: TSH: 1.228 u[IU]/mL (ref 0.350–4.500)

## 2018-04-06 LAB — CBC WITH DIFFERENTIAL/PLATELET
Abs Immature Granulocytes: 0.02 10*3/uL (ref 0.00–0.07)
Basophils Absolute: 0 10*3/uL (ref 0.0–0.1)
Basophils Relative: 1 %
EOS ABS: 0.2 10*3/uL (ref 0.0–0.5)
EOS PCT: 4 %
HEMATOCRIT: 35 % — AB (ref 36.0–46.0)
Hemoglobin: 11.2 g/dL — ABNORMAL LOW (ref 12.0–15.0)
Immature Granulocytes: 0 %
LYMPHS ABS: 3 10*3/uL (ref 0.7–4.0)
Lymphocytes Relative: 46 %
MCH: 28.2 pg (ref 26.0–34.0)
MCHC: 32 g/dL (ref 30.0–36.0)
MCV: 88.2 fL (ref 80.0–100.0)
MONOS PCT: 9 %
Monocytes Absolute: 0.6 10*3/uL (ref 0.1–1.0)
NRBC: 0 % (ref 0.0–0.2)
Neutro Abs: 2.6 10*3/uL (ref 1.7–7.7)
Neutrophils Relative %: 40 %
Platelets: 255 10*3/uL (ref 150–400)
RBC: 3.97 MIL/uL (ref 3.87–5.11)
RDW: 12.2 % (ref 11.5–15.5)
WBC: 6.4 10*3/uL (ref 4.0–10.5)

## 2018-04-06 LAB — BASIC METABOLIC PANEL
Anion gap: 7 (ref 5–15)
BUN: 12 mg/dL (ref 6–20)
CALCIUM: 8.9 mg/dL (ref 8.9–10.3)
CO2: 21 mmol/L — AB (ref 22–32)
CREATININE: 0.73 mg/dL (ref 0.44–1.00)
Chloride: 109 mmol/L (ref 98–111)
GFR calc Af Amer: >60 mL/min (ref >60)
GFR calc non Af Amer: >60 mL/min (ref >60)
GLUCOSE: 93 mg/dL (ref 70–99)
Potassium: 3.7 mmol/L (ref 3.5–5.1)
Sodium: 137 mmol/L (ref 135–145)

## 2018-04-06 LAB — I-STAT TROPONIN, ED: Troponin i, poc: 0 ng/mL (ref 0.00–0.08)

## 2018-04-06 MED ORDER — CYCLOBENZAPRINE HCL 10 MG PO TABS
5.0000 mg | ORAL_TABLET | Freq: Once | ORAL | Status: AC
  Administered 2018-04-06: 5 mg via ORAL
  Filled 2018-04-06: qty 1

## 2018-04-06 MED ORDER — METHYLPREDNISOLONE SODIUM SUCC 125 MG IJ SOLR
125.0000 mg | Freq: Once | INTRAMUSCULAR | Status: AC
  Administered 2018-04-06: 125 mg via INTRAVENOUS
  Filled 2018-04-06: qty 2

## 2018-04-06 MED ORDER — CYCLOBENZAPRINE HCL 5 MG PO TABS: 5.0000 mg | ORAL_TABLET | Freq: Three times a day (TID) | ORAL | 0 refills | Status: DC | PRN

## 2018-04-06 MED ORDER — IBUPROFEN 800 MG PO TABS: 800.0000 mg | ORAL_TABLET | Freq: Three times a day (TID) | ORAL | 0 refills | Status: DC

## 2018-04-06 MED ORDER — PREDNISONE 20 MG PO TABS: ORAL_TABLET | ORAL | 0 refills | Status: DC

## 2018-04-06 MED ORDER — MORPHINE SULFATE (PF) 4 MG/ML IV SOLN
4.0000 mg | Freq: Once | INTRAVENOUS | Status: AC
  Administered 2018-04-06: 4 mg via INTRAVENOUS
  Filled 2018-04-06: qty 1

## 2018-04-06 MED ORDER — SODIUM CHLORIDE 0.9 % IV BOLUS
1000.0000 mL | Freq: Once | INTRAVENOUS | Status: AC
  Administered 2018-04-06: 1000 mL via INTRAVENOUS
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
## 2018-04-06 NOTE — Discharge Instructions (Addendum)
Take motrin for back pain. It will help with your neck swelling as well. You have swollen lymph nodes in your neck.   Take prednisone as prescribed.   Take flexeril for muscle spasms.   You should see spine doctor for follow up in your back. If you neck is still swollen in a month, see ENT doctor for follow up   Return to ER if you have worse cough, back pain, trouble walking, fever, vomiting

## 2018-04-06 NOTE — ED Notes (Signed)
Pt informed she needs to provide a UA when possible. Pt verbalized understanding / given UA cup.

## 2018-04-06 NOTE — ED Triage Notes (Signed)
Pt reports L mid-lower back pain x 3 months. 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 Pt also reports productive cough with green sputum. Pt reports some nausea. Pt reports swelling to L side of face and pain to L ear.

## 2018-04-06 NOTE — ED Notes (Signed)
Pt transported to xray 

## 2018-06-15 ENCOUNTER — Other Ambulatory Visit: Payer: Self-pay

## 2018-06-15 ENCOUNTER — Emergency Department (HOSPITAL_COMMUNITY)
Admission: EM | Admit: 2018-06-15 | Discharge: 2018-06-16 | Disposition: A | Discharge status: Home / Self Care | Payer: Self-pay | Attending: Emergency Medicine | Admitting: Emergency Medicine

## 2018-06-15 DIAGNOSIS — Z79899 Other long term (current) drug therapy: Secondary | ICD-10-CM | POA: Insufficient documentation

## 2018-06-15 DIAGNOSIS — Z87891 Personal history of nicotine dependence: Secondary | ICD-10-CM | POA: Insufficient documentation

## 2018-06-15 DIAGNOSIS — K047 Periapical abscess without sinus: Secondary | ICD-10-CM | POA: Insufficient documentation

## 2018-06-15 MED ORDER — OXYCODONE HCL 5 MG PO TABS
5.0000 mg | ORAL_TABLET | Freq: Once | ORAL | Status: AC
  Administered 2018-06-15: 5 mg via ORAL
  Filled 2018-06-15: qty 1

## 2018-06-15 MED ORDER — CLINDAMYCIN HCL 150 MG PO CAPS: 300.0000 mg | ORAL_CAPSULE | Freq: Once | ORAL | Status: DC

## 2018-06-15 MED ORDER — CLINDAMYCIN HCL 150 MG PO CAPS
450.0000 mg | ORAL_CAPSULE | Freq: Once | ORAL | Status: AC
  Administered 2018-06-15: 450 mg via ORAL
  Filled 2018-06-15: qty 3

## 2018-06-15 MED ORDER — NAPROXEN 250 MG PO TABS
500.0000 mg | ORAL_TABLET | Freq: Once | ORAL | Status: AC
  Administered 2018-06-15: 500 mg via ORAL
  Filled 2018-06-15: qty 2
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
## 2018-06-15 NOTE — ED Triage Notes (Signed)
Patient complaining of abscess on left side of face. Has had this happen before. Tried warm compresses and orajel moutwash but nothing has helped. Patient took 1500 mg of Tylenol at 9 pm.

## 2018-06-16 LAB — COMPREHENSIVE METABOLIC PANEL
ALT: 17 U/L (ref 0–44)
AST: 22 U/L (ref 15–41)
Albumin: 3.6 g/dL (ref 3.5–5.0)
Alkaline Phosphatase: 61 U/L (ref 38–126)
Anion gap: 8 (ref 5–15)
BUN: 15 mg/dL (ref 6–20)
CO2: 22 mmol/L (ref 22–32)
Calcium: 8.9 mg/dL (ref 8.9–10.3)
Chloride: 106 mmol/L (ref 98–111)
Creatinine, Ser: 0.84 mg/dL (ref 0.44–1.00)
GFR calc Af Amer: >60 mL/min (ref >60)
GFR calc non Af Amer: >60 mL/min (ref >60)
Glucose, Bld: 114 mg/dL — ABNORMAL HIGH (ref 70–99)
Potassium: 3.5 mmol/L (ref 3.5–5.1)
Sodium: 136 mmol/L (ref 135–145)
Total Bilirubin: 0.5 mg/dL (ref 0.3–1.2)
Total Protein: 7.3 g/dL (ref 6.5–8.1)

## 2018-06-16 LAB — CBC
HCT: 36.7 % (ref 36.0–46.0)
Hemoglobin: 11.5 g/dL — ABNORMAL LOW (ref 12.0–15.0)
MCH: 28.3 pg (ref 26.0–34.0)
MCHC: 31.3 g/dL (ref 30.0–36.0)
MCV: 90.4 fL (ref 80.0–100.0)
Platelets: 293 10*3/uL (ref 150–400)
RBC: 4.06 MIL/uL (ref 3.87–5.11)
RDW: 12.6 % (ref 11.5–15.5)
WBC: 10 10*3/uL (ref 4.0–10.5)
nRBC: 0 % (ref 0.0–0.2)

## 2018-06-16 LAB — ACETAMINOPHEN LEVEL: Acetaminophen (Tylenol), Serum: 16 ug/mL (ref 10–30)

## 2018-06-16 MED ORDER — IBUPROFEN 800 MG PO TABS: 800.0000 mg | ORAL_TABLET | Freq: Three times a day (TID) | ORAL | 0 refills | Status: DC

## 2018-06-16 MED ORDER — CLINDAMYCIN HCL 150 MG PO CAPS: 450.0000 mg | ORAL_CAPSULE | Freq: Three times a day (TID) | ORAL | 0 refills | Status: AC

## 2018-06-16 MED ORDER — HYDROCODONE-ACETAMINOPHEN 5-325 MG PO TABS: 1.0000 | ORAL_TABLET | ORAL | 0 refills | Status: DC | PRN

## 2018-06-16 NOTE — Discharge Instructions (Signed)
Follow-up with a dentist.  Only a dentist can fix your problem.  We recommend that you take Clindamycin as prescribed to treat likely underlying infection.  Use this with a daily probiotic which can be purchased at your local pharmacy. Take ibuprofen as prescribed for pain control.  You have been prescribed Norco to take as needed for severe pain.  Do not drive or drink alcohol after taking this medication as it may make you drowsy and impair your judgment.  Return to the ED for any new or concerning symptoms.

## 2018-06-17 IMAGING — CR DG FOOT COMPLETE 3+V*L*
3 series · 3 of 3 positions shown · non-contrast
Comparison: 03/17/2013

CLINICAL DATA: Lateral foot pain.  Dropped box on foot 1 week ago.

EXAM:
LEFT FOOT - COMPLETE 3+ VIEW

[foot ap]
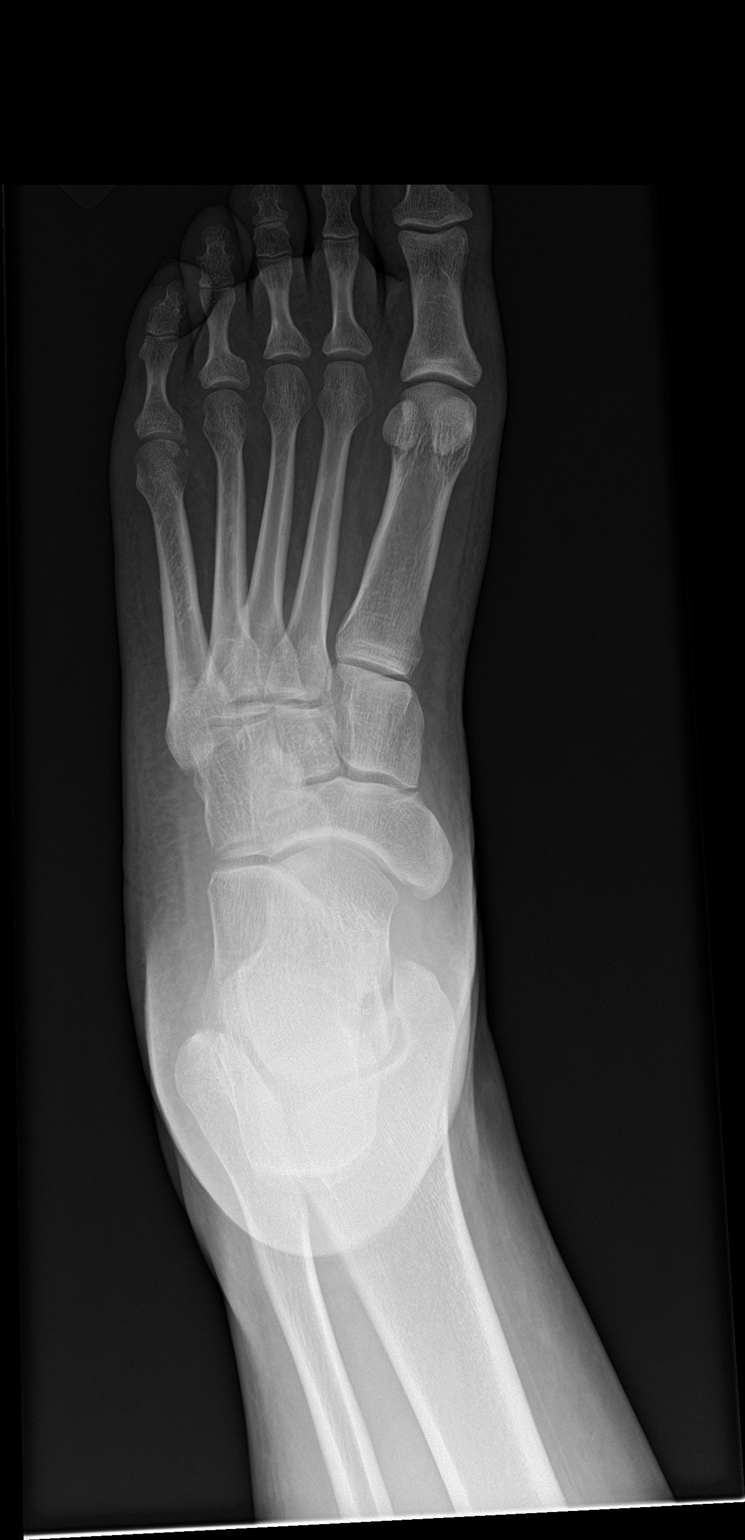

[foot obl]
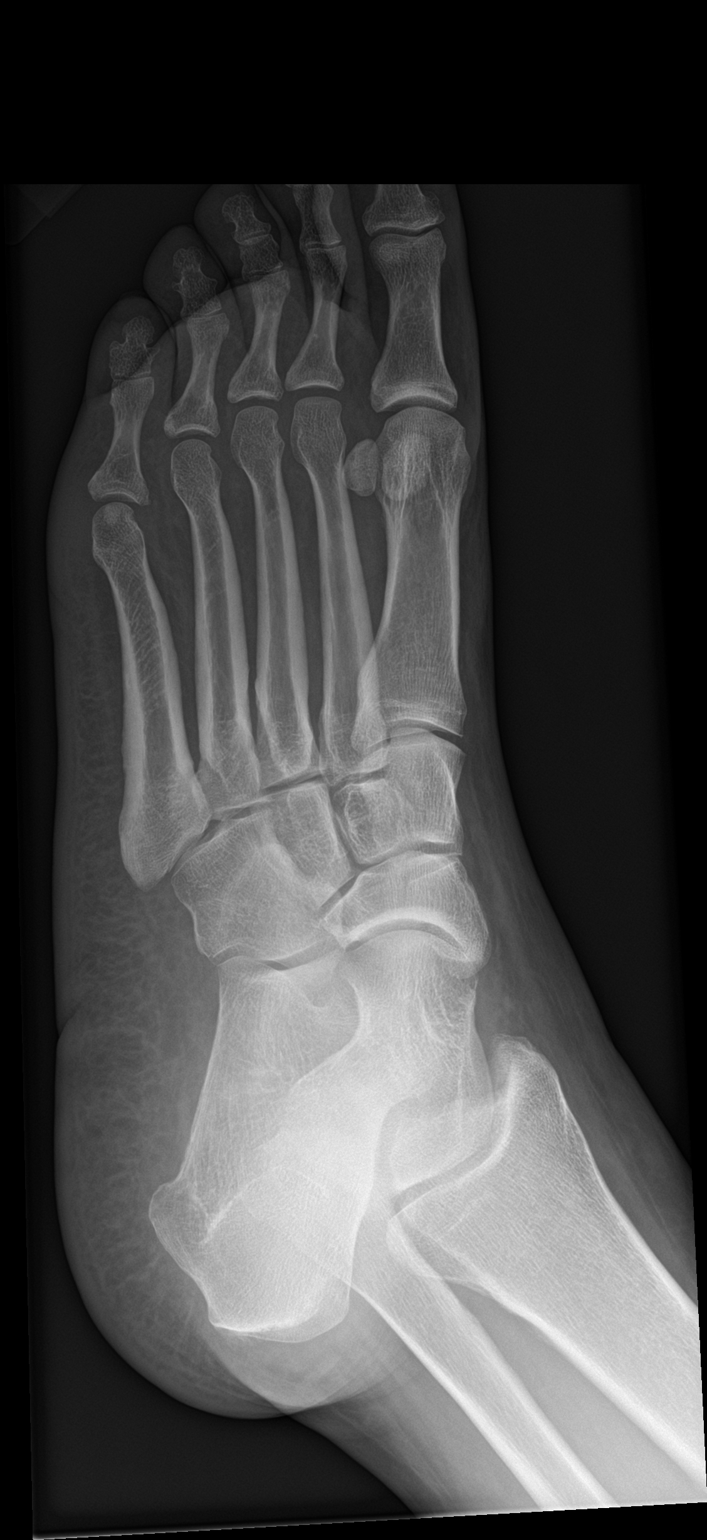

[foot lat]
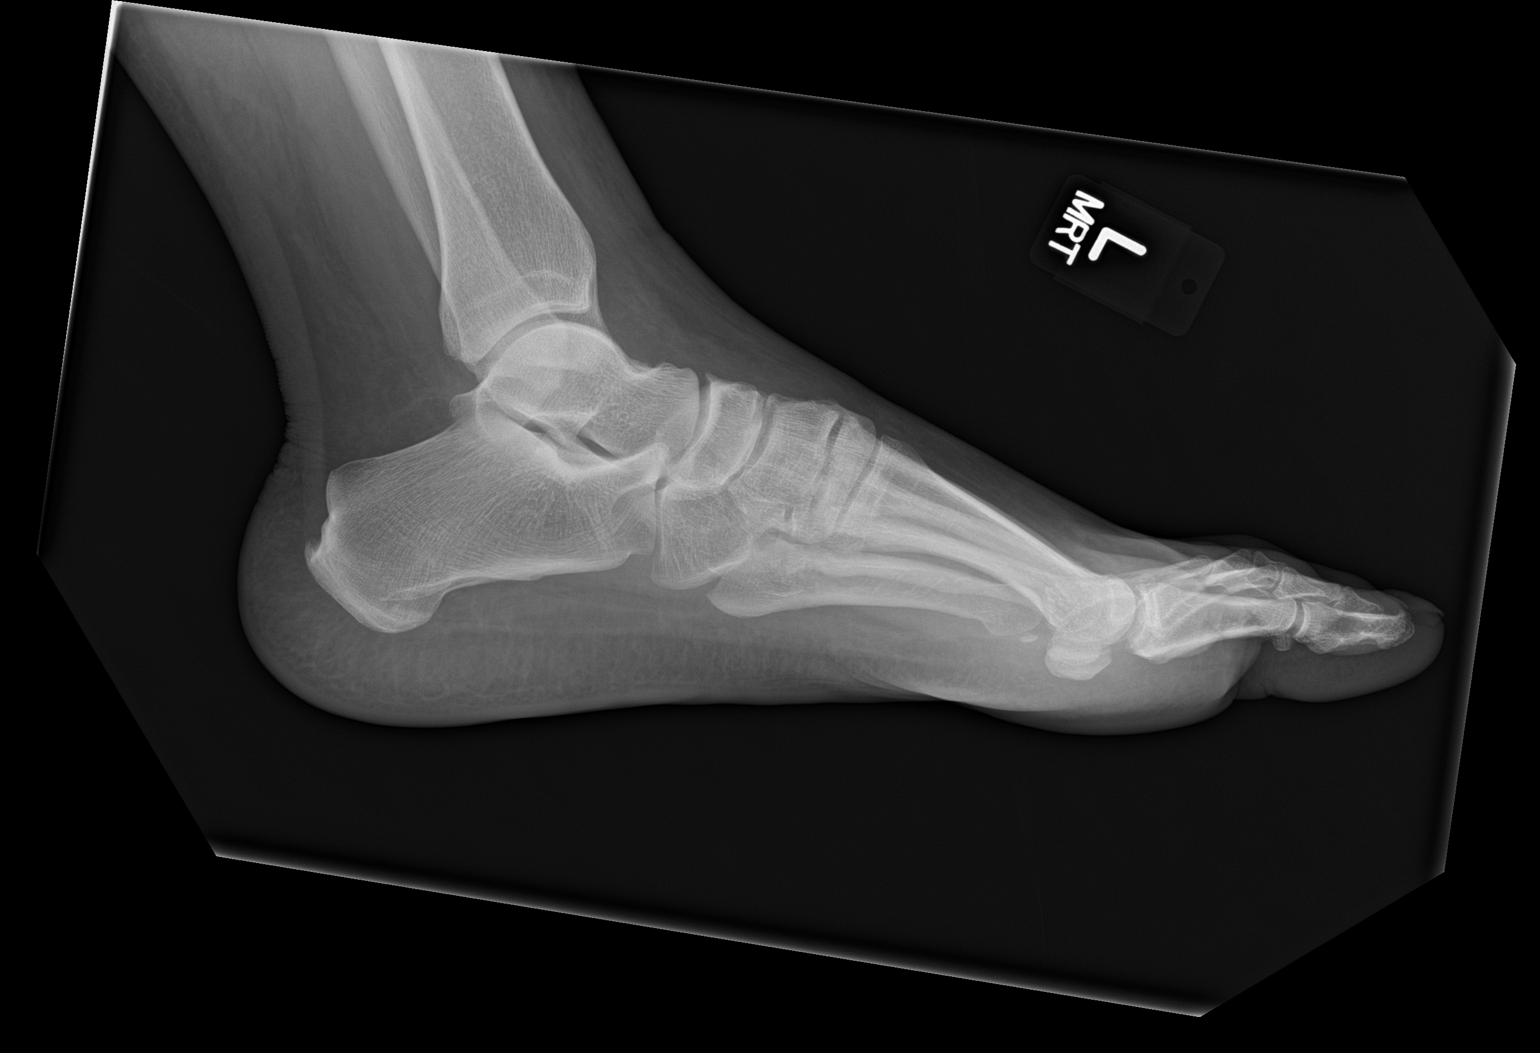

[3 of 3 positions shown; findings below may reference images not displayed]

FINDINGS: There is no evidence of fracture or dislocation. There is no
evidence of arthropathy or other focal bone abnormality. Soft
tissues are unremarkable.
IMPRESSION: Negative.

## 2018-09-12 ENCOUNTER — Emergency Department (HOSPITAL_COMMUNITY)
Admission: EM | Admit: 2018-09-12 | Discharge: 2018-09-12 | Disposition: A | Discharge status: Home / Self Care | Payer: Self-pay | Attending: Emergency Medicine | Admitting: Emergency Medicine

## 2018-09-12 DIAGNOSIS — Z79899 Other long term (current) drug therapy: Secondary | ICD-10-CM | POA: Insufficient documentation

## 2018-09-12 DIAGNOSIS — M5432 Sciatica, left side: Secondary | ICD-10-CM | POA: Insufficient documentation

## 2018-09-12 DIAGNOSIS — Z87891 Personal history of nicotine dependence: Secondary | ICD-10-CM | POA: Insufficient documentation

## 2018-09-12 MED ORDER — PREDNISONE 10 MG PO TABS: ORAL_TABLET | ORAL | 0 refills | Status: DC

## 2018-09-12 MED ORDER — DICLOFENAC SODIUM 50 MG PO TBEC: 50.0000 mg | DELAYED_RELEASE_TABLET | Freq: Two times a day (BID) | ORAL | 0 refills | Status: DC

## 2018-09-12 MED ORDER — KETOROLAC TROMETHAMINE 60 MG/2ML IM SOLN
60.0000 mg | Freq: Once | INTRAMUSCULAR | Status: AC
  Administered 2018-09-12: 60 mg via INTRAMUSCULAR
  Filled 2018-09-12: qty 2
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
## 2018-09-12 NOTE — Discharge Instructions (Signed)
I reviewed your x-ray from January 2020 and it was read as normal.  Your symptoms are suggestive of nerve pain originating in your lower back and radiating to the foot.  Prescription for prednisone and anti-inflammatory medication E prescribed to your pharmacy.  Continue to try follow-up with your primary care doctor.  Alternate heat and ice.

## 2018-09-12 NOTE — ED Triage Notes (Addendum)
Pt has hx of back pain. Pain in LLQ and radiating down to buttocks. Radiates down to foot. Lifting leg causes worsening pain. Has been told she has pinched nerve in past. Pt reports no chest pain and no SOB.

## 2018-11-18 ENCOUNTER — Encounter (HOSPITAL_COMMUNITY): Payer: Self-pay | Admitting: Emergency Medicine

## 2018-11-18 ENCOUNTER — Other Ambulatory Visit: Payer: Self-pay

## 2018-11-18 ENCOUNTER — Emergency Department (HOSPITAL_COMMUNITY)
Admission: EM | Admit: 2018-11-18 | Discharge: 2018-11-19 | Disposition: A | Discharge status: Home / Self Care | Payer: Self-pay | Attending: Emergency Medicine | Admitting: Emergency Medicine

## 2018-11-18 DIAGNOSIS — R1013 Epigastric pain: Secondary | ICD-10-CM

## 2018-11-18 DIAGNOSIS — Z79899 Other long term (current) drug therapy: Secondary | ICD-10-CM | POA: Insufficient documentation

## 2018-11-18 DIAGNOSIS — M545 Low back pain, unspecified: Secondary | ICD-10-CM

## 2018-11-18 DIAGNOSIS — G8929 Other chronic pain: Secondary | ICD-10-CM

## 2018-11-18 DIAGNOSIS — Z859 Personal history of malignant neoplasm, unspecified: Secondary | ICD-10-CM | POA: Insufficient documentation

## 2018-11-18 DIAGNOSIS — I252 Old myocardial infarction: Secondary | ICD-10-CM | POA: Insufficient documentation

## 2018-11-18 DIAGNOSIS — M62830 Muscle spasm of back: Secondary | ICD-10-CM | POA: Insufficient documentation

## 2018-11-18 DIAGNOSIS — N9489 Other specified conditions associated with female genital organs and menstrual cycle: Secondary | ICD-10-CM

## 2018-11-18 DIAGNOSIS — Z87891 Personal history of nicotine dependence: Secondary | ICD-10-CM | POA: Insufficient documentation

## 2018-11-18 DIAGNOSIS — D649 Anemia, unspecified: Secondary | ICD-10-CM

## 2018-11-18 LAB — CBC
HCT: 34.1 % — ABNORMAL LOW (ref 36.0–46.0)
Hemoglobin: 11.1 g/dL — ABNORMAL LOW (ref 12.0–15.0)
MCH: 28.8 pg (ref 26.0–34.0)
MCHC: 32.6 g/dL (ref 30.0–36.0)
MCV: 88.6 fL (ref 80.0–100.0)
Platelets: 254 10*3/uL (ref 150–400)
RBC: 3.85 MIL/uL — ABNORMAL LOW (ref 3.87–5.11)
RDW: 12.8 % (ref 11.5–15.5)
WBC: 8 10*3/uL (ref 4.0–10.5)
nRBC: 0 % (ref 0.0–0.2)

## 2018-11-18 LAB — URINALYSIS, ROUTINE W REFLEX MICROSCOPIC
Bilirubin Urine: NEGATIVE
Glucose, UA: NEGATIVE mg/dL
Hgb urine dipstick: NEGATIVE
Ketones, ur: NEGATIVE mg/dL
Leukocytes,Ua: NEGATIVE
Nitrite: NEGATIVE
Protein, ur: NEGATIVE mg/dL
Specific Gravity, Urine: 1.023 (ref 1.005–1.030)
pH: 7 (ref 5.0–8.0)

## 2018-11-18 MED ORDER — SODIUM CHLORIDE 0.9% FLUSH: 3.0000 mL | Freq: Once | INTRAVENOUS | Status: DC

## 2018-11-18 NOTE — ED Triage Notes (Signed)
Patient reports mid/left lateral abdominal pain for several days , denies emesis or diarrhea , no fever or chills . Pt. added headache with no blurred vision or head injury.

## 2018-11-18 NOTE — ED Notes (Signed)
Called pt x3   No answer for

## 2018-11-19 ENCOUNTER — Emergency Department (HOSPITAL_COMMUNITY): Payer: Self-pay

## 2018-11-19 ENCOUNTER — Other Ambulatory Visit: Payer: Self-pay

## 2018-11-19 ENCOUNTER — Encounter (HOSPITAL_COMMUNITY): Payer: Self-pay | Admitting: Radiology

## 2018-11-19 LAB — COMPREHENSIVE METABOLIC PANEL
ALT: 12 U/L (ref 0–44)
AST: 17 U/L (ref 15–41)
Albumin: 3.3 g/dL — ABNORMAL LOW (ref 3.5–5.0)
Alkaline Phosphatase: 59 U/L (ref 38–126)
Anion gap: 8 (ref 5–15)
BUN: 9 mg/dL (ref 6–20)
CO2: 24 mmol/L (ref 22–32)
Calcium: 8.7 mg/dL — ABNORMAL LOW (ref 8.9–10.3)
Chloride: 107 mmol/L (ref 98–111)
Creatinine, Ser: 0.79 mg/dL (ref 0.44–1.00)
GFR calc Af Amer: >60 mL/min (ref >60)
GFR calc non Af Amer: >60 mL/min (ref >60)
Glucose, Bld: 109 mg/dL — ABNORMAL HIGH (ref 70–99)
Potassium: 3.6 mmol/L (ref 3.5–5.1)
Sodium: 139 mmol/L (ref 135–145)
Total Bilirubin: 0.3 mg/dL (ref 0.3–1.2)
Total Protein: 6.6 g/dL (ref 6.5–8.1)

## 2018-11-19 LAB — LIPASE, BLOOD: Lipase: 23 U/L (ref 11–51)

## 2018-11-19 MED ORDER — ALUM & MAG HYDROXIDE-SIMETH 200-200-20 MG/5ML PO SUSP
30.0000 mL | Freq: Once | ORAL | Status: AC
  Administered 2018-11-19: 30 mL via ORAL
  Filled 2018-11-19: qty 30

## 2018-11-19 MED ORDER — IOHEXOL 300 MG/ML  SOLN
100.0000 mL | Freq: Once | INTRAMUSCULAR | Status: AC | PRN
  Administered 2018-11-19: 07:00:00 100 mL via INTRAVENOUS

## 2018-11-19 MED ORDER — CYCLOBENZAPRINE HCL 10 MG PO TABS
10.0000 mg | ORAL_TABLET | Freq: Once | ORAL | Status: AC
  Administered 2018-11-19: 08:00:00 10 mg via ORAL
  Filled 2018-11-19: qty 1

## 2018-11-19 MED ORDER — LIDOCAINE VISCOUS HCL 2 % MT SOLN
15.0000 mL | Freq: Once | OROMUCOSAL | Status: AC
  Administered 2018-11-19: 06:00:00 15 mL via ORAL
  Filled 2018-11-19: qty 15

## 2018-11-19 MED ORDER — PANTOPRAZOLE SODIUM 40 MG PO TBEC
40.0000 mg | DELAYED_RELEASE_TABLET | Freq: Once | ORAL | Status: AC
  Administered 2018-11-19: 08:00:00 40 mg via ORAL
  Filled 2018-11-19: qty 1

## 2018-11-19 NOTE — ED Provider Notes (Signed)
Patient signed out to me by Dr. Docia Furl pending ultrasound of pelvis Patient presented last night complaining of abdominal pain.  CT obtained showed left ovarian mass.  Ultrasound obtained in ED. Patient with cystic mass left adnexa likely within the left ovary She has had a hysterectomy and a right oophorectomy. Plan referral to gynecology for outpatient follow-up and management   Pattricia Boss, MD 11/19/18 [phone]
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
## 2018-11-19 NOTE — Discharge Instructions (Addendum)
Your ultrasound shows a mass on your left ovary Please make appointment with gynecologist for follow up asap

## 2018-11-22 ENCOUNTER — Telehealth: Payer: Self-pay | Admitting: *Deleted

## 2018-11-22 NOTE — Telephone Encounter (Signed)
[name] called this am and left a message she wants to speak with administrator. I called Tarina and she reports she is in a lot of pain and wants to see if her appointment can be moved up sooner.  States ER told  Her to be seen in 15 days.  I informed her I can have MD review her chart and if they feel she needs to be seen sooner or other orders we will get back to her. She voices understanding.  Shishir Krantz,RN

## 2018-11-23 NOTE — Telephone Encounter (Signed)
She can be worked in sooner for any MD provider if possible.  Thank you!

## 2018-11-25 ENCOUNTER — Ambulatory Visit: Payer: Self-pay | Admitting: Obstetrics & Gynecology

## 2018-11-25 ENCOUNTER — Telehealth: Payer: Self-pay | Admitting: Obstetrics & Gynecology

## 2018-11-25 NOTE — Telephone Encounter (Signed)
The patient called at 9:24 stating she was lost. Informed the patient of the 9:00 appointment. Informed the patient of rescheduling however she stated she is in very bad pain. Informed the patient I will send a message over to the nurse. She stated she would like a call back to the [phone] contact number in chart.

## 2018-11-29 IMAGING — CT CT ABD-PELV W/ CM
2 of 5 series · 9 of 46 positions shown, 10 images · IV contrast (Iodine)
Comparison: CT scan 08/22/2014

CLINICAL DATA: Left upper quadrant pain starting 3 weeks ago,
history of inflammatory bowel syndrome, worsening pain, nausea and
vomiting for last 2 days

EXAM:
CT ABDOMEN AND PELVIS WITH CONTRAST
TECHNIQUE: Multidetector CT imaging of the abdomen and pelvis was performed
using the standard protocol following bolus administration of
intravenous contrast.
CONTRAST:  100mL 1F44VT-QUU IOPAMIDOL (1F44VT-QUU) INJECTION 61%

[Series 201: routine, idose (2) · axial · 0.86mm/px · z∈[+526,+911]mm · 6 of 97 slices shown, 7 images]
[im 10/97  soft-tissue]
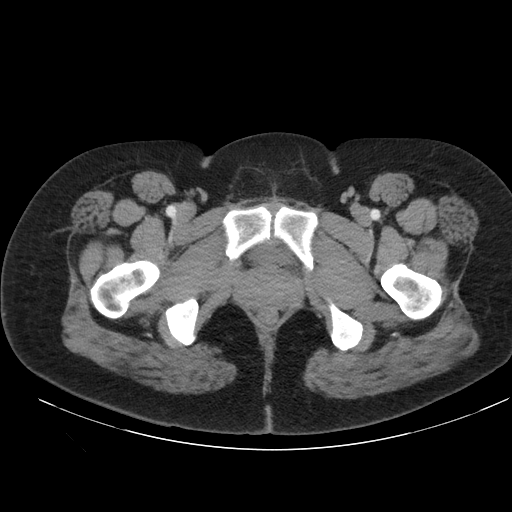
[im 10/97  bone]
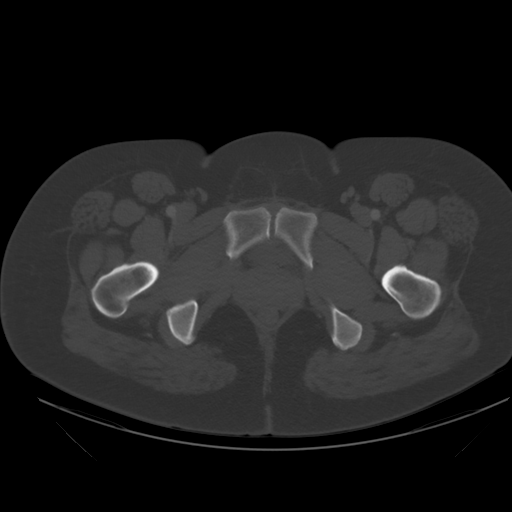
[im 25/97  soft-tissue]
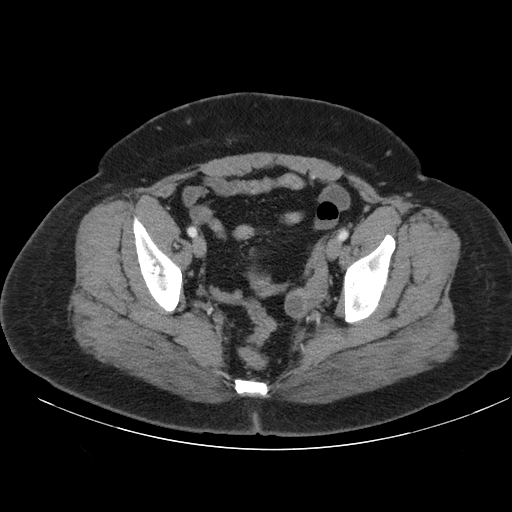
[im 39/97  soft-tissue]
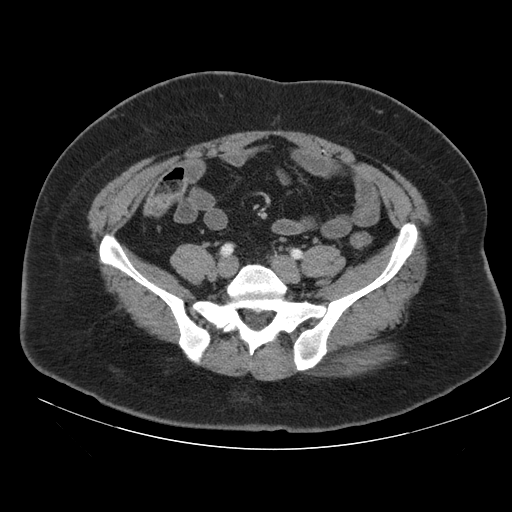
[im 58/97  soft-tissue]
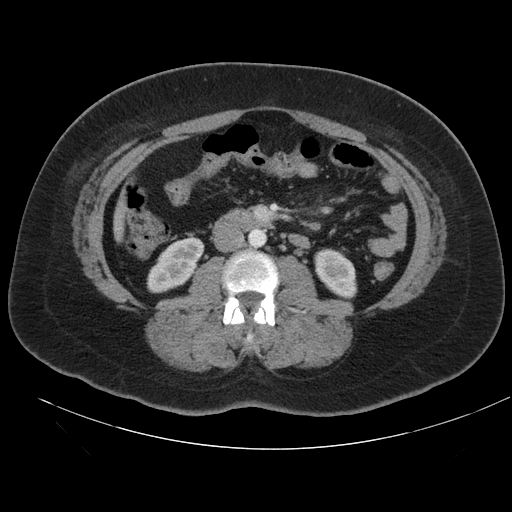
[im 73/97  soft-tissue]
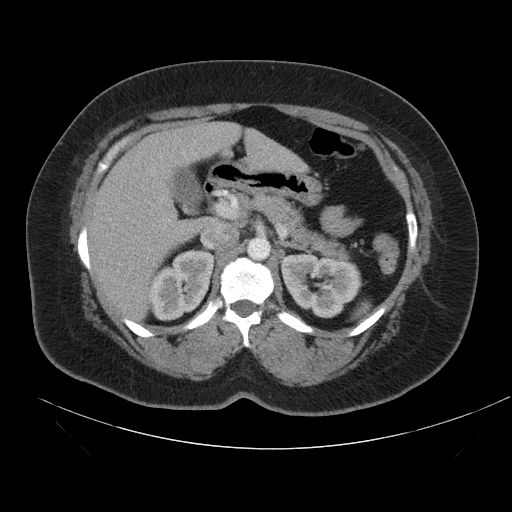
[im 87/97  soft-tissue]
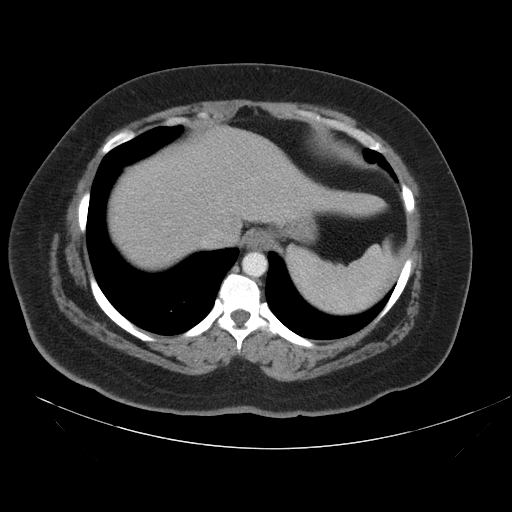

[Series 203: coronals, idose (2) · coronal · 0.45mm/px · 3 of 124 slices shown]
[im 42/124  soft-tissue]
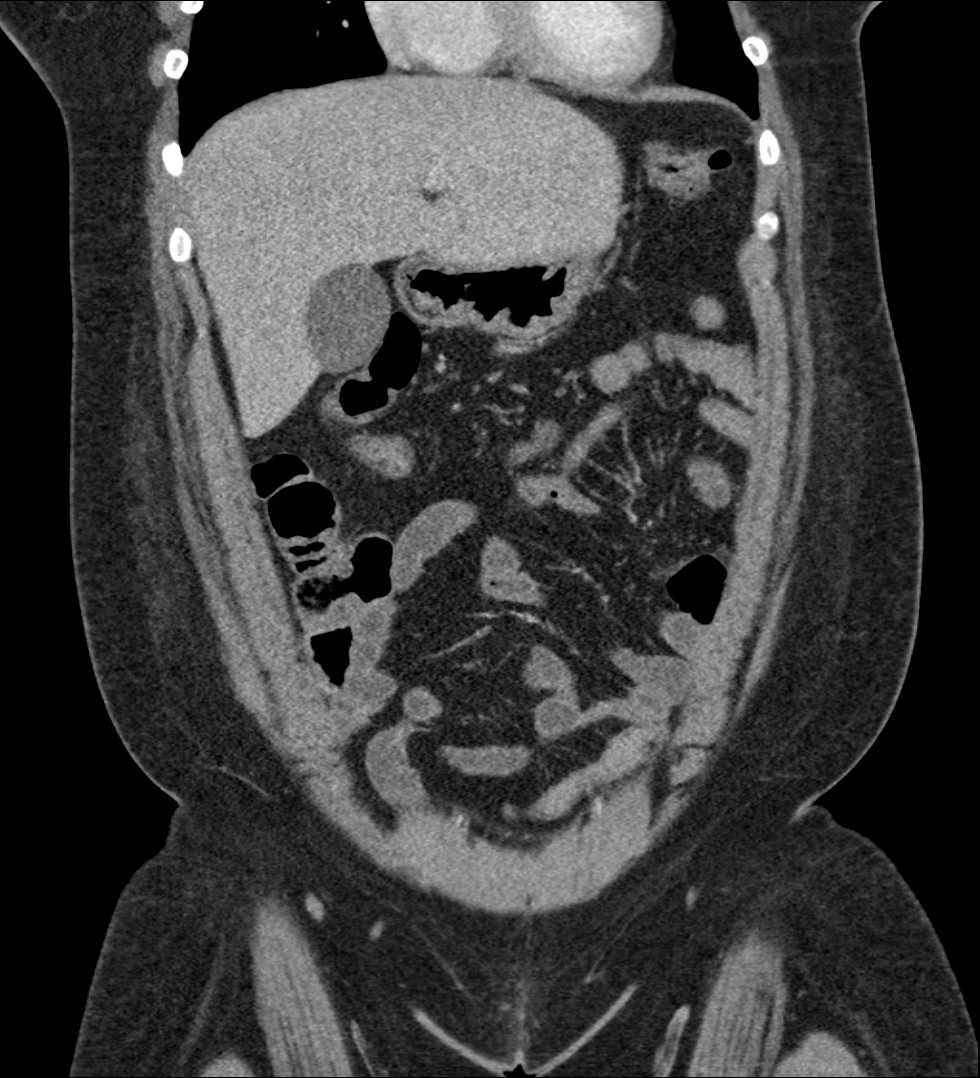
[im 55/124  soft-tissue]
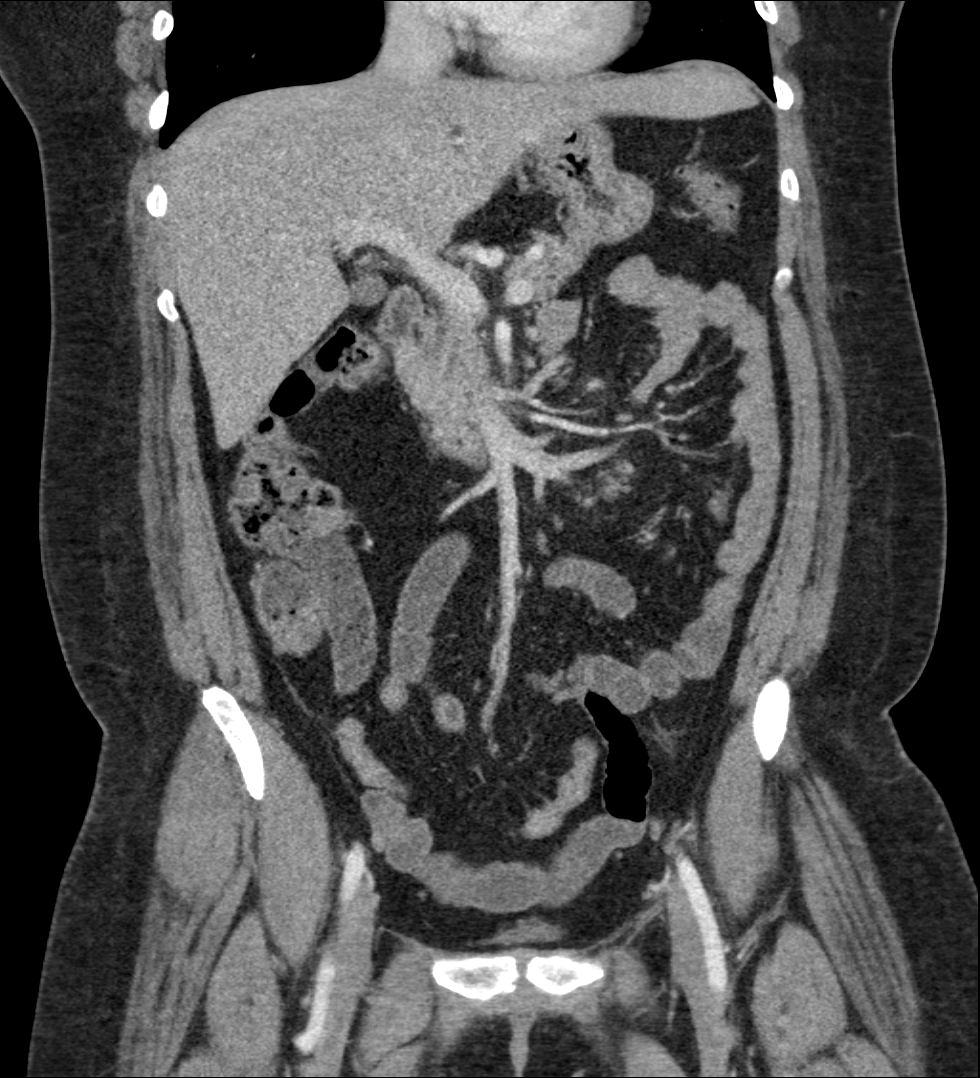
[im 69/124  soft-tissue]
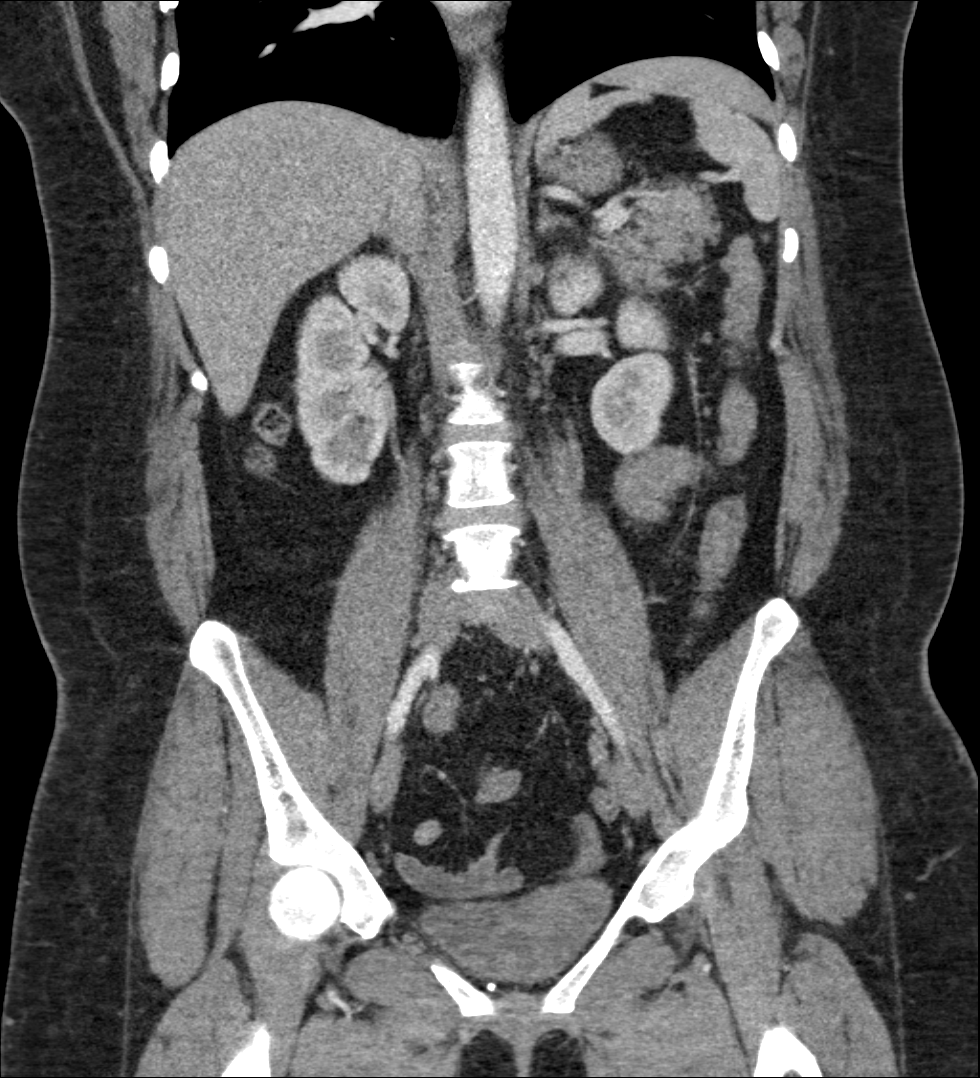

[9 of 46 positions shown; findings below may reference images not displayed]

FINDINGS: Lower chest: Lung bases shows no acute findings

Hepatobiliary: Enhanced liver without focal abnormality. No
calcified gallstones are noted within gallbladder.

Pancreas: Unremarkable. No pancreatic ductal dilatation or
surrounding inflammatory changes.

Spleen: Normal in size without focal abnormality.

Adrenals/Urinary Tract: No adrenal gland mass. Enhanced kidneys are
symmetrical in size. No hydronephrosis or hydroureter. Delayed renal
images shows bilateral renal symmetrical excretion. Bilateral
visualized proximal ureter is unremarkable.

Stomach/Bowel: There is no small bowel obstruction. No thickened or
dilated small bowel loops. The terminal ileum is unremarkable. No
pericecal inflammation. Normal appendix partially visualized axial
image 61.

Some colonic stool noted within right colon. Some colonic gas noted
within transverse colon. Descending colon and sigmoid colon is empty
collapsed. No evidence of acute colitis or diverticulitis. No distal
colonic obstruction.

Vascular/Lymphatic: No aortic aneurysm. Abdominal aorta and iliac
arteries are unremarkable. No retroperitoneal or mesenteric
adenopathy. Few nonspecific lymph nodes are noted in right lower
quadrant the largest measures 6 mm not pathologic.

Reproductive: The uterus is surgically absent. A follicle is noted
within left ovary measures 1.7 cm. No pelvic free fluid.

Other: No ascites or free abdominal air.

Musculoskeletal: No destructive bony lesions are noted. Sagittal
images of the spine shows minimal degenerative changes with anterior
spurring lumbar spine.
IMPRESSION: 1. There is no evidence of acute inflammatory process within
abdomen.
2. No hydronephrosis or hydroureter.
3. No small bowel obstruction. No pericecal inflammation. Normal
appendix partially visualized.
4. No evidence of colitis or acute diverticulitis.
5. Surgically absent uterus. A follicle within left ovary measures
1.7 cm. No pelvic free fluid.

## 2018-12-05 ENCOUNTER — Ambulatory Visit (INDEPENDENT_AMBULATORY_CARE_PROVIDER_SITE_OTHER): Payer: Self-pay | Admitting: Obstetrics and Gynecology

## 2018-12-05 ENCOUNTER — Encounter: Payer: Self-pay | Admitting: Obstetrics and Gynecology

## 2018-12-05 ENCOUNTER — Other Ambulatory Visit: Payer: Self-pay

## 2018-12-05 VITALS — BP 127/87 | HR 98 | Temp 98.2°F | Ht 72.0 in | Wt 300.3 lb

## 2018-12-05 DIAGNOSIS — Z6841 Body Mass Index (BMI) 40.0 and over, adult: Secondary | ICD-10-CM

## 2018-12-05 DIAGNOSIS — N838 Other noninflammatory disorders of ovary, fallopian tube and broad ligament: Secondary | ICD-10-CM
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
## 2018-12-06 LAB — CA 125: Cancer Antigen (CA) 125: 4.4 U/mL (ref 0.0–38.1)

## 2018-12-30 ENCOUNTER — Ambulatory Visit: Payer: Self-pay | Admitting: Obstetrics & Gynecology

## 2019-01-05 ENCOUNTER — Telehealth: Payer: Self-pay | Admitting: *Deleted

## 2019-01-05 NOTE — Telephone Encounter (Signed)
[name] called and reports her pain has gotten worse since she was seen 12/05/18 for an ovarian mass , is now a 10 and the tylenol and ibuprofen which used to help a little are not helping at all. She reports she hurts so bad she is throwing up and not keeping anything down. I advised her to go to ER for evaluation. She voices understanding. Linda,RN

## 2019-01-19 ENCOUNTER — Other Ambulatory Visit: Payer: Self-pay

## 2019-01-19 ENCOUNTER — Ambulatory Visit (HOSPITAL_COMMUNITY)
Admission: RE | Admit: 2019-01-19 | Discharge: 2019-01-19 | Disposition: A | Discharge status: Home / Self Care | Payer: Self-pay | Source: Ambulatory Visit | Attending: Obstetrics and Gynecology | Admitting: Obstetrics and Gynecology

## 2019-01-19 ENCOUNTER — Other Ambulatory Visit: Payer: Self-pay | Admitting: Obstetrics and Gynecology

## 2019-01-19 DIAGNOSIS — N838 Other noninflammatory disorders of ovary, fallopian tube and broad ligament: Secondary | ICD-10-CM

## 2019-01-25 ENCOUNTER — Other Ambulatory Visit: Payer: Self-pay

## 2019-01-25 ENCOUNTER — Ambulatory Visit (INDEPENDENT_AMBULATORY_CARE_PROVIDER_SITE_OTHER): Payer: Self-pay | Admitting: Obstetrics & Gynecology

## 2019-01-25 ENCOUNTER — Encounter: Payer: Self-pay | Admitting: Obstetrics & Gynecology

## 2019-01-25 VITALS — BP 133/88 | HR 96 | Ht 72.0 in | Wt 305.0 lb

## 2019-01-25 DIAGNOSIS — N838 Other noninflammatory disorders of ovary, fallopian tube and broad ligament: Secondary | ICD-10-CM

## 2019-01-25 NOTE — Patient Instructions (Signed)
Return to clinic for any scheduled appointments or for any gynecologic concerns as needed.   
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
## 2019-03-26 ENCOUNTER — Encounter (HOSPITAL_COMMUNITY): Payer: Self-pay | Admitting: Emergency Medicine

## 2019-03-26 ENCOUNTER — Emergency Department (HOSPITAL_COMMUNITY)
Admission: EM | Admit: 2019-03-26 | Discharge: 2019-03-27 | Disposition: A | Discharge status: Home / Self Care | Payer: Medicare Other | Attending: Emergency Medicine | Admitting: Emergency Medicine

## 2019-03-26 ENCOUNTER — Other Ambulatory Visit: Payer: Self-pay

## 2019-03-26 DIAGNOSIS — I252 Old myocardial infarction: Secondary | ICD-10-CM | POA: Insufficient documentation

## 2019-03-26 DIAGNOSIS — U071 COVID-19: Secondary | ICD-10-CM | POA: Insufficient documentation

## 2019-03-26 DIAGNOSIS — R112 Nausea with vomiting, unspecified: Secondary | ICD-10-CM

## 2019-03-26 DIAGNOSIS — Z859 Personal history of malignant neoplasm, unspecified: Secondary | ICD-10-CM | POA: Diagnosis not present

## 2019-03-26 DIAGNOSIS — Z87891 Personal history of nicotine dependence: Secondary | ICD-10-CM | POA: Diagnosis not present

## 2019-03-26 DIAGNOSIS — M5430 Sciatica, unspecified side: Secondary | ICD-10-CM | POA: Diagnosis not present

## 2019-03-26 DIAGNOSIS — R05 Cough: Secondary | ICD-10-CM | POA: Insufficient documentation

## 2019-03-26 DIAGNOSIS — Z20822 Contact with and (suspected) exposure to covid-19: Secondary | ICD-10-CM

## 2019-03-26 DIAGNOSIS — R059 Cough, unspecified: Secondary | ICD-10-CM

## 2019-03-26 DIAGNOSIS — R1110 Vomiting, unspecified: Secondary | ICD-10-CM | POA: Diagnosis present

## 2019-03-26 LAB — COMPREHENSIVE METABOLIC PANEL
ALT: 14 U/L (ref 0–44)
AST: 20 U/L (ref 15–41)
Albumin: 3.6 g/dL (ref 3.5–5.0)
Alkaline Phosphatase: 66 U/L (ref 38–126)
Anion gap: 9 (ref 5–15)
BUN: 8 mg/dL (ref 6–20)
CO2: 23 mmol/L (ref 22–32)
Calcium: 9 mg/dL (ref 8.9–10.3)
Chloride: 107 mmol/L (ref 98–111)
Creatinine, Ser: 0.85 mg/dL (ref 0.44–1.00)
GFR calc Af Amer: >60 mL/min (ref >60)
GFR calc non Af Amer: >60 mL/min (ref >60)
Glucose, Bld: 102 mg/dL — ABNORMAL HIGH (ref 70–99)
Potassium: 3.5 mmol/L (ref 3.5–5.1)
Sodium: 139 mmol/L (ref 135–145)
Total Bilirubin: 0.1 mg/dL — ABNORMAL LOW (ref 0.3–1.2)
Total Protein: 7.2 g/dL (ref 6.5–8.1)

## 2019-03-26 LAB — CBC
HCT: 36.4 % (ref 36.0–46.0)
Hemoglobin: 11.7 g/dL — ABNORMAL LOW (ref 12.0–15.0)
MCH: 28.6 pg (ref 26.0–34.0)
MCHC: 32.1 g/dL (ref 30.0–36.0)
MCV: 89 fL (ref 80.0–100.0)
Platelets: 331 10*3/uL (ref 150–400)
RBC: 4.09 MIL/uL (ref 3.87–5.11)
RDW: 13.2 % (ref 11.5–15.5)
WBC: 9.5 10*3/uL (ref 4.0–10.5)
nRBC: 0 % (ref 0.0–0.2)

## 2019-03-26 LAB — LIPASE, BLOOD: Lipase: 22 U/L (ref 11–51)

## 2019-03-26 MED ORDER — ONDANSETRON 4 MG PO TBDP
4.0000 mg | ORAL_TABLET | Freq: Once | ORAL | Status: AC | PRN
  Administered 2019-03-26: 23:00:00 4 mg via ORAL
  Filled 2019-03-26: qty 1

## 2019-03-26 MED ORDER — SODIUM CHLORIDE 0.9% FLUSH: 3.0000 mL | Freq: Once | INTRAVENOUS | Status: DC

## 2019-03-26 NOTE — ED Notes (Signed)
Pt actively vomiting in restroom. This tech checked on pt and pt stated she was just giving a urine sample. Pt was not in distress at the time.

## 2019-03-26 NOTE — ED Triage Notes (Signed)
Patient reports multiple emesis for 3 days , denies diarrhea , no abdominal pain or fever , patient added chronic back pain for several months .

## 2019-03-26 NOTE — ED Notes (Signed)
Pt stated her tongue is swollen, was swollen before she arrived to ED, denies difficulty breathing, NAD.

## 2019-03-27 ENCOUNTER — Encounter (HOSPITAL_COMMUNITY): Payer: Self-pay | Admitting: Emergency Medicine

## 2019-03-27 ENCOUNTER — Emergency Department (HOSPITAL_COMMUNITY): Payer: Medicare Other

## 2019-03-27 LAB — URINALYSIS, ROUTINE W REFLEX MICROSCOPIC
Bacteria, UA: NONE SEEN
Bilirubin Urine: NEGATIVE
Glucose, UA: NEGATIVE mg/dL
Hgb urine dipstick: NEGATIVE
Ketones, ur: 5 mg/dL — AB
Leukocytes,Ua: NEGATIVE
Nitrite: NEGATIVE
Protein, ur: 30 mg/dL — AB
Specific Gravity, Urine: 1.033 — ABNORMAL HIGH (ref 1.005–1.030)
pH: 5 (ref 5.0–8.0)

## 2019-03-27 LAB — SARS CORONAVIRUS 2 (TAT 6-24 HRS): SARS Coronavirus 2: POSITIVE — AB

## 2019-03-27 LAB — I-STAT BETA HCG BLOOD, ED (MC, WL, AP ONLY): I-stat hCG, quantitative: <5 m[IU]/mL (ref <5)

## 2019-03-27 MED ORDER — SODIUM CHLORIDE 0.9 % IV BOLUS
500.0000 mL | Freq: Once | INTRAVENOUS | Status: AC
  Administered 2019-03-27: 500 mL via INTRAVENOUS

## 2019-03-27 MED ORDER — KETOROLAC TROMETHAMINE 15 MG/ML IJ SOLN
15.0000 mg | Freq: Once | INTRAMUSCULAR | Status: AC
  Administered 2019-03-27: 03:00:00 15 mg via INTRAVENOUS
  Filled 2019-03-27: qty 1

## 2019-03-27 MED ORDER — ONDANSETRON 8 MG PO TBDP: ORAL_TABLET | ORAL | 0 refills | Status: DC

## 2019-03-27 MED ORDER — ONDANSETRON 4 MG PO TBDP
4.0000 mg | ORAL_TABLET | Freq: Once | ORAL | Status: DC
  Filled 2019-03-27: qty 1

## 2019-03-27 MED ORDER — ONDANSETRON HCL 4 MG/2ML IJ SOLN
4.0000 mg | Freq: Once | INTRAMUSCULAR | Status: AC
  Administered 2019-03-27: 03:00:00 4 mg via INTRAVENOUS
  Filled 2019-03-27: qty 2

## 2019-03-27 NOTE — ED Notes (Signed)
Pt in restroom with dry heaves, says she has painful mouth, throat and neck areas since around 2000 tonight. Denies fevers. Pt has notable cough, says she has had a cough for "a couple of months". Offered Zofran.
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
## 2019-03-27 NOTE — ED Notes (Signed)
Patient transported to X-ray 

## 2019-03-27 NOTE — ED Notes (Signed)
Patient verbalizes understanding of discharge instructions. Opportunity for questioning and answers were provided. Armband removed by staff, pt discharged from ED. Pt. ambulatory and discharged home.  

## 2019-03-27 NOTE — Discharge Instructions (Signed)
Person Under Monitoring Name: [name]  Location: [address] [place] [address]   Infection Prevention Recommendations for Individuals Confirmed to have, or Being Evaluated for, 2019 Novel Coronavirus (COVID-19) Infection Who Receive Care at Home  Individuals who are confirmed to have, or are being evaluated for, COVID-19 should follow the prevention steps below until a healthcare provider or local or state health department says they can return to normal activities.  Stay home except to get medical care You should restrict activities outside your home, except for getting medical care. Do not go to work, school, or public areas, and do not use public transportation or taxis.  Call ahead before visiting your doctor Before your medical appointment, call the healthcare provider and tell them that you have, or are being evaluated for, COVID-19 infection. This will help the healthcare provider's office take steps to keep other people from getting infected. Ask your healthcare provider to call the local or state health department.  Monitor your symptoms Seek prompt medical attention if your illness is worsening (e.g., difficulty breathing). Before going to your medical appointment, call the healthcare provider and tell them that you have, or are being evaluated for, COVID-19 infection. Ask your healthcare provider to call the local or state health department.  Wear a facemask You should wear a facemask that covers your nose and mouth when you are in the same room with other people and when you visit a healthcare provider. People who live with or visit you should also wear a facemask while they are in the same room with you.  Separate yourself from other people in your home As much as possible, you should stay in a different room from other people in your home. Also, you should use a separate bathroom, if available.  Avoid sharing household items You should not  share dishes, drinking glasses, cups, eating utensils, towels, bedding, or other items with other people in your home. After using these items, you should wash them thoroughly with soap and water.  Cover your coughs and sneezes Cover your mouth and nose with a tissue when you cough or sneeze, or you can cough or sneeze into your sleeve. Throw used tissues in a lined trash can, and immediately wash your hands with soap and water for at least 20 seconds or use an alcohol-based hand rub.  Wash your [facility] your hands often and thoroughly with soap and water for at least 20 seconds. You can use an alcohol-based hand sanitizer if soap and water are not available and if your hands are not visibly dirty. Avoid touching your eyes, nose, and mouth with unwashed hands.   Prevention Steps for Caregivers and Household Members of Individuals Confirmed to have, or Being Evaluated for, COVID-19 Infection Being Cared for in the Home  If you live with, or provide care at home for, a person confirmed to have, or being evaluated for, COVID-19 infection please follow these guidelines to prevent infection:  Follow healthcare provider's instructions Make sure that you understand and can help the patient follow any healthcare provider instructions for all care.  Provide for the patient's basic needs You should help the patient with basic needs in the home and provide support for getting groceries, prescriptions, and other personal needs.  Monitor the patient's symptoms If they are getting sicker, call his or her medical provider and tell them that the patient has, or is being evaluated for, COVID-19 infection. This will help the healthcare provider's office  take steps to keep other people from getting infected. Ask the healthcare provider to call the local or state health department.  Limit the number of people who have contact with the patient If possible, have only one caregiver for the  patient. Other household members should stay in another home or place of residence. If this is not possible, they should stay in another room, or be separated from the patient as much as possible. Use a separate bathroom, if available. Restrict visitors who do not have an essential need to be in the home.  Keep older adults, very young children, and other sick people away from the patient Keep older adults, very young children, and those who have compromised immune systems or chronic health conditions away from the patient. This includes people with chronic heart, lung, or kidney conditions, diabetes, and cancer.  Ensure good ventilation Make sure that shared spaces in the home have good air flow, such as from an air conditioner or an opened window, weather permitting.  Wash your hands often Wash your hands often and thoroughly with soap and water for at least 20 seconds. You can use an alcohol based hand sanitizer if soap and water are not available and if your hands are not visibly dirty. Avoid touching your eyes, nose, and mouth with unwashed hands. Use disposable paper towels to dry your hands. If not available, use dedicated cloth towels and replace them when they become wet.  Wear a facemask and gloves Wear a disposable facemask at all times in the room and gloves when you touch or have contact with the patient's blood, body fluids, and/or secretions or excretions, such as sweat, saliva, sputum, nasal mucus, vomit, urine, or feces.  Ensure the mask fits over your nose and mouth tightly, and do not touch it during use. Throw out disposable facemasks and gloves after using them. Do not reuse. Wash your hands immediately after removing your facemask and gloves. If your personal clothing becomes contaminated, carefully remove clothing and launder. Wash your hands after handling contaminated clothing. Place all used disposable facemasks, gloves, and other waste in a lined container before  disposing them with other household waste. Remove gloves and wash your hands immediately after handling these items.  Do not share dishes, glasses, or other household items with the patient Avoid sharing household items. You should not share dishes, drinking glasses, cups, eating utensils, towels, bedding, or other items with a patient who is confirmed to have, or being evaluated for, COVID-19 infection. After the person uses these items, you should wash them thoroughly with soap and water.  Wash laundry thoroughly Immediately remove and wash clothes or bedding that have blood, body fluids, and/or secretions or excretions, such as sweat, saliva, sputum, nasal mucus, vomit, urine, or feces, on them. Wear gloves when handling laundry from the patient. Read and follow directions on labels of laundry or clothing items and detergent. In general, wash and dry with the warmest temperatures recommended on the label.  Clean all areas the individual has used often Clean all touchable surfaces, such as counters, tabletops, doorknobs, bathroom fixtures, toilets, phones, keyboards, tablets, and bedside tables, every day. Also, clean any surfaces that may have blood, body fluids, and/or secretions or excretions on them. Wear gloves when cleaning surfaces the patient has come in contact with. Use a diluted bleach solution (e.g., dilute bleach with 1 part bleach and 10 parts water) or a household disinfectant with a label that says EPA-registered for coronaviruses. To make a bleach  solution at home, add 1 tablespoon of bleach to 1 quart (4 cups) of water. For a larger supply, add  cup of bleach to 1 gallon (16 cups) of water. Read labels of cleaning products and follow recommendations provided on product labels. Labels contain instructions for safe and effective use of the cleaning product including precautions you should take when applying the product, such as wearing gloves or eye protection and making sure you  have good ventilation during use of the product. Remove gloves and wash hands immediately after cleaning.  Monitor yourself for signs and symptoms of illness Caregivers and household members are considered close contacts, should monitor their health, and will be asked to limit movement outside of the home to the extent possible. Follow the monitoring steps for close contacts listed on the symptom monitoring form.   ? If you have additional questions, contact your local health department or call the epidemiologist on call at [phone] (available 24/7). ? This guidance is subject to change. For the most up-to-date guidance from [facility], please refer to their website: [email or web address]

## 2019-03-28 ENCOUNTER — Telehealth (HOSPITAL_COMMUNITY): Payer: Self-pay

## 2019-04-26 ENCOUNTER — Other Ambulatory Visit: Payer: Self-pay | Admitting: Family Medicine

## 2019-04-26 DIAGNOSIS — E041 Nontoxic single thyroid nodule: Secondary | ICD-10-CM

## 2019-05-03 ENCOUNTER — Ambulatory Visit
Admission: RE | Admit: 2019-05-03 | Discharge: 2019-05-03 | Disposition: A | Discharge status: Home / Self Care | Payer: Medicare Other | Source: Ambulatory Visit | Attending: Family Medicine | Admitting: Family Medicine

## 2019-05-03 DIAGNOSIS — E041 Nontoxic single thyroid nodule: Secondary | ICD-10-CM

## 2019-08-18 ENCOUNTER — Encounter (HOSPITAL_COMMUNITY): Payer: Self-pay | Admitting: Emergency Medicine

## 2019-08-18 ENCOUNTER — Emergency Department (HOSPITAL_COMMUNITY): Payer: Medicaid Other

## 2019-08-18 ENCOUNTER — Other Ambulatory Visit: Payer: Self-pay

## 2019-08-18 ENCOUNTER — Emergency Department (HOSPITAL_COMMUNITY)
Admission: EM | Admit: 2019-08-18 | Discharge: 2019-08-18 | Disposition: A | Discharge status: Home / Self Care | Payer: Medicaid Other | Attending: Emergency Medicine | Admitting: Emergency Medicine

## 2019-08-18 DIAGNOSIS — M25562 Pain in left knee: Secondary | ICD-10-CM

## 2019-08-18 DIAGNOSIS — Y929 Unspecified place or not applicable: Secondary | ICD-10-CM | POA: Insufficient documentation

## 2019-08-18 DIAGNOSIS — Y939 Activity, unspecified: Secondary | ICD-10-CM | POA: Insufficient documentation

## 2019-08-18 DIAGNOSIS — Z87891 Personal history of nicotine dependence: Secondary | ICD-10-CM | POA: Insufficient documentation

## 2019-08-18 DIAGNOSIS — X500XXA Overexertion from strenuous movement or load, initial encounter: Secondary | ICD-10-CM | POA: Insufficient documentation

## 2019-08-18 DIAGNOSIS — Y999 Unspecified external cause status: Secondary | ICD-10-CM | POA: Insufficient documentation

## 2019-08-18 MED ORDER — NAPROXEN 250 MG PO TABS
500.0000 mg | ORAL_TABLET | Freq: Once | ORAL | Status: AC
  Administered 2019-08-18: 500 mg via ORAL
  Filled 2019-08-18: qty 2

## 2019-08-18 MED ORDER — OXYCODONE-ACETAMINOPHEN 5-325 MG PO TABS
1.0000 | ORAL_TABLET | Freq: Once | ORAL | Status: AC
  Administered 2019-08-18: 1 via ORAL
  Filled 2019-08-18: qty 1

## 2019-08-18 MED ORDER — LIDOCAINE 5 % EX PTCH: 1.0000 | MEDICATED_PATCH | CUTANEOUS | 0 refills | Status: DC

## 2019-08-18 MED ORDER — LIDOCAINE 5 % EX PTCH
1.0000 | MEDICATED_PATCH | CUTANEOUS | Status: DC
  Administered 2019-08-18: 1 via TRANSDERMAL
  Filled 2019-08-18: qty 1

## 2019-08-18 MED ORDER — NAPROXEN SODIUM 550 MG PO TABS: 550.0000 mg | ORAL_TABLET | Freq: Two times a day (BID) | ORAL | 0 refills | Status: DC
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
## 2019-08-18 NOTE — ED Triage Notes (Signed)
Patient reports persistent left knee pain radiating to upper/lower leg for several days unrelieved by OTC pain medication and prescription Robaxin .

## 2019-08-18 NOTE — ED Notes (Signed)
Patient verbalizes understanding of discharge instructions. Opportunity for questioning and answers were provided. Armband removed by staff, pt discharged from ED. Pt. ambulatory and discharged home.  

## 2019-08-21 IMAGING — DX DG ORTHOPANTOGRAM /PANORAMIC
1 series · 1 of 1 positions shown · non-contrast
Comparison: CT 09/14/2016.

CLINICAL DATA: Abscess.

EXAM:
ORTHOPANTOGRAM/PANORAMIC

[view not recorded]
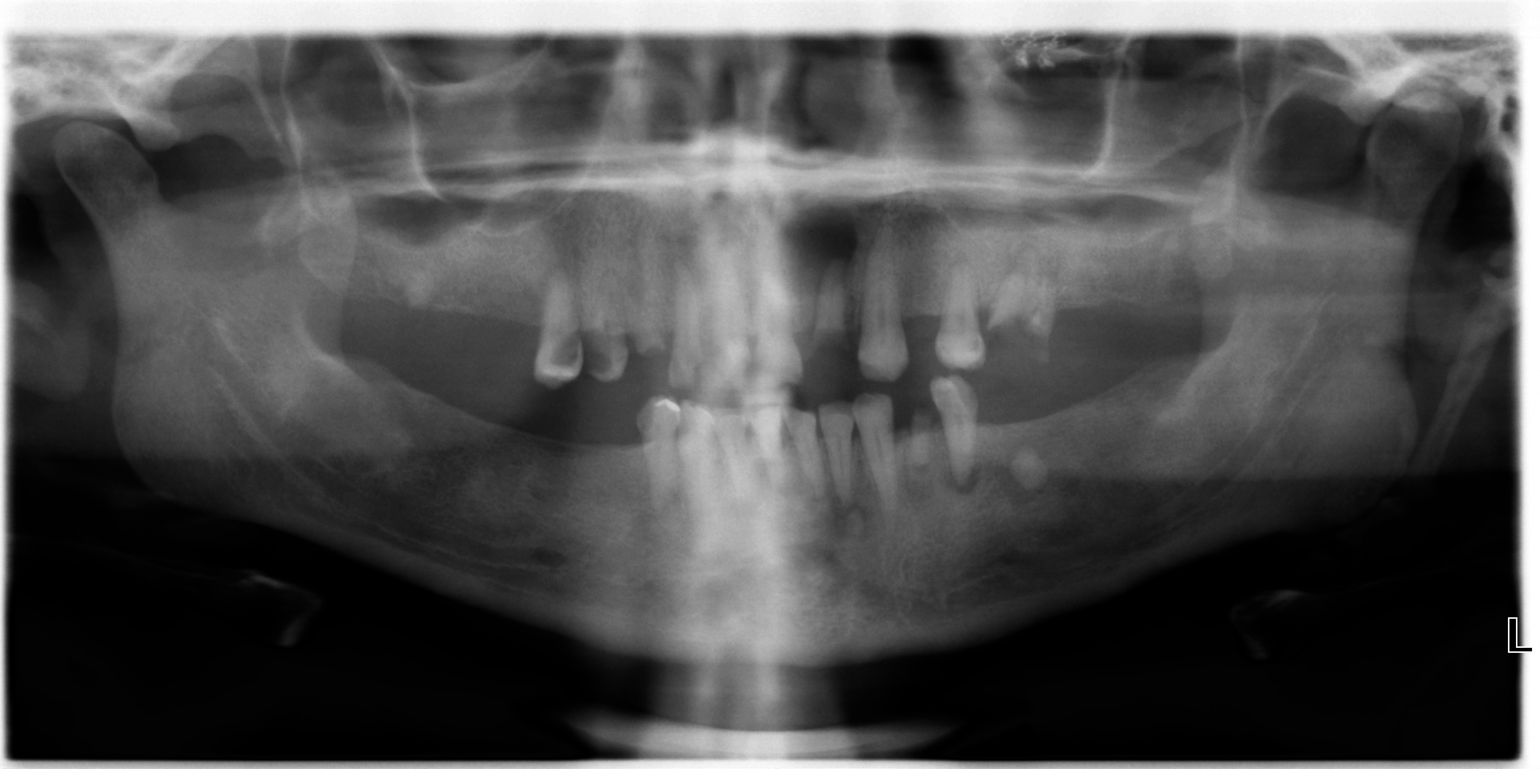

[1 of 1 positions shown; findings below may reference images not displayed]

FINDINGS: Severe dental caries noted throughout the remaining upper and lower
teeth. Multifocal periapical lucencies are also noted the largest of
which is along the left upper lateral incisor. These are consistent
periapical abscesses. Dental consultation suggested. No evidence of
fracture.
IMPRESSION: Severe multifocal dental caries throughout the remaining upper and
lower teeth. Multifocal periapical lucencies consistent with
periapical abscesses. A very prominent periapical lucency is noted
about the left upper lateral incisor consistent with a large
periapical abscess. Dental consultation suggested.

## 2019-08-22 ENCOUNTER — Other Ambulatory Visit (HOSPITAL_COMMUNITY): Payer: Self-pay | Admitting: Orthopaedic Surgery

## 2019-08-22 ENCOUNTER — Other Ambulatory Visit: Payer: Self-pay | Admitting: Orthopaedic Surgery

## 2019-08-22 DIAGNOSIS — M25562 Pain in left knee: Secondary | ICD-10-CM

## 2019-09-09 ENCOUNTER — Ambulatory Visit (HOSPITAL_COMMUNITY)
Admission: RE | Admit: 2019-09-09 | Discharge: 2019-09-09 | Disposition: A | Discharge status: Home / Self Care | Payer: Medicaid Other | Source: Ambulatory Visit | Attending: Orthopaedic Surgery | Admitting: Orthopaedic Surgery

## 2019-09-09 ENCOUNTER — Other Ambulatory Visit: Payer: Self-pay

## 2019-09-09 DIAGNOSIS — M25562 Pain in left knee: Secondary | ICD-10-CM | POA: Insufficient documentation

## 2020-03-13 ENCOUNTER — Emergency Department (HOSPITAL_COMMUNITY)
Admission: EM | Admit: 2020-03-13 | Discharge: 2020-03-14 | Disposition: A | Discharge status: Home / Self Care | Payer: Medicaid Other | Attending: Emergency Medicine | Admitting: Emergency Medicine

## 2020-03-13 ENCOUNTER — Emergency Department (HOSPITAL_COMMUNITY): Payer: Medicaid Other

## 2020-03-13 DIAGNOSIS — Z5321 Procedure and treatment not carried out due to patient leaving prior to being seen by health care provider: Secondary | ICD-10-CM | POA: Insufficient documentation

## 2020-03-13 DIAGNOSIS — Z20822 Contact with and (suspected) exposure to covid-19: Secondary | ICD-10-CM | POA: Insufficient documentation

## 2020-03-13 DIAGNOSIS — R079 Chest pain, unspecified: Secondary | ICD-10-CM | POA: Insufficient documentation

## 2020-03-13 DIAGNOSIS — R059 Cough, unspecified: Secondary | ICD-10-CM | POA: Insufficient documentation

## 2020-03-13 LAB — CBC
HCT: 34.7 % — ABNORMAL LOW (ref 36.0–46.0)
Hemoglobin: 11.3 g/dL — ABNORMAL LOW (ref 12.0–15.0)
MCH: 29.1 pg (ref 26.0–34.0)
MCHC: 32.6 g/dL (ref 30.0–36.0)
MCV: 89.4 fL (ref 80.0–100.0)
Platelets: 307 10*3/uL (ref 150–400)
RBC: 3.88 MIL/uL (ref 3.87–5.11)
RDW: 12.7 % (ref 11.5–15.5)
WBC: 6.4 10*3/uL (ref 4.0–10.5)
nRBC: 0 % (ref 0.0–0.2)

## 2020-03-13 LAB — BASIC METABOLIC PANEL
Anion gap: 9 (ref 5–15)
BUN: 10 mg/dL (ref 6–20)
CO2: 25 mmol/L (ref 22–32)
Calcium: 9 mg/dL (ref 8.9–10.3)
Chloride: 107 mmol/L (ref 98–111)
Creatinine, Ser: 0.82 mg/dL (ref 0.44–1.00)
GFR, Estimated: >60 mL/min (ref >60)
Glucose, Bld: 90 mg/dL (ref 70–99)
Potassium: 3.9 mmol/L (ref 3.5–5.1)
Sodium: 141 mmol/L (ref 135–145)

## 2020-03-13 LAB — TROPONIN I (HIGH SENSITIVITY)
Troponin I (High Sensitivity): <2 ng/L (ref <18)
Troponin I (High Sensitivity): 3 ng/L (ref <18)

## 2020-03-13 NOTE — ED Triage Notes (Signed)
Pt reports 3 days of cough, pain with deep inspiration in chest. Last neg COVID yesterday. Denies recent fever. VSS. NAD at present.

## 2020-03-13 NOTE — ED Notes (Signed)
Patient states she checked her mychart for her results. She states she will do an evisit with her primary care doctor in the morning but the wait was too long for her to stay.

## 2020-06-12 ENCOUNTER — Encounter (HOSPITAL_COMMUNITY): Payer: Self-pay

## 2020-06-12 ENCOUNTER — Other Ambulatory Visit: Payer: Self-pay

## 2020-06-12 ENCOUNTER — Ambulatory Visit (HOSPITAL_COMMUNITY)
Admission: EM | Admit: 2020-06-12 | Discharge: 2020-06-12 | Disposition: A | Discharge status: Home / Self Care | Payer: Medicaid Other | Attending: Emergency Medicine | Admitting: Emergency Medicine

## 2020-06-12 DIAGNOSIS — M549 Dorsalgia, unspecified: Secondary | ICD-10-CM

## 2020-06-12 MED ORDER — CYCLOBENZAPRINE HCL 10 MG PO TABS: 10.0000 mg | ORAL_TABLET | Freq: Two times a day (BID) | ORAL | 0 refills | Status: AC | PRN

## 2020-06-12 MED ORDER — IBUPROFEN 800 MG PO TABS: 800.0000 mg | ORAL_TABLET | Freq: Three times a day (TID) | ORAL | 0 refills | Status: AC | PRN
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
## 2020-06-12 NOTE — ED Triage Notes (Signed)
Pt c/o pain that starts at her neck and goes down to her lower back. Pt states she feel the pain is directly on the spine. She states the pain started yesterday.

## 2020-06-12 NOTE — Discharge Instructions (Signed)
Take ibuprofen as needed for discomfort.  Take the muscle relaxer as needed for muscle spasm; Do not drive, operate machinery, or drink alcohol with this medication as it can cause drowsiness.   Follow up with your primary care provider or an orthopedist if your symptoms are not improving.     

## 2020-09-07 IMAGING — CR DG CHEST 2V
2 series · 2 of 2 positions shown · non-contrast
Comparison: 09/14/2016

CLINICAL DATA: Back pain for 3 months.  Cough for 1 month.

EXAM:
CHEST - 2 VIEW

[chest pa]
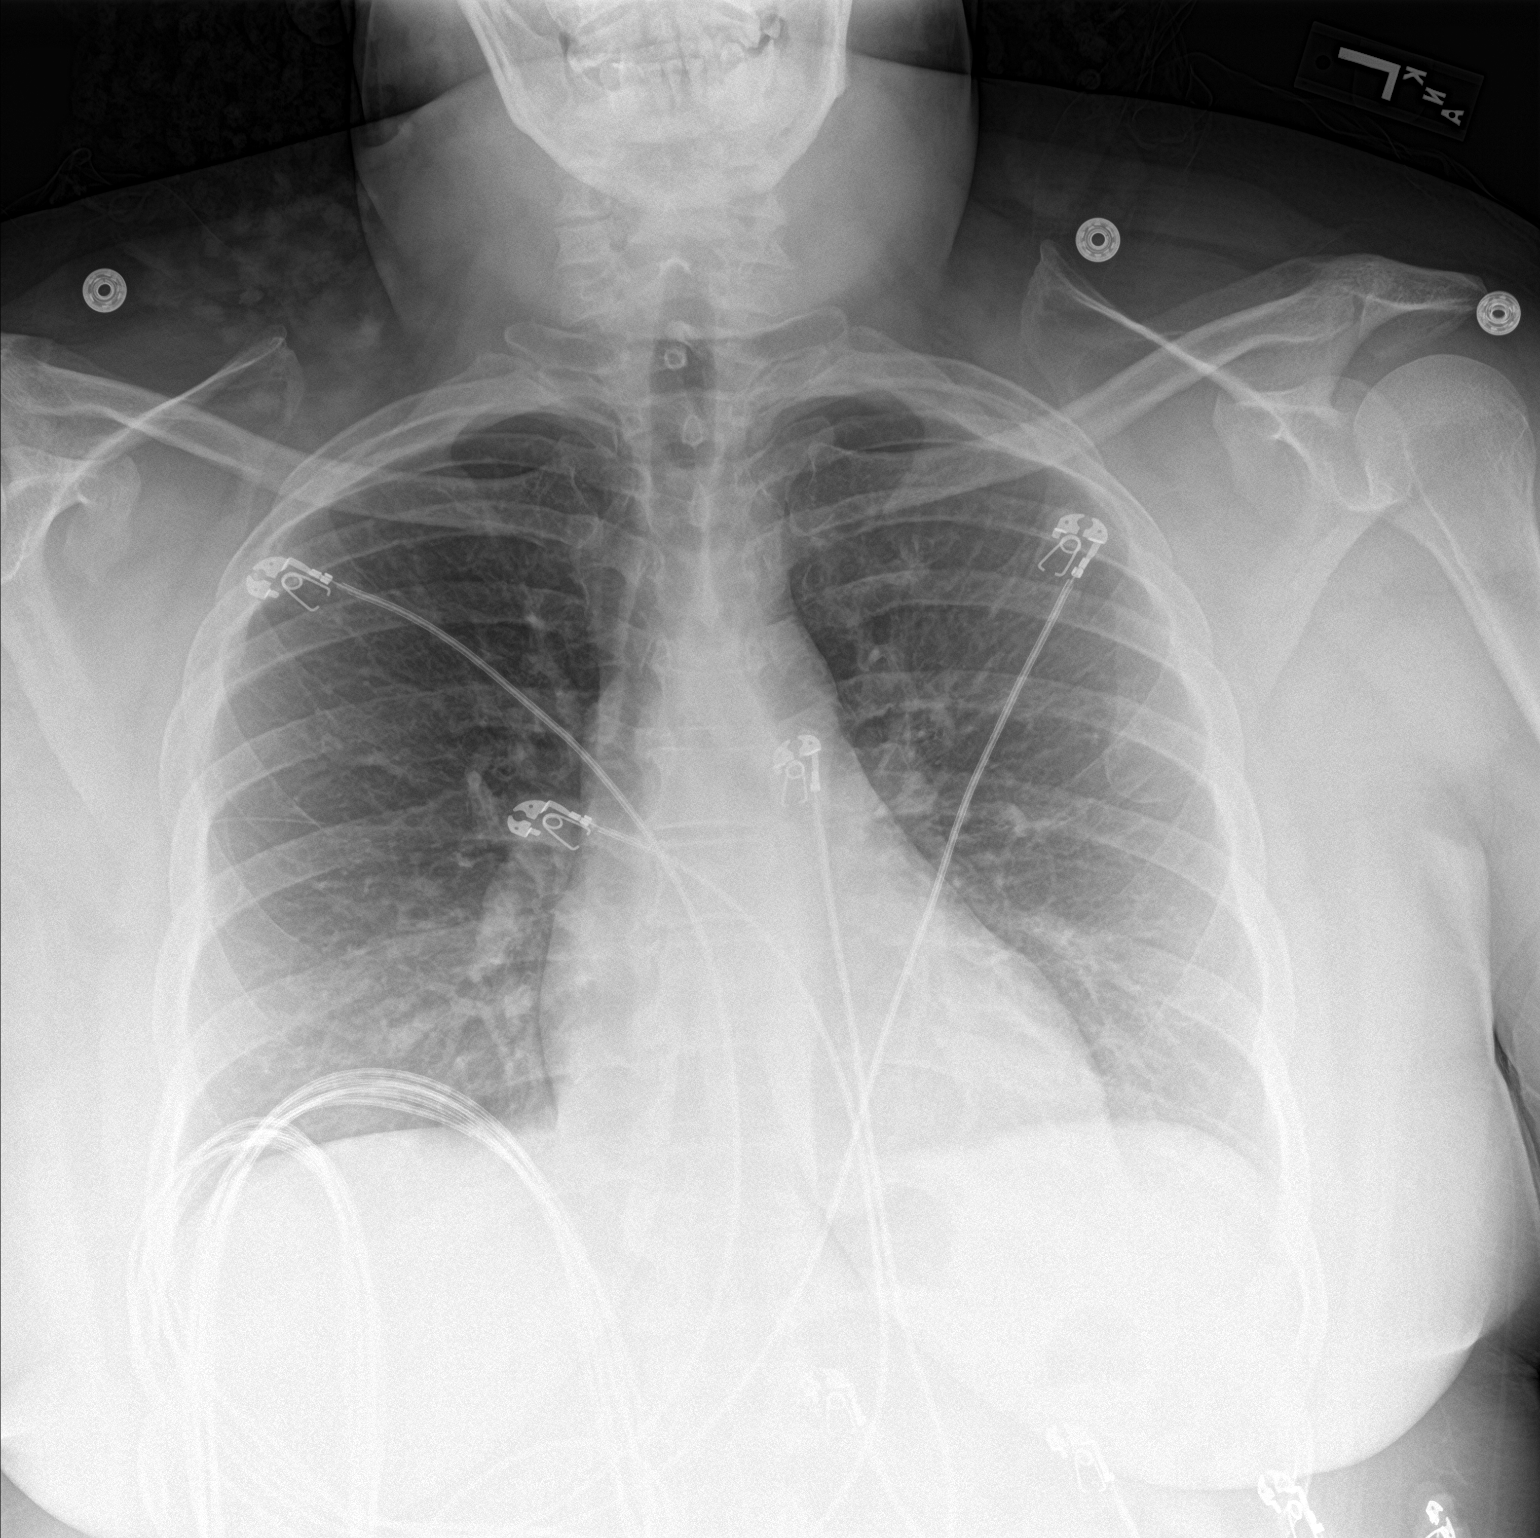

[chest lat]
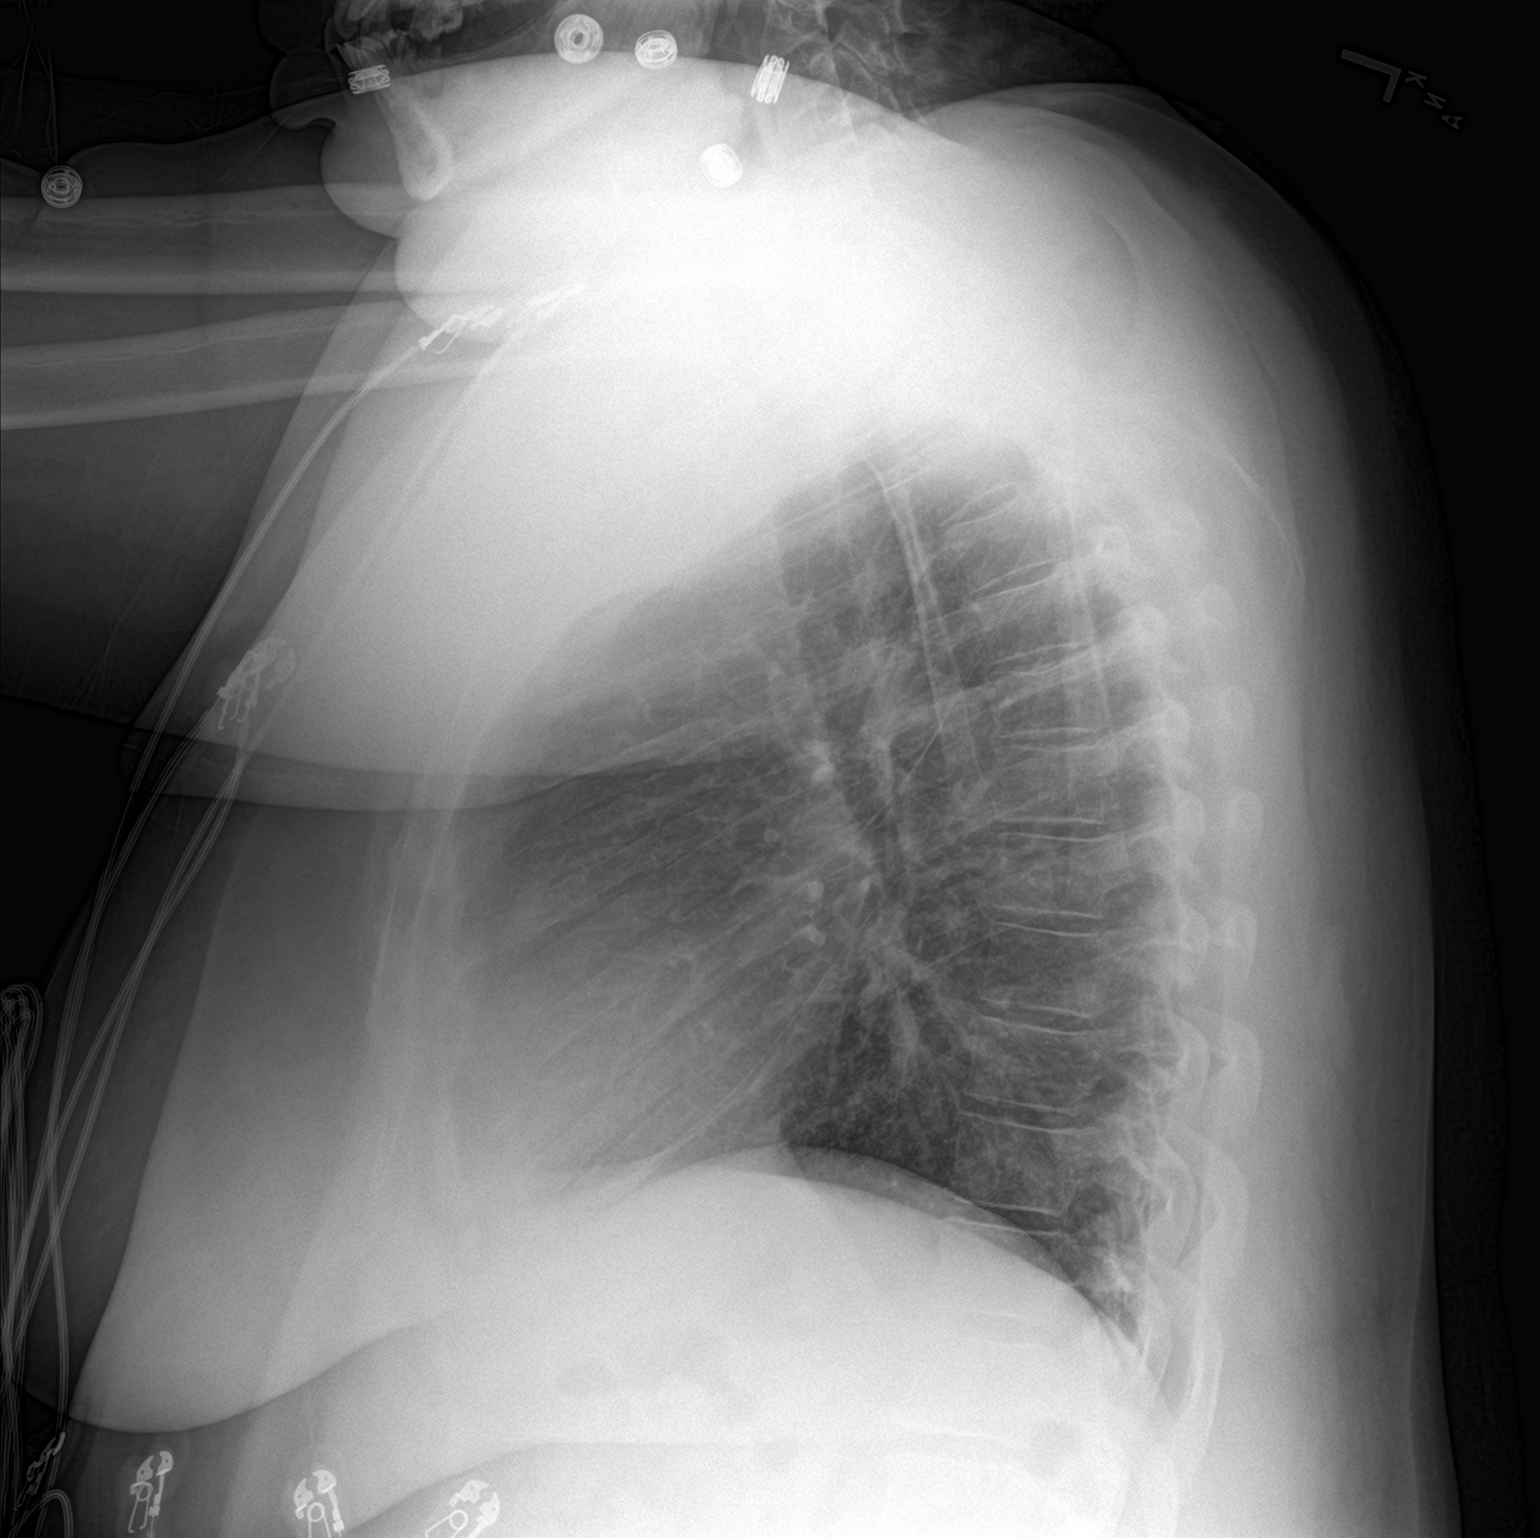

[2 of 2 positions shown; findings below may reference images not displayed]

FINDINGS: The heart size and mediastinal contours are within normal limits.
Both lungs are clear. The visualized skeletal structures are
unremarkable.
IMPRESSION: No active cardiopulmonary disease.

## 2020-09-07 IMAGING — CR DG LUMBAR SPINE COMPLETE 4+V
5 series · 5 of 5 positions shown · non-contrast
Comparison: CT AP 06/27/2016, radiographs 04/20/2014

CLINICAL DATA: Back pain for 3 months.

EXAM:
LUMBAR SPINE - COMPLETE 4+ VIEW

[l-spine ap]
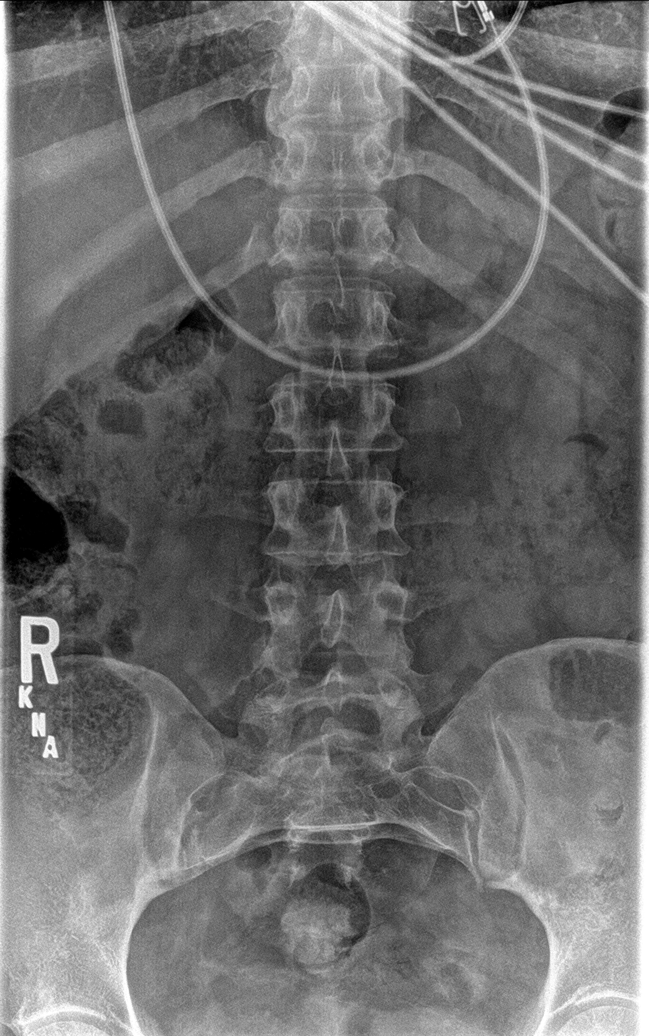

[l-spine obl (1 of 2)]
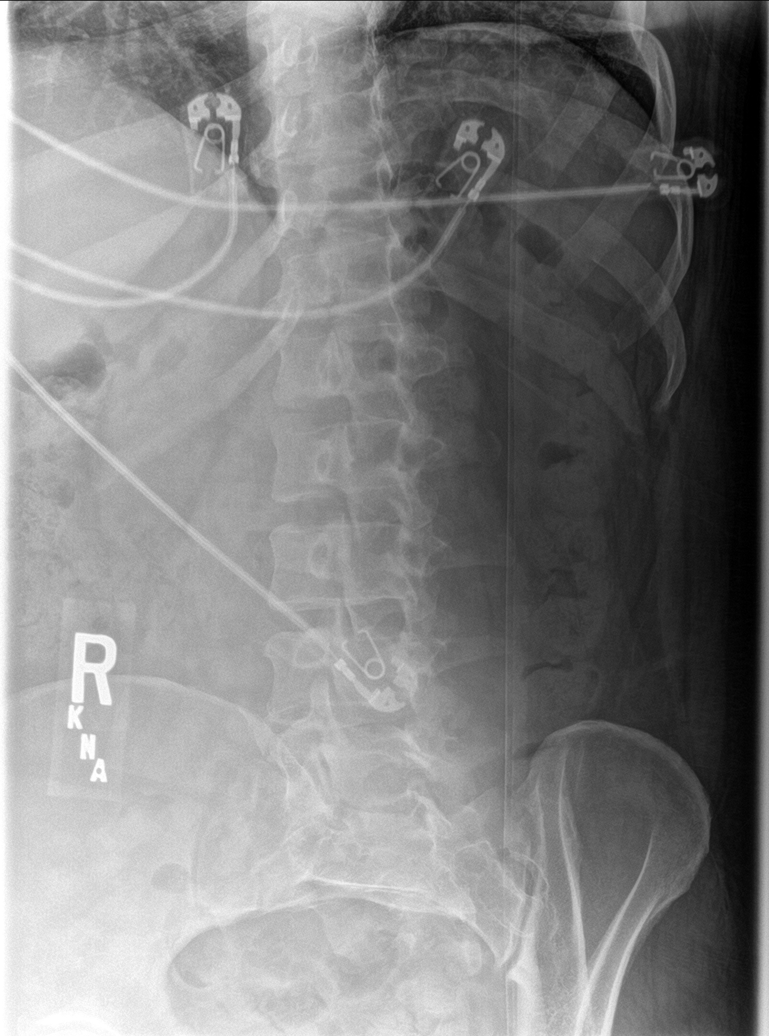

[l-spine obl (2 of 2)]
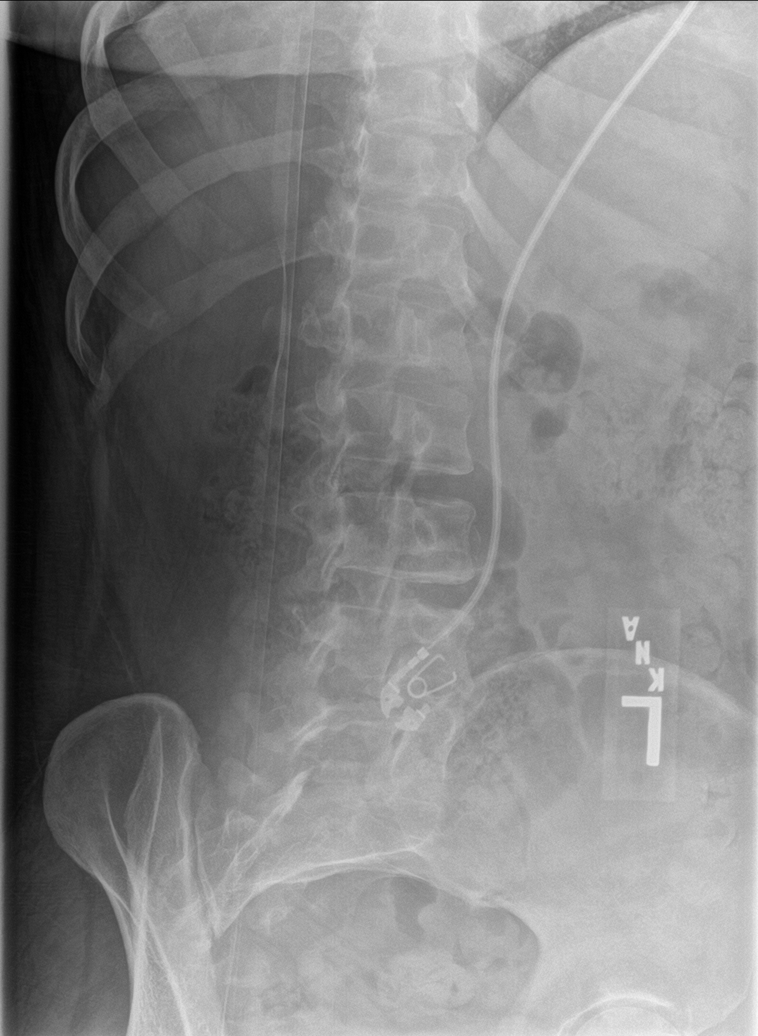

[l-spine lat]
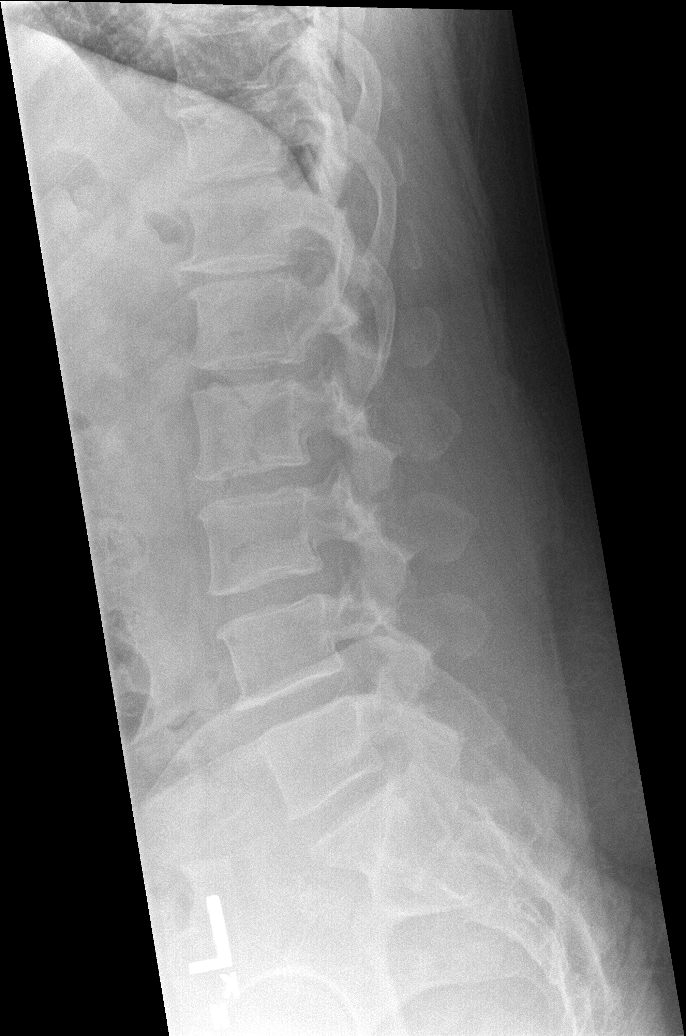

[l-spine spot]
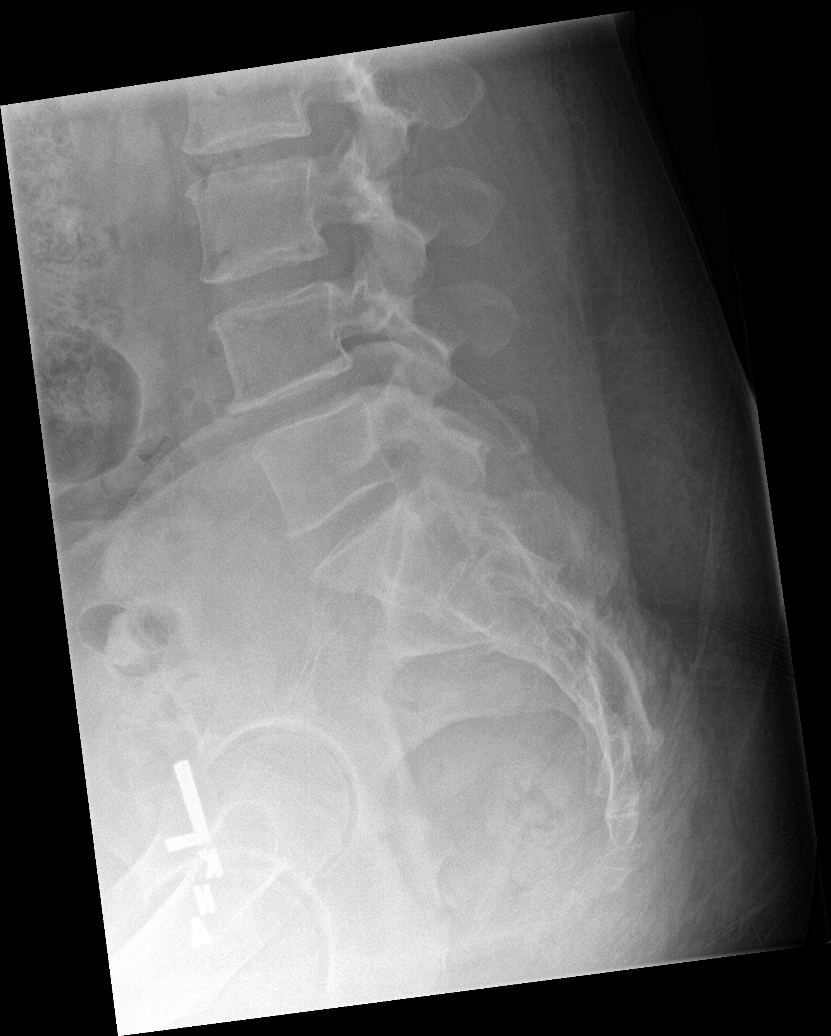

[5 of 5 positions shown; findings below may reference images not displayed]

FINDINGS: There are 5 non ribbed lumbar type vertebral bodies in maintained
lumbar lordosis. Minimal anterior osteophytes are noted off the
endplates of T12, L1, L3 and L4. No significant disc flattening,
pars defects or listhesis. No acute fracture or suspicious osseous
lesions. The included sacroiliac joints are maintained without
abnormal fusion or erosion.
IMPRESSION: No acute nor significant osseous abnormality of the lumbar spine.
Stable radiographic appearance of the lumbar spine.

## 2021-04-22 IMAGING — CT CT ABD-PELV W/ CM
2 of 5 series · 17 of 46 positions shown, 19 images · IV contrast (Omni 300)
Comparison: 06/27/2016

CLINICAL DATA: Abdominal pain with diverticulitis suspected

EXAM:
CT ABDOMEN AND PELVIS WITH CONTRAST
TECHNIQUE: Multidetector CT imaging of the abdomen and pelvis was performed
using the standard protocol following bolus administration of
intravenous contrast.
CONTRAST:  100mL OMNIPAQUE IOHEXOL 300 MG/ML  SOLN

[Series 3: a/p w/ 5mm · axial · 0.88mm/px · z∈[+877,+1302]mm · 14 of 96 slices shown, 16 images]
[im 6/96  soft-tissue]
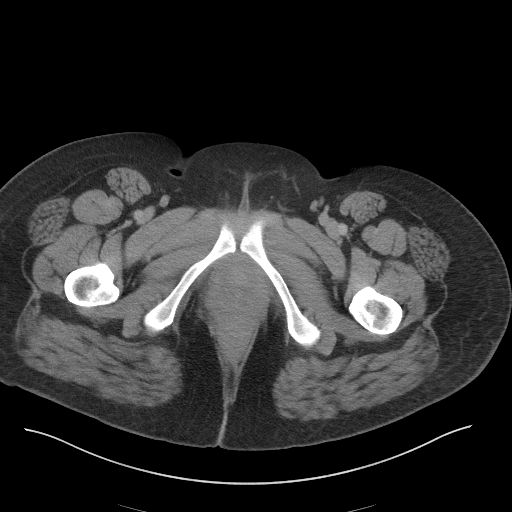
[im 6/96  bone]
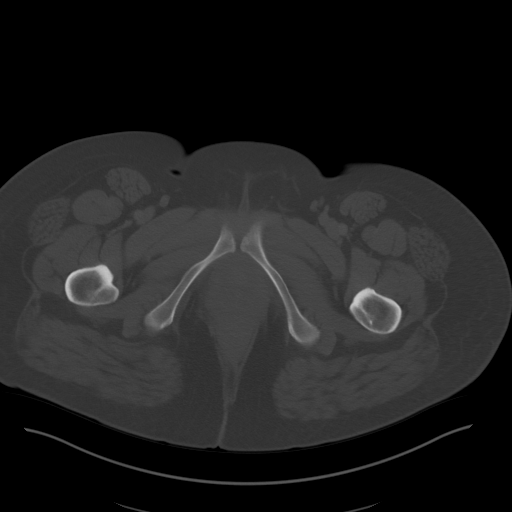
[im 11/96  soft-tissue]
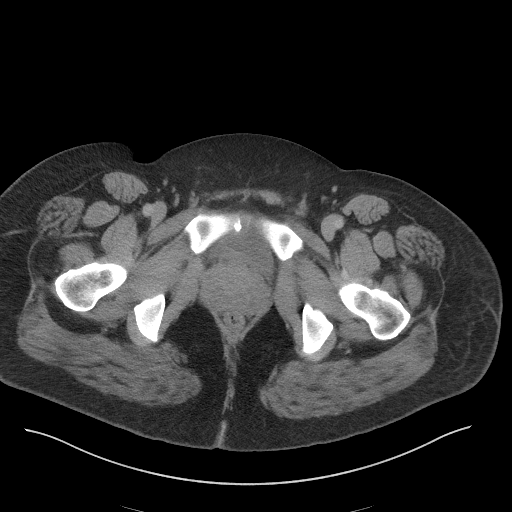
[im 21/96  soft-tissue]
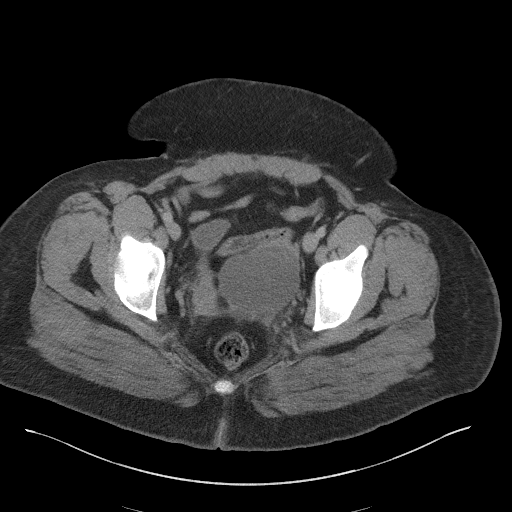
[im 26/96  soft-tissue]
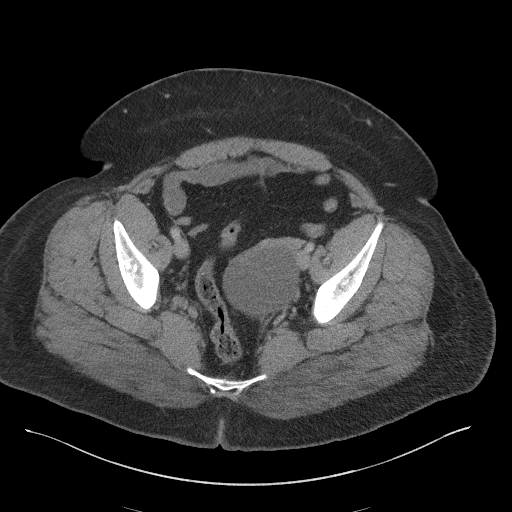
[im 31/96  soft-tissue]
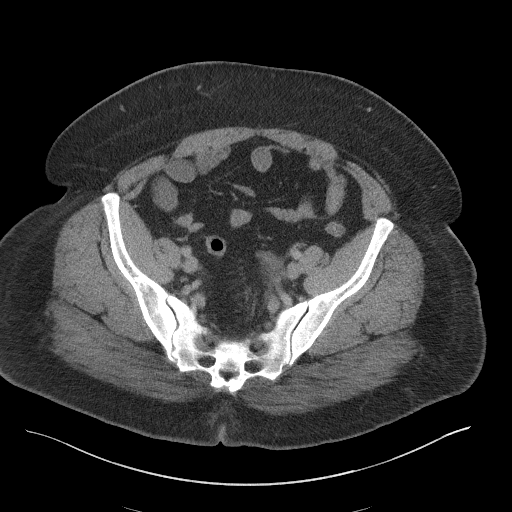
[im 41/96  soft-tissue]
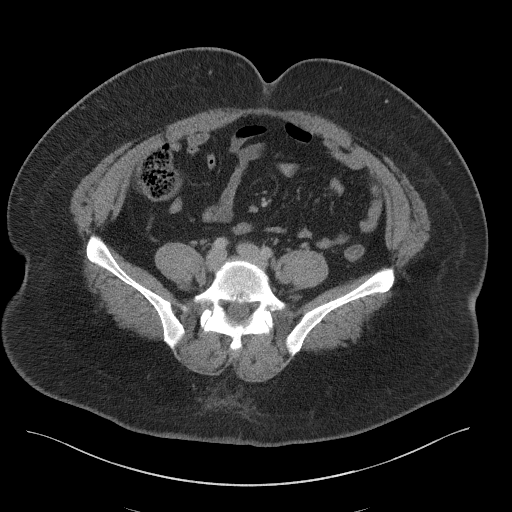
[im 46/96  soft-tissue]
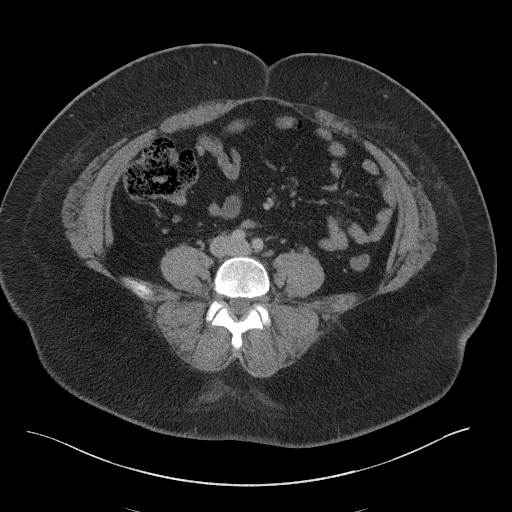
[im 51/96  soft-tissue]
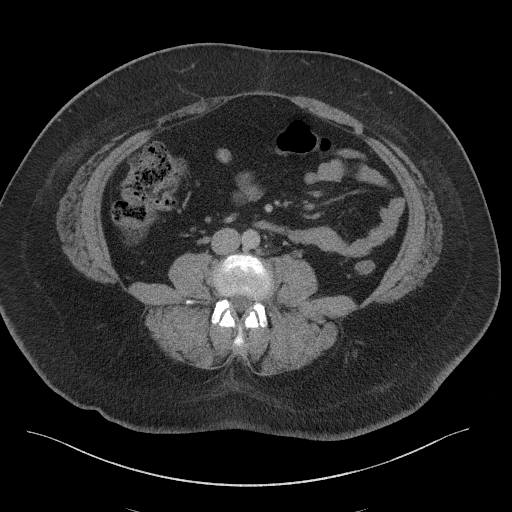
[im 56/96  soft-tissue]
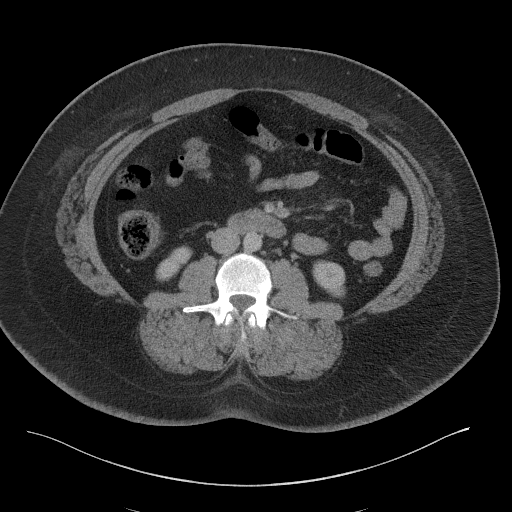
[im 56/96  bone]
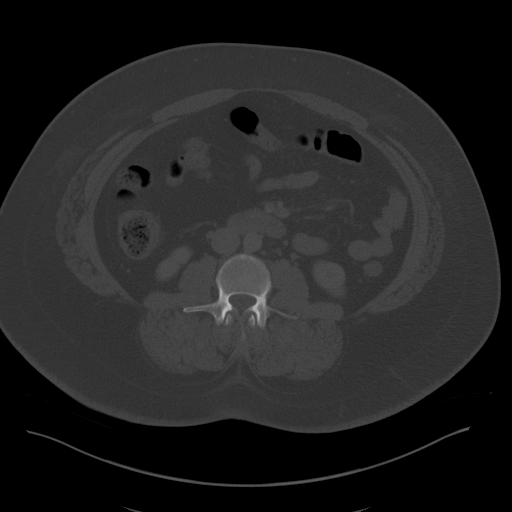
[im 66/96  soft-tissue]
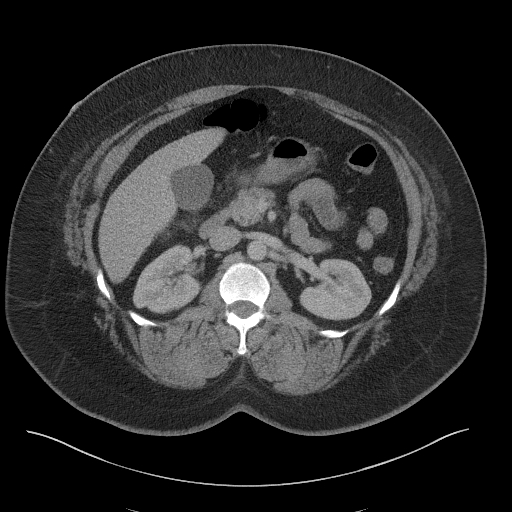
[im 71/96  soft-tissue]
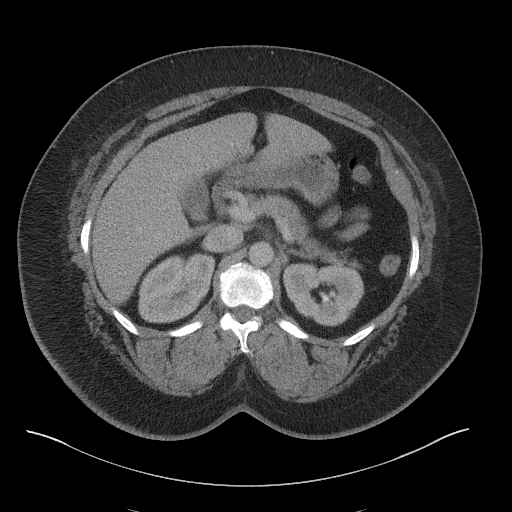
[im 76/96  soft-tissue]
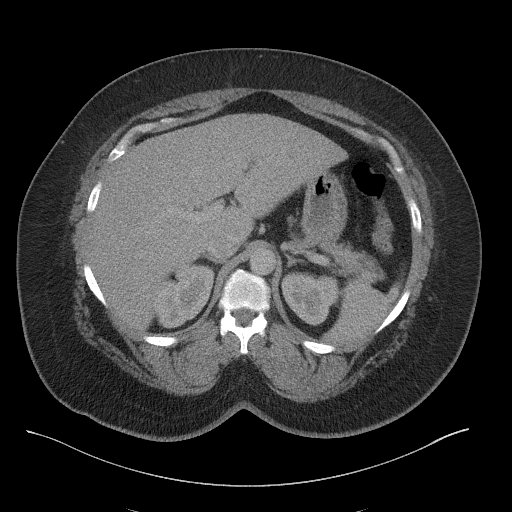
[im 86/96  soft-tissue]
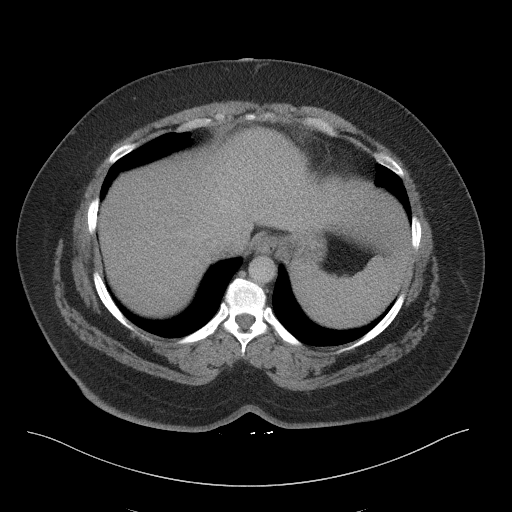
[im 91/96  soft-tissue]
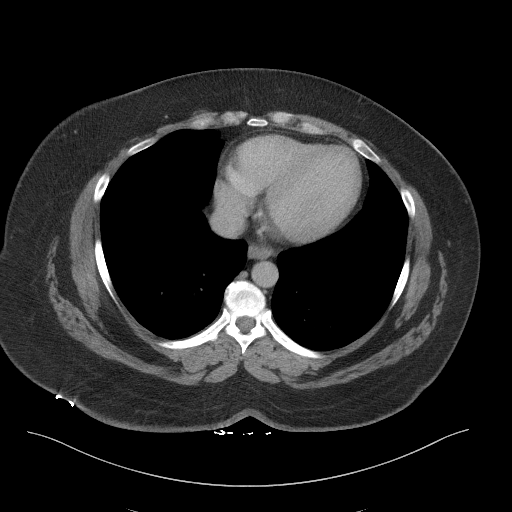

[Series 6: a/p w/ cor · coronal · 0.77mm/px · 3 of 142 slices shown]
[im 48/142  soft-tissue]
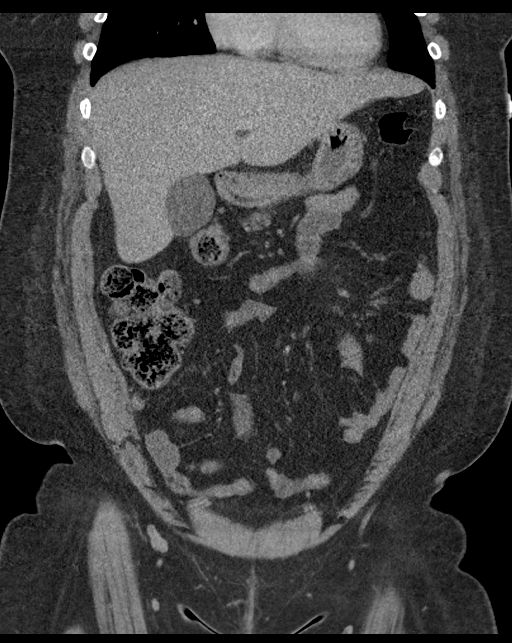
[im 63/142  soft-tissue]
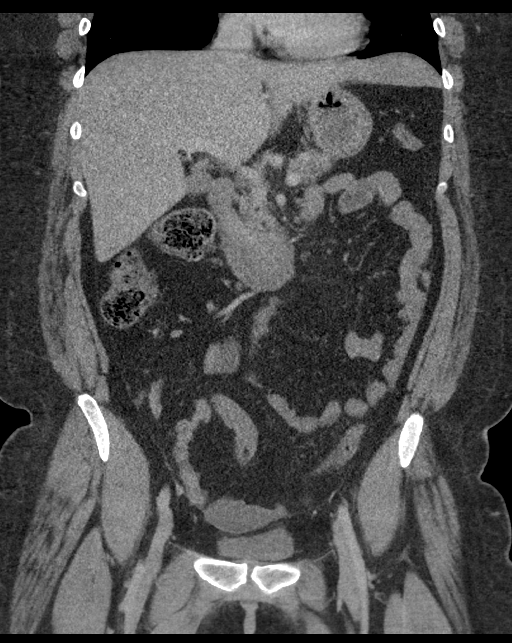
[im 79/142  soft-tissue]
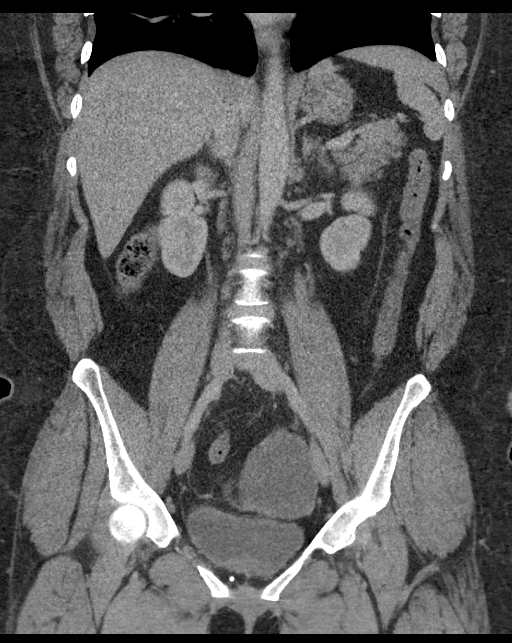

[17 of 46 positions shown; findings below may reference images not displayed]

FINDINGS: Lower chest:  No contributory findings.

Hepatobiliary: No focal liver abnormality.No evidence of biliary
obstruction or stone.

Pancreas: Unremarkable.

Spleen: Unremarkable.

Adrenals/Urinary Tract: Negative adrenals. No hydronephrosis or
stone. Unremarkable bladder.

Stomach/Bowel:  No obstruction. No pericecal inflammation.

Vascular/Lymphatic: No acute vascular abnormality. No mass or
adenopathy.

Reproductive:8 cm cystic density left adnexal mass associated with
the left ovary which itself does not appear thickened or edematous.
There may be a septation. Hysterectomy.

Other: No ascites or pneumoperitoneum.

Musculoskeletal: No acute abnormalities.
IMPRESSION: 1. 8 cm left adnexal cystic mass, recommend pelvic ultrasound
follow-up.
2. No diverticulosis or diverticulitis.

## 2021-04-22 IMAGING — US US PELVIS COMPLETE WITH TRANSVAGINAL
1 series · 13 of 25 positions shown · non-contrast
Comparison: CT of the abdomen and pelvis on 11/19/2018

CLINICAL DATA: LEFT adnexal mass seen on CT exam 11/19/2018.
LEFT-sided abdominal pain for 2 weeks. Previous hysterectomy.

EXAM:
TRANSABDOMINAL AND TRANSVAGINAL ULTRASOUND OF PELVIS
TECHNIQUE: Both transabdominal and transvaginal ultrasound examinations of the
pelvis were performed. Transabdominal technique was performed for
global imaging of the pelvis including uterus, ovaries, adnexal
regions, and pelvic cul-de-sac. It was necessary to proceed with
endovaginal exam following the transabdominal exam to visualize the
vaginal cuff and adnexal regions.

[Series 1: us pelvis complete with transvaginal · 58 acquisitions, 13 frames shown]
[im 1/58]
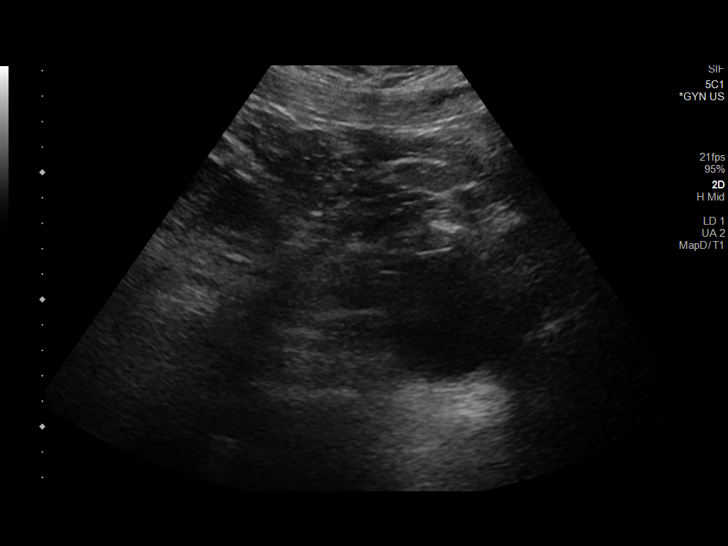
[im 5/58]
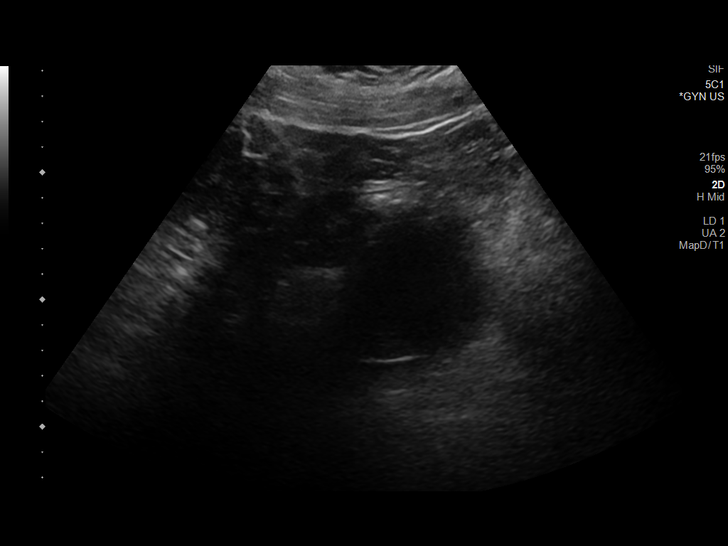
[im 10/58]
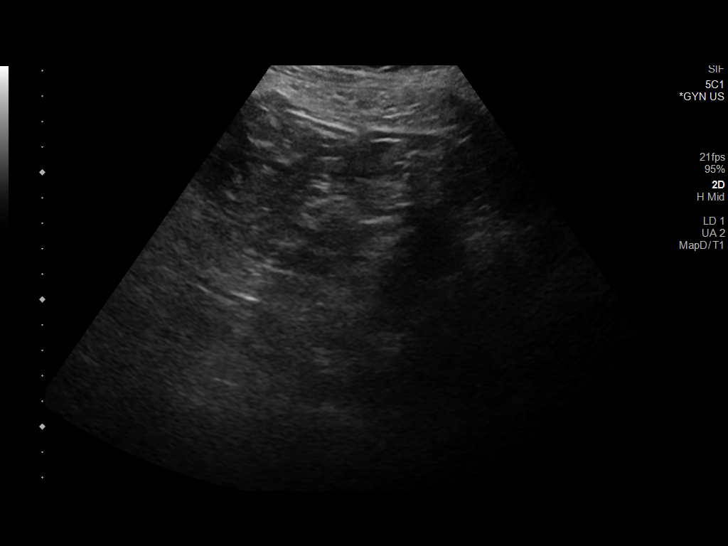
[im 15/58]
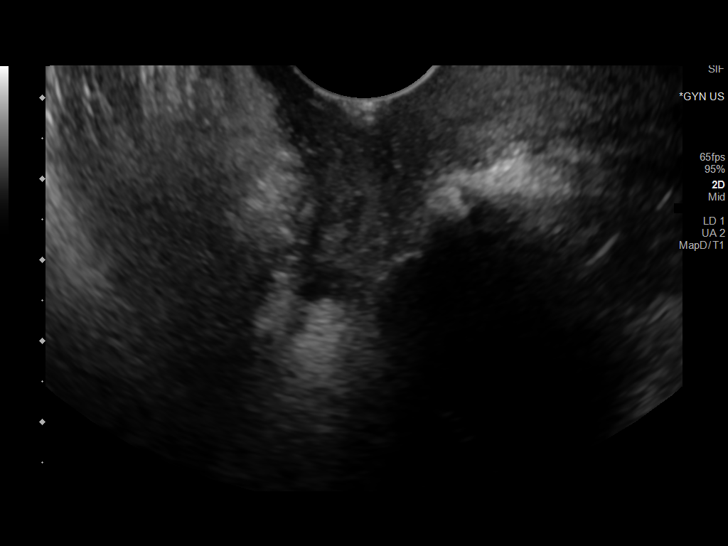
[im 20/58]
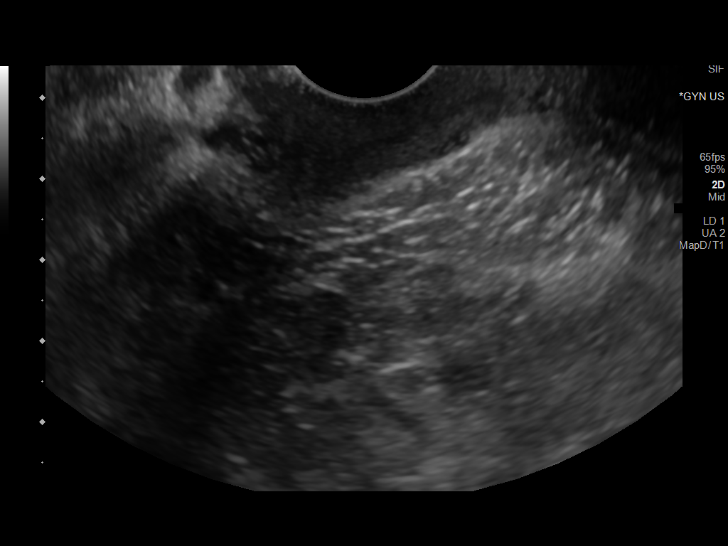
[im 24/58]
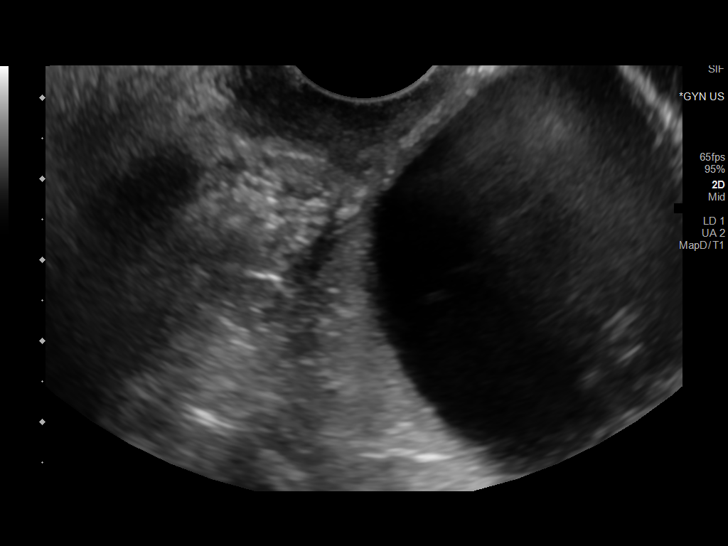
[im 29/58]
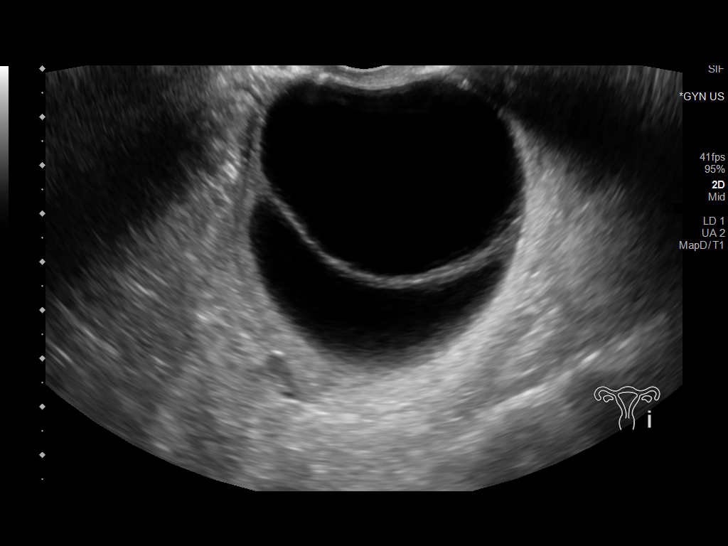
[im 34/58]
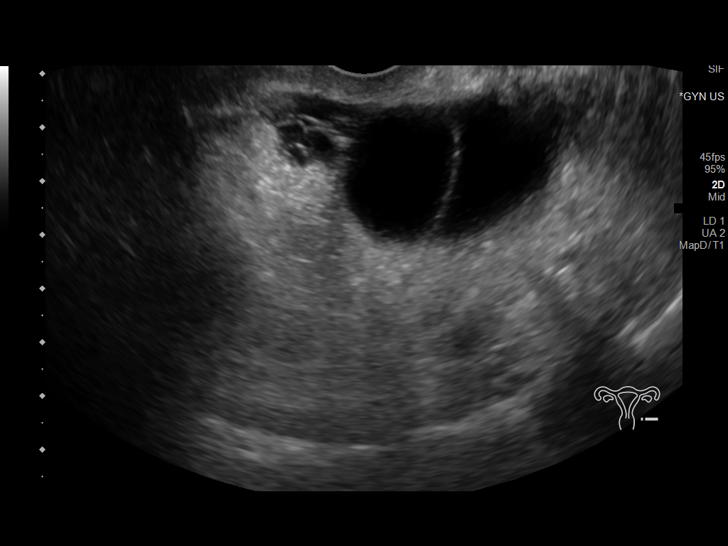
[im 39/58]
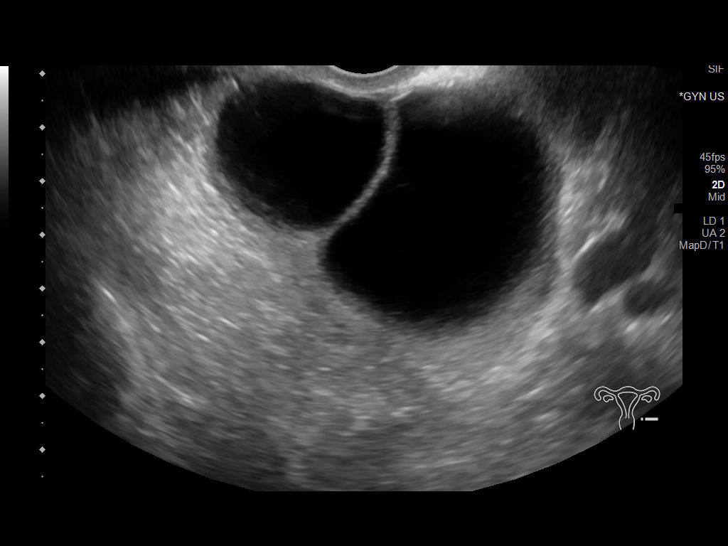
[im 43/58]
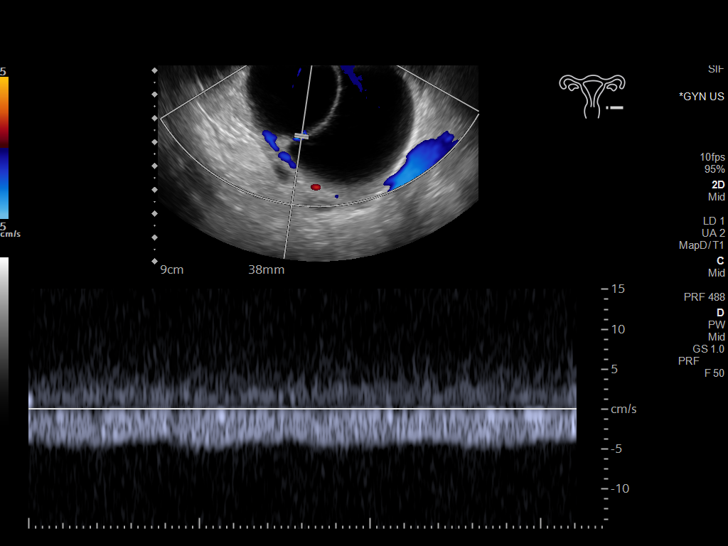
[im 48/58]
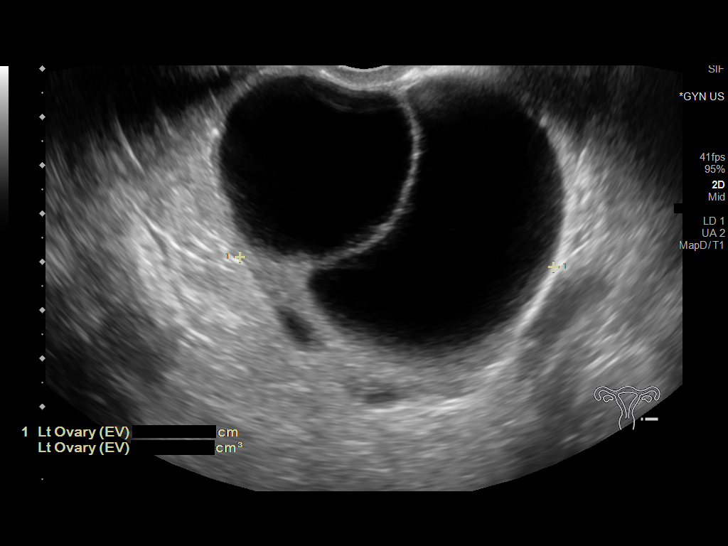
[im 53/58]
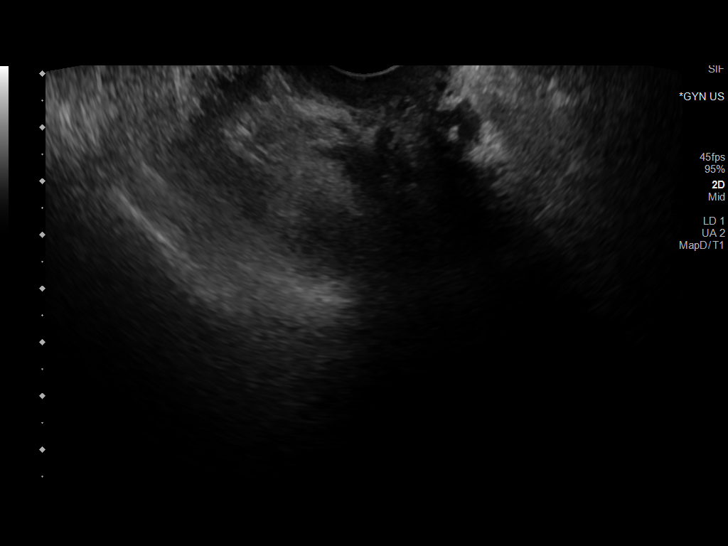
[im 58/58]
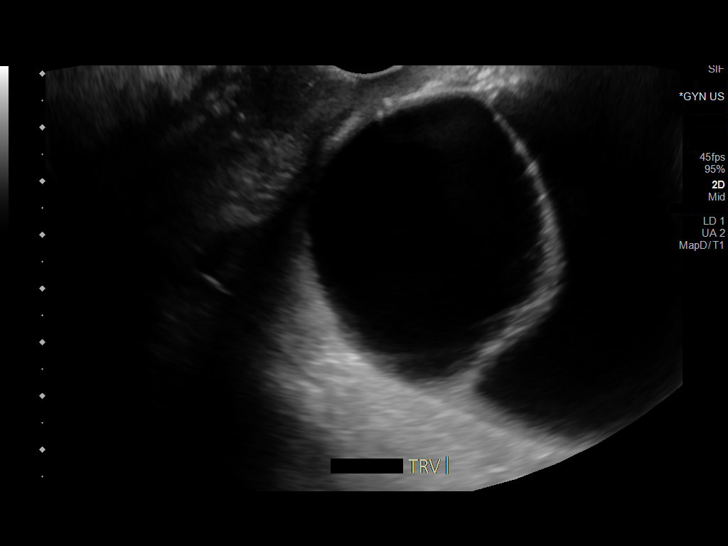

[13 of 25 positions shown; findings below may reference images not displayed]

FINDINGS: Uterus

Measurements: Surgically absent.  Vaginal cuff is unremarkable.

Endometrium

Thickness: Surgically absent uterus.

Right ovary

Measurements: Surgically absent.

Left ovary

Measurements: Within the LEFT adnexal region there is a large
complex cystic mass, likely within the LEFT ovary. LEFT ovary
measures 7.9 x 7.7 X 7.3 Centimeters. Mass measures 7.5 x 6.4 x
centimeters. A single septation traverses this mass and demonstrates
internal blood flow. No solid components or mural nodules
identified.

Other findings

No abnormal free fluid. Study quality is degraded by patient body
habitus.
IMPRESSION: 1. Cystic mass with internal septation in the LEFT adnexa, likely
within the LEFT ovary.
2. Venous and arterial blood flow are identified within the LEFT
ovary, excluding complete torsion at this time.
3. Recommend ultrasound follow-up in 6-8 weeks to assess the size of
the lesion. Neoplasm has not been excluded.
4. Status post hysterectomy and RIGHT oophorectomy.

## 2021-05-28 ENCOUNTER — Other Ambulatory Visit: Payer: Self-pay | Admitting: Nurse Practitioner

## 2021-05-28 DIAGNOSIS — Z1231 Encounter for screening mammogram for malignant neoplasm of breast: Secondary | ICD-10-CM

## 2021-06-17 ENCOUNTER — Other Ambulatory Visit: Payer: Self-pay | Admitting: Nurse Practitioner

## 2021-06-17 DIAGNOSIS — E049 Nontoxic goiter, unspecified: Secondary | ICD-10-CM

## 2021-06-19 ENCOUNTER — Ambulatory Visit
Admission: RE | Admit: 2021-06-19 | Discharge: 2021-06-19 | Disposition: A | Discharge status: Home / Self Care | Payer: No Typology Code available for payment source | Source: Ambulatory Visit | Attending: Nurse Practitioner | Admitting: Nurse Practitioner

## 2021-06-19 DIAGNOSIS — Z1231 Encounter for screening mammogram for malignant neoplasm of breast: Secondary | ICD-10-CM

## 2021-06-22 IMAGING — US US TRANSVAGINAL NON-OB
1 series · 15 of 25 positions shown · non-contrast
Comparison: 11/19/2018

CLINICAL DATA: LEFT ovarian mass

EXAM:
ULTRASOUND PELVIS TRANSVAGINAL
TECHNIQUE: Transvaginal ultrasound examination of the pelvis was performed
including evaluation of the uterus, ovaries, adnexal regions, and
pelvic cul-de-sac.

[Series 1: us transvaginal non-ob · 37 acquisitions, 15 frames shown]
[im 1/37]
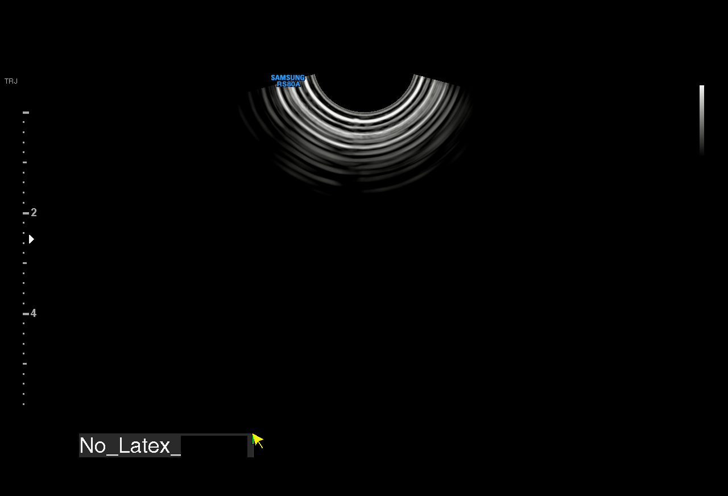
[im 4/37]
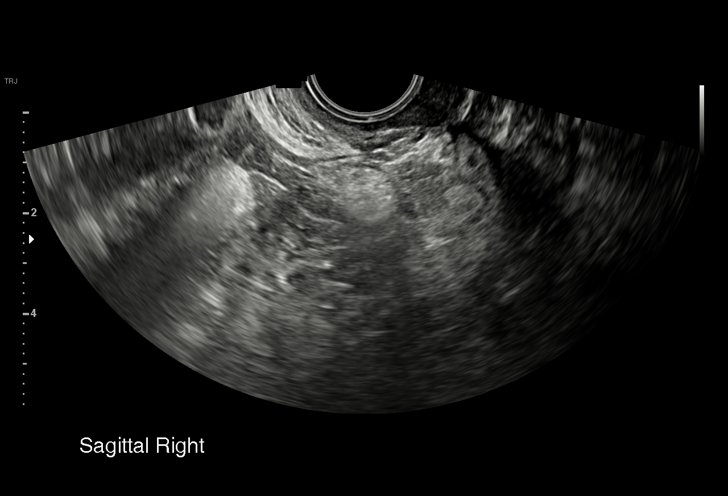
[im 7/37]
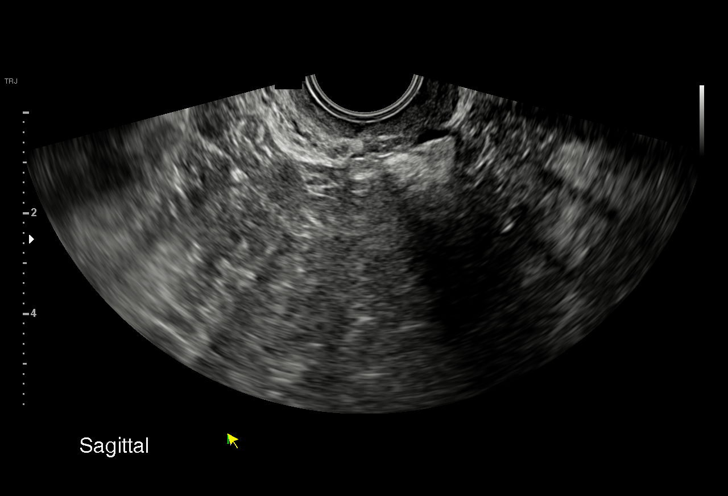
[im 8/37]
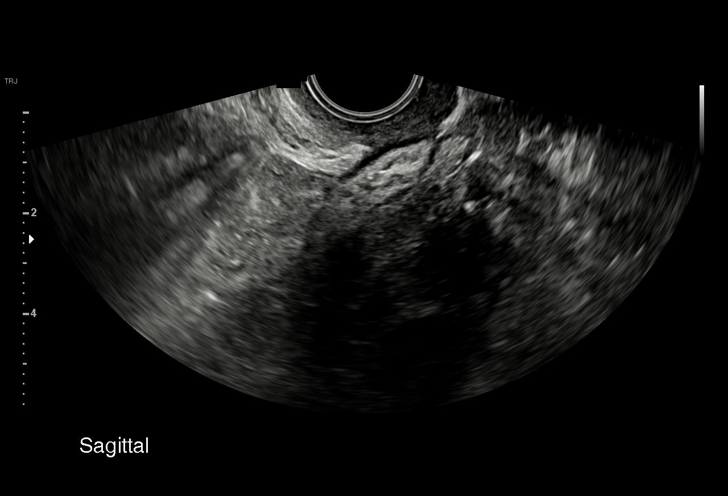
[im 11/37]
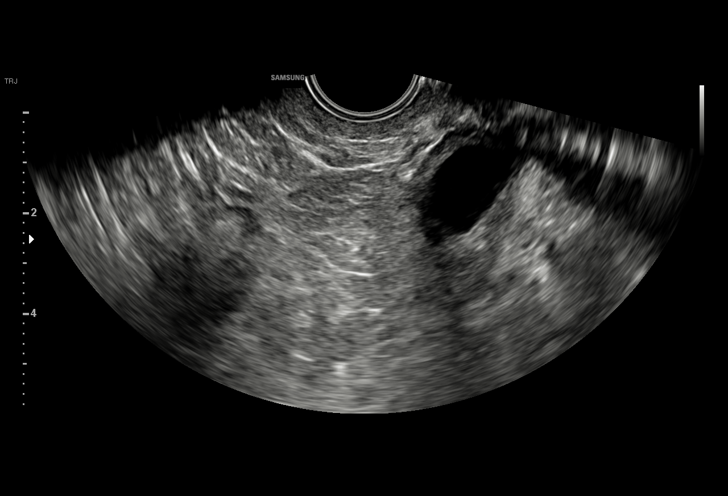
[im 14/37]
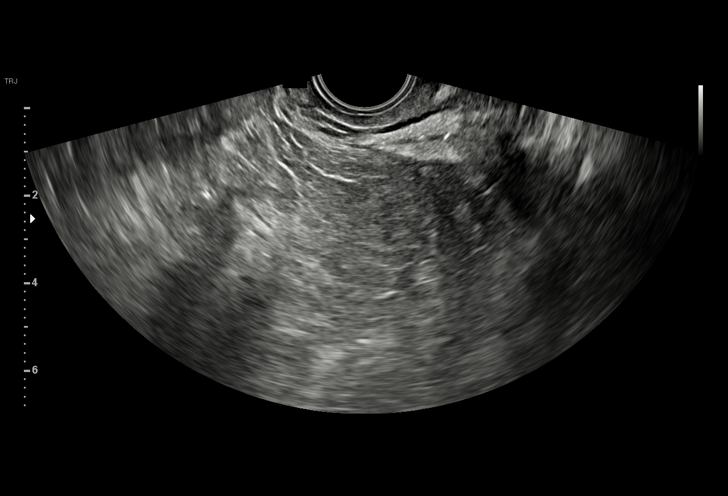
[im 16/37]
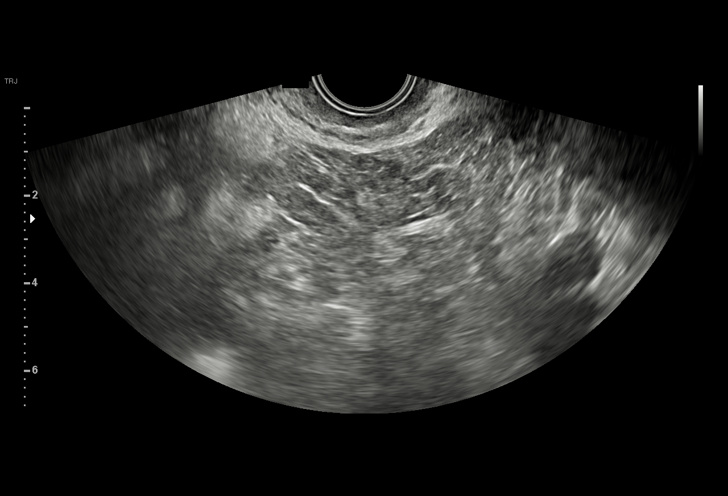
[im 19/37]
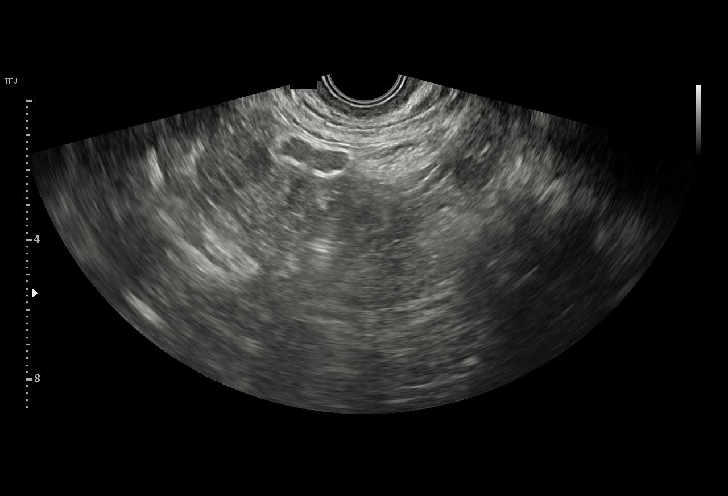
[im 22/37]
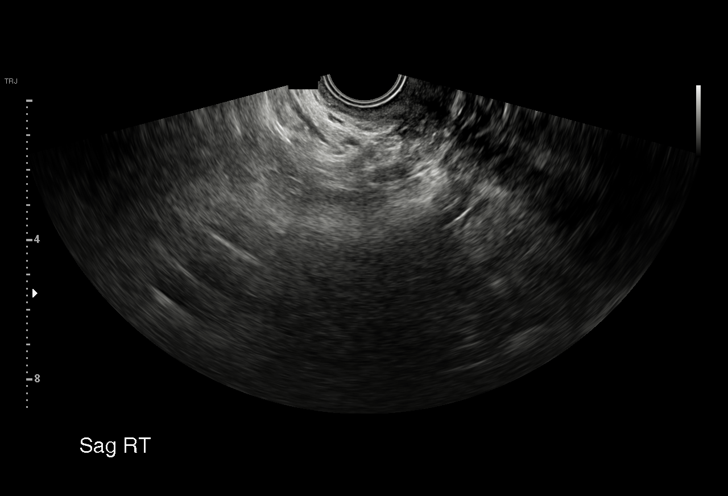
[im 23/37]
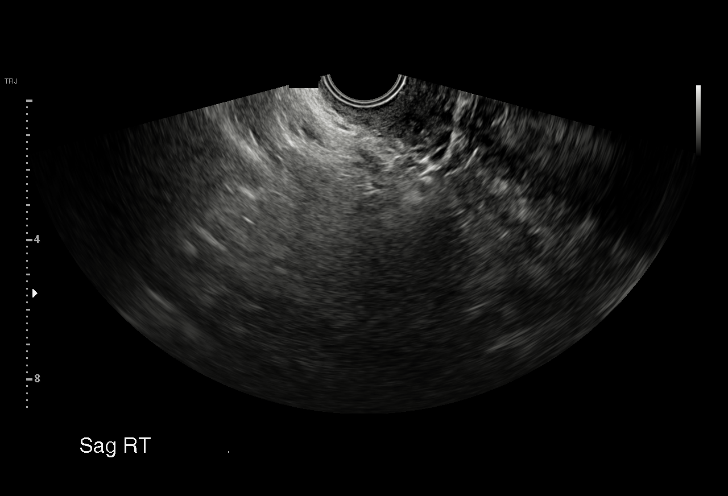
[im 26/37]
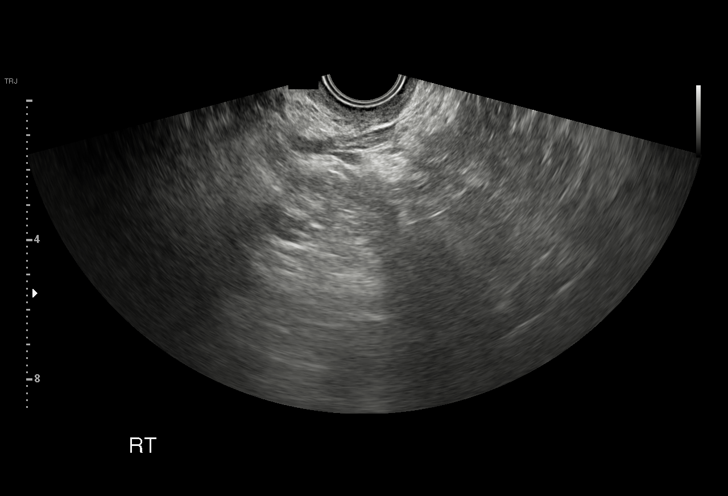
[im 29/37]
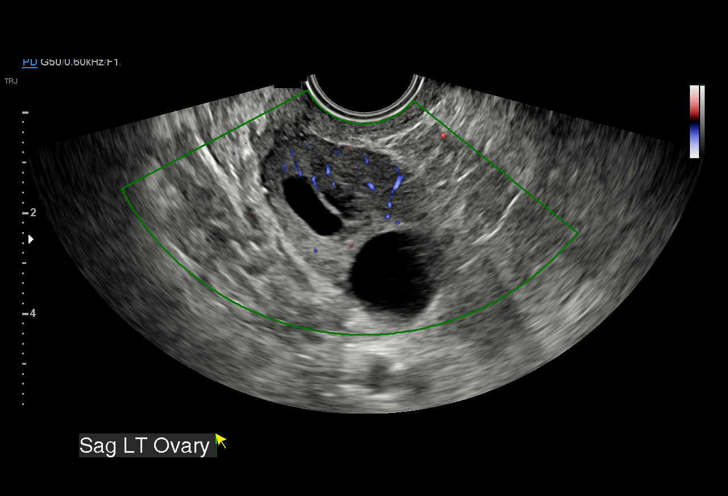
[im 31/37]
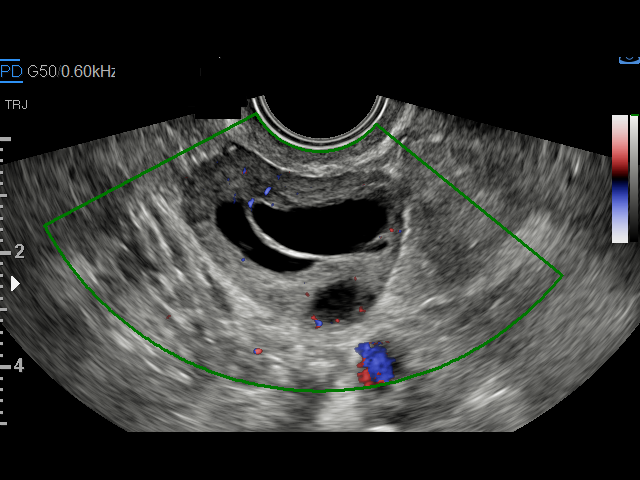
[im 34/37]
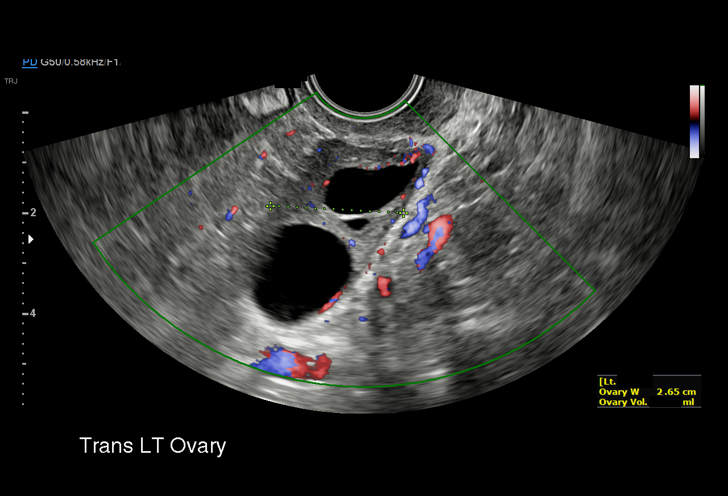
[im 37/37]
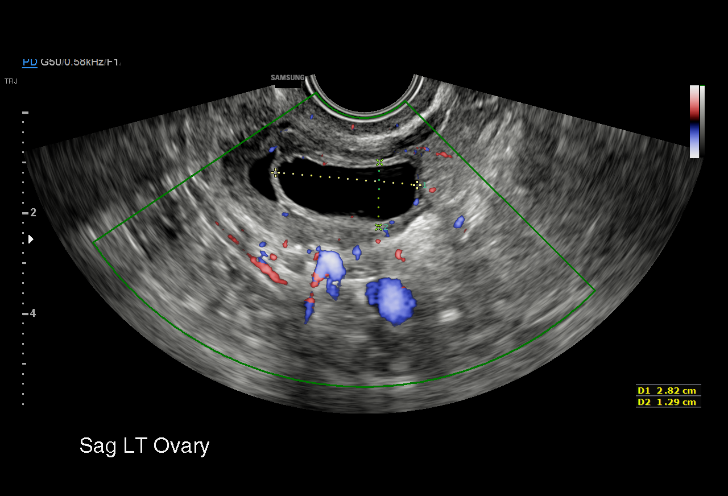

[15 of 25 positions shown; findings below may reference images not displayed]

FINDINGS: Uterus

Surgically absent

Endometrium

N/A

Right ovary

Surgically absent

Left ovary

Measurements: 4.6 x 2.8 x 2.7 cm = volume: 17.8 mL. Two dominant
follicles, 2.8 x 1.3 x 2.1 cm and 1.9 x 1.9 x 1.8 cm. No additional
masses. Large cyst complicated by a septation on the previous exam
is no longer identified.

Other findings:  No free pelvic fluid.  No adnexal masses.
IMPRESSION: 2 dominant follicles within the LEFT ovary measuring 2.8 cm and
cm in greatest sizes.

Post hysterectomy and RIGHT salpingo oophorectomy.

Resolution of previously identified large complicated cyst of the
LEFT ovary.

No new pelvic sonographic abnormalities.

## 2021-07-16 ENCOUNTER — Inpatient Hospital Stay: Admission: RE | Admit: 2021-07-16 | Payer: No Typology Code available for payment source | Source: Ambulatory Visit

## 2021-08-28 IMAGING — CR DG CHEST 2V
2 series · 2 of 2 positions shown · non-contrast
Comparison: 04/06/2018

CLINICAL DATA: Nausea and cough

EXAM:
CHEST - 2 VIEW

[chest pa]
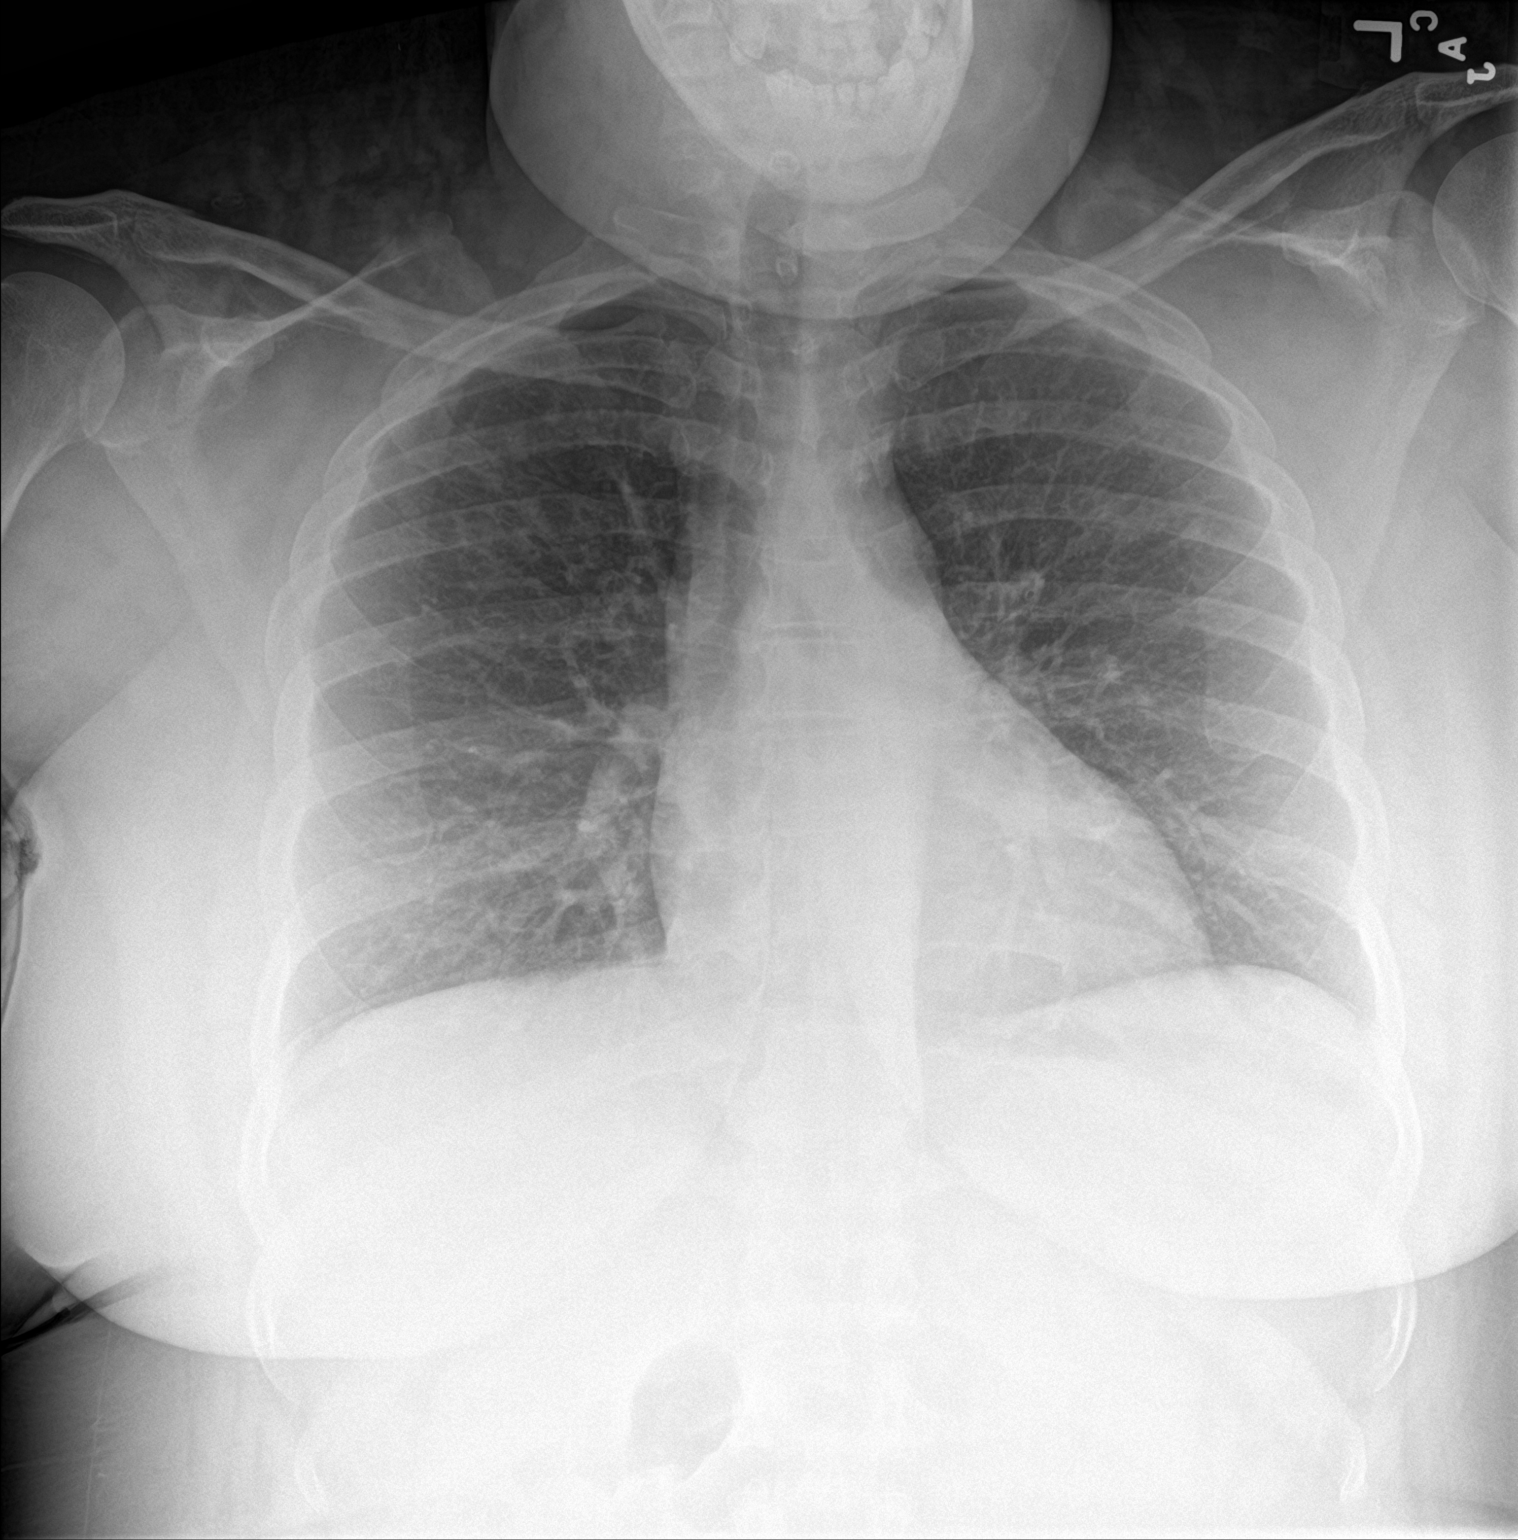

[chest lat]
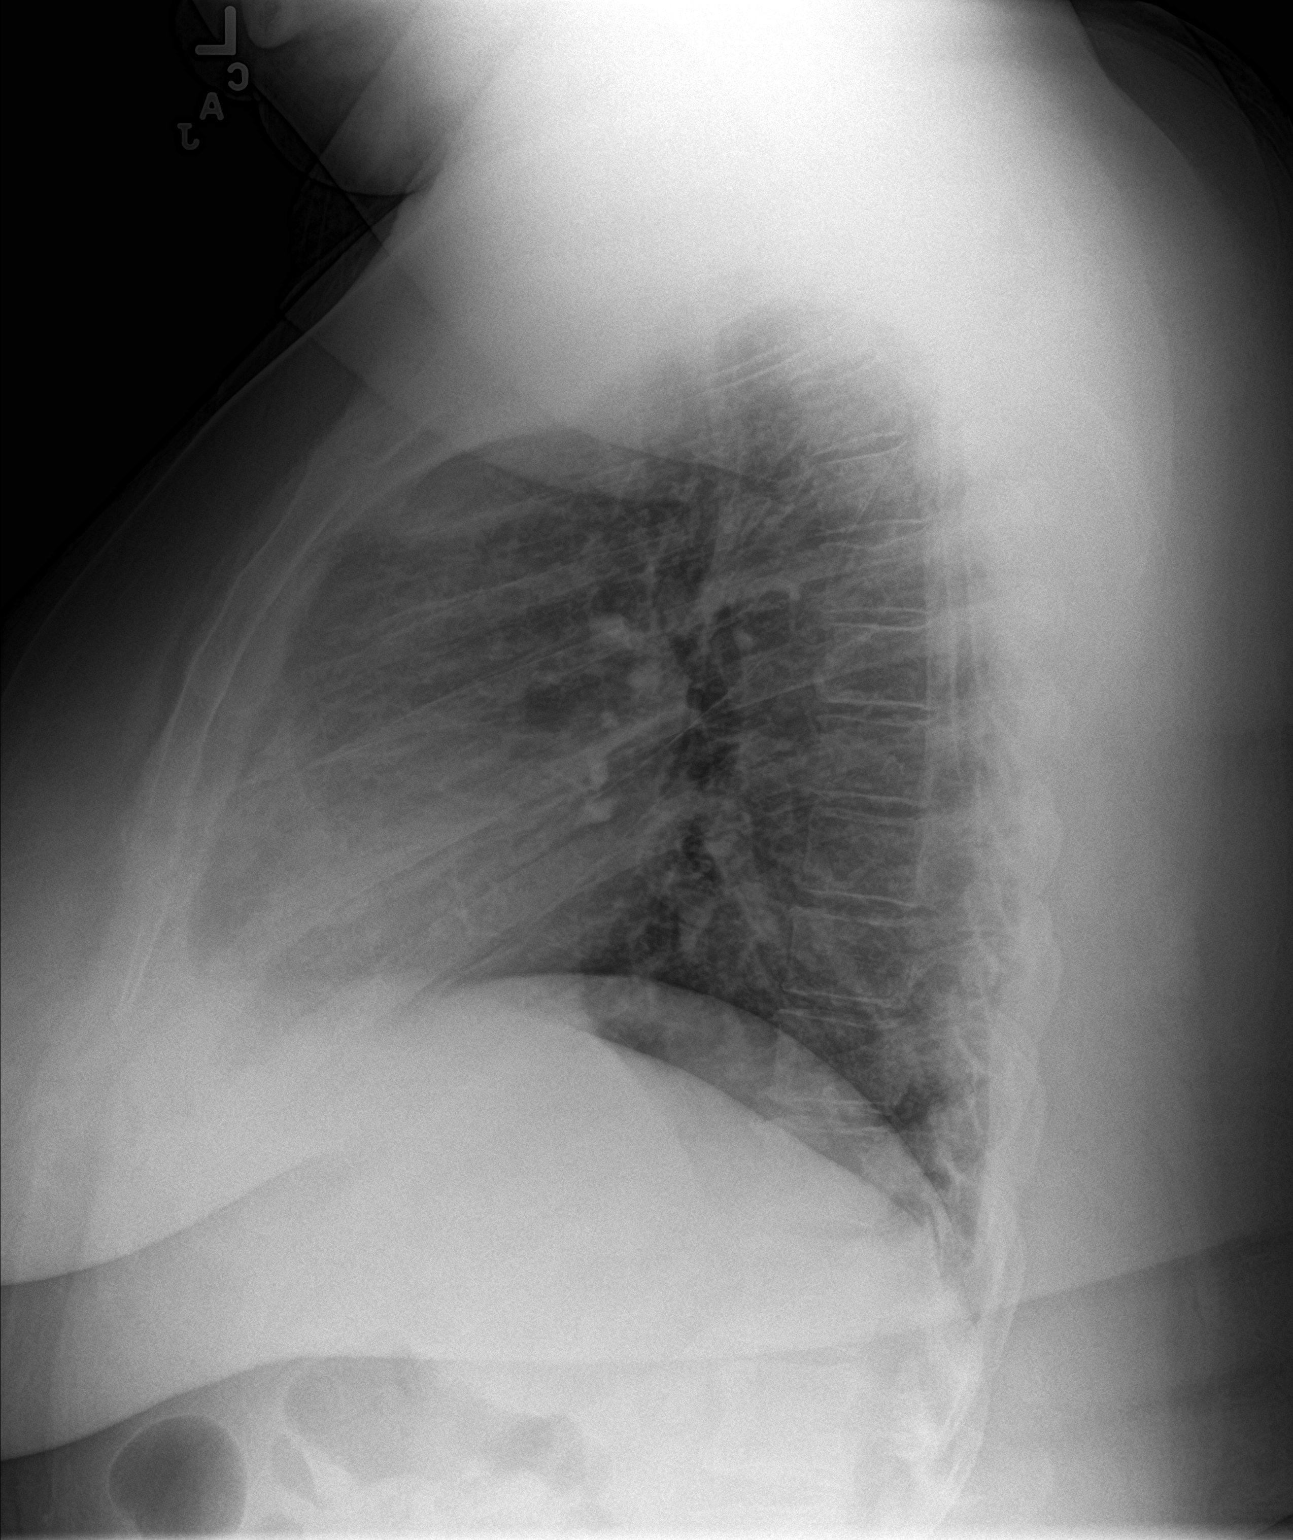

[2 of 2 positions shown; findings below may reference images not displayed]

FINDINGS: The heart size and mediastinal contours are within normal limits.
Both lungs are clear. The visualized skeletal structures are
unremarkable.
IMPRESSION: No active cardiopulmonary disease.

## 2021-10-04 IMAGING — US US THYROID
1 series · 13 of 25 positions shown · non-contrast
Comparison: None.

CLINICAL DATA: Anterior neck mass with concern for a dominant
thyroid nodule.

EXAM:
THYROID ULTRASOUND
TECHNIQUE: Ultrasound examination of the thyroid gland and adjacent soft
tissues was performed.

[Series 1: us thyroid · 0.07mm/px · 13 of 42 slices shown]
[im 1/42]
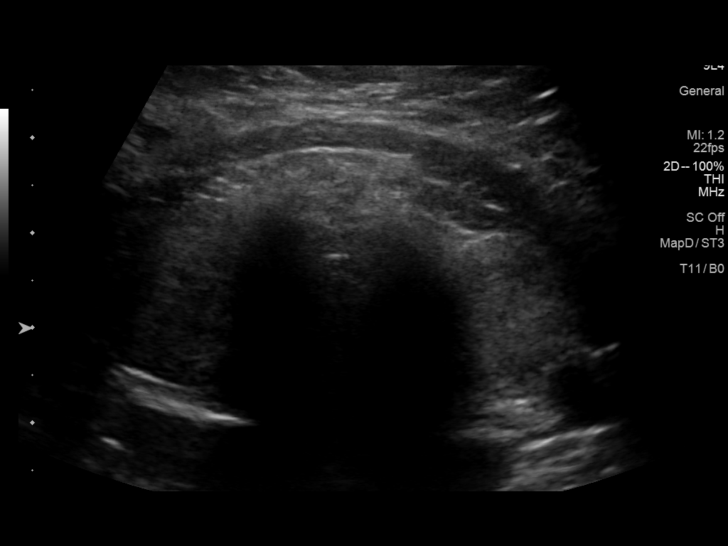
[im 4/42]
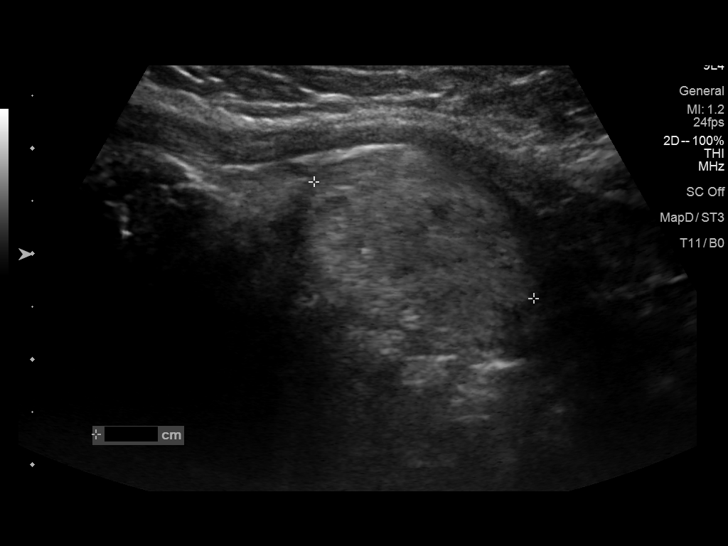
[im 7/42]
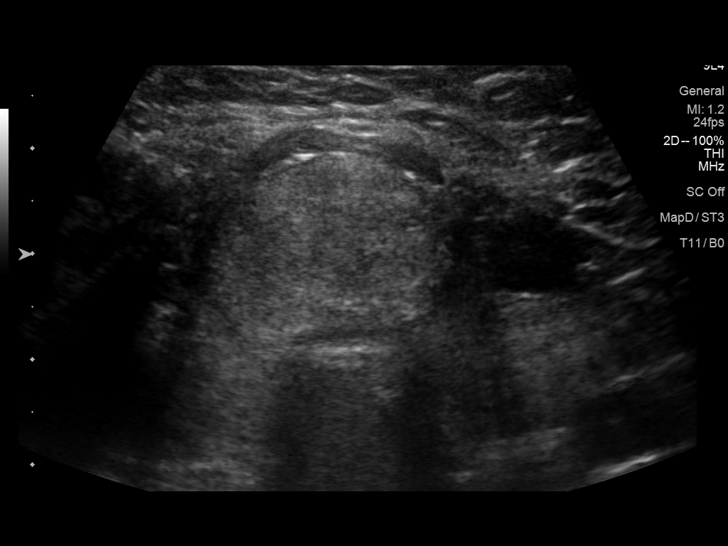
[im 11/42]
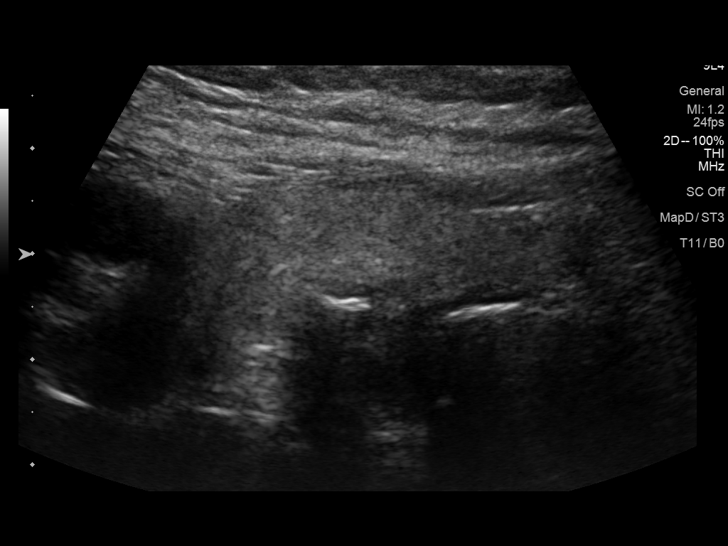
[im 14/42]
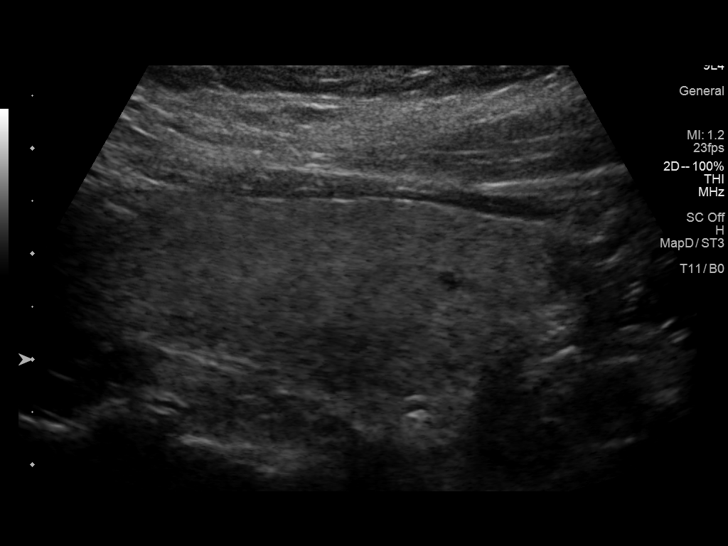
[im 18/42]
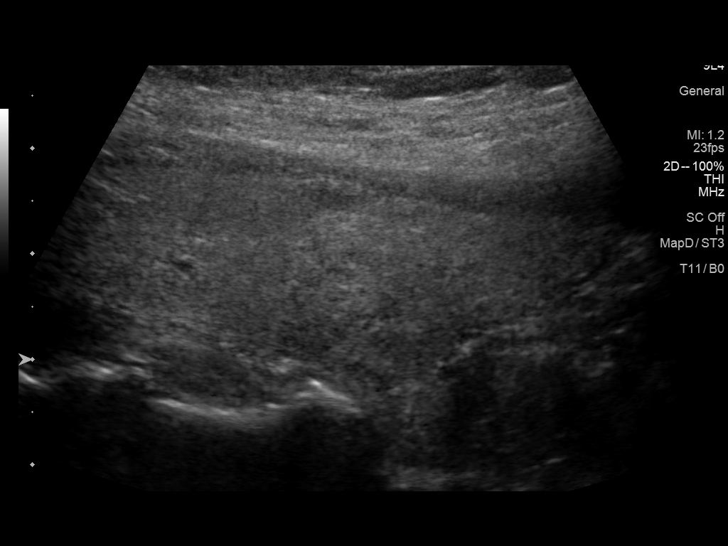
[im 21/42]
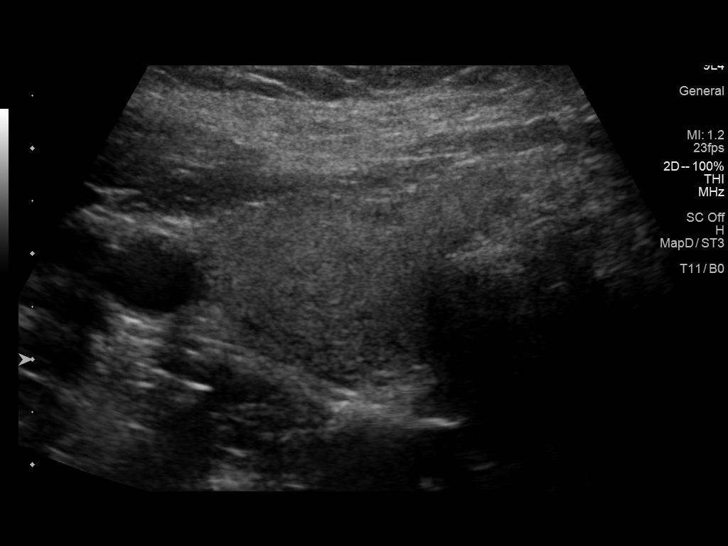
[im 24/42]
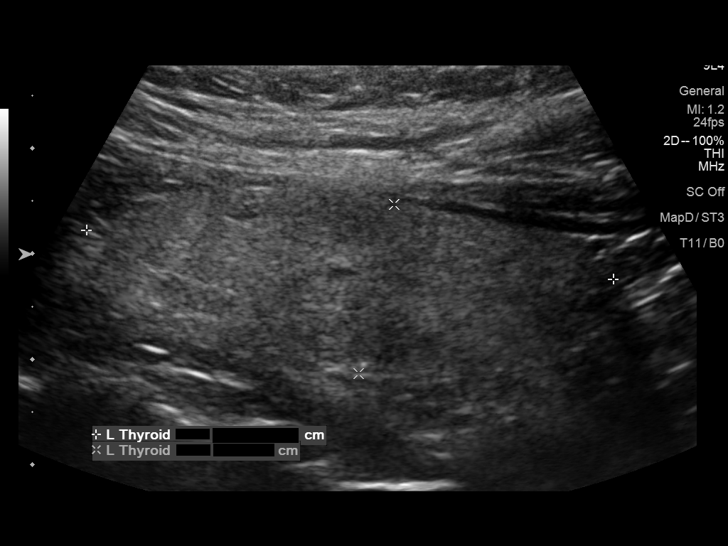
[im 28/42]
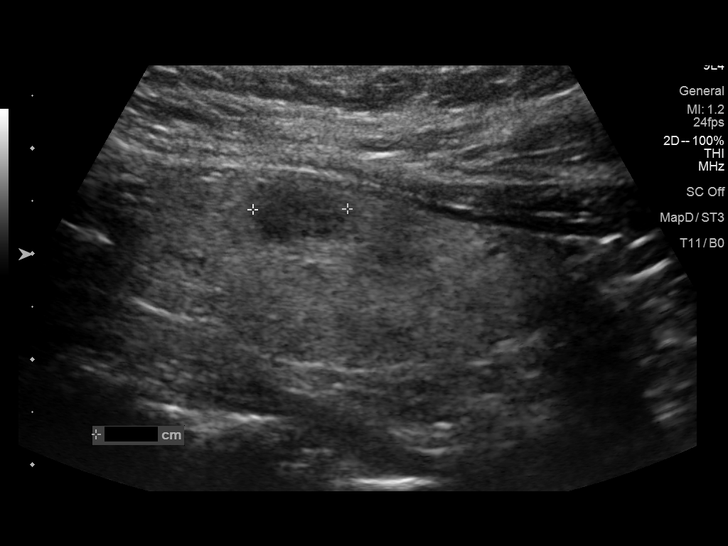
[im 31/42]
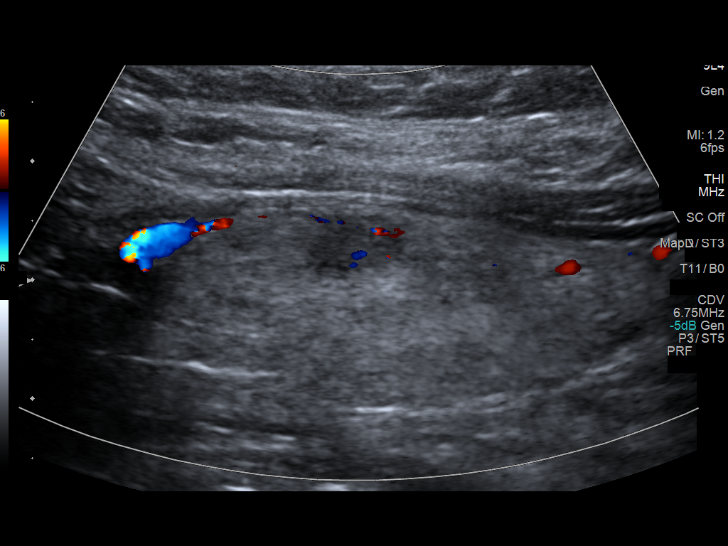
[im 35/42]
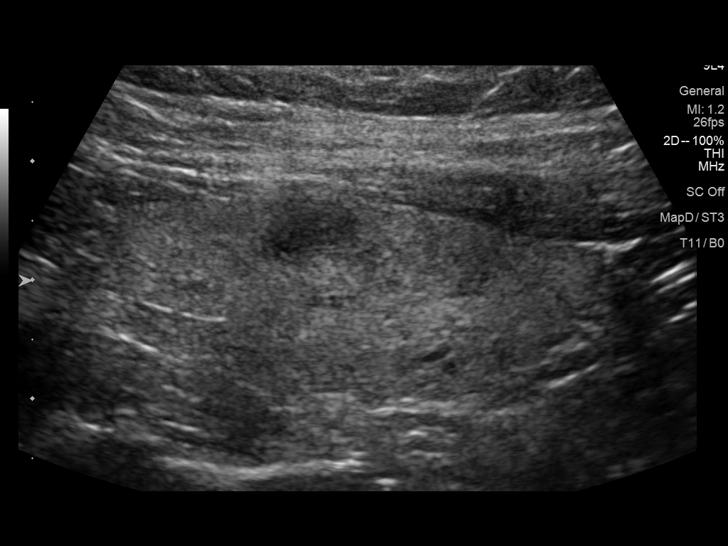
[im 38/42]
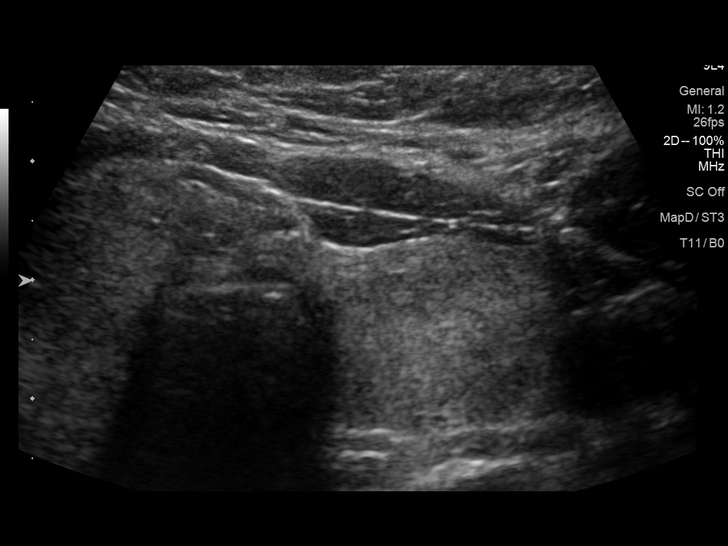
[im 42/42]
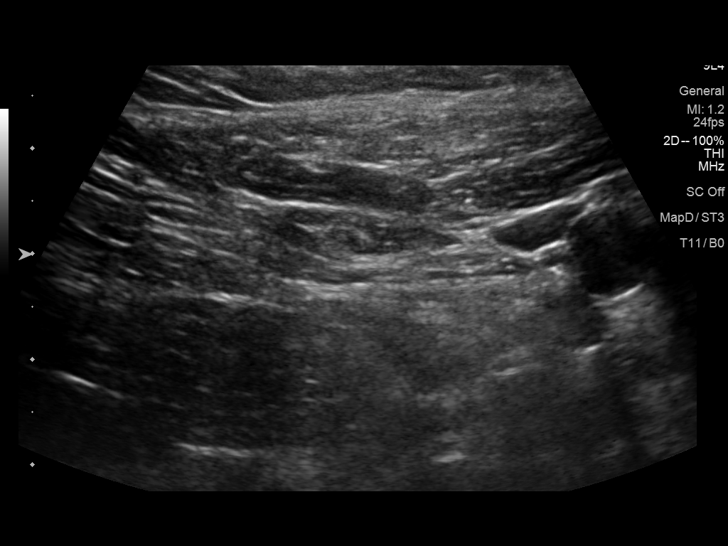

[13 of 25 positions shown; findings below may reference images not displayed]

FINDINGS: Parenchymal Echotexture: Moderately heterogenous

Isthmus: 0.7 cm

Right lobe: 4.3 x 2.2 x 2.3 cm

Left lobe: 5 x 1.8 x 2 cm

_________________________________________________________

Estimated total number of nodules >/= 1 cm: 1

Number of spongiform nodules >/=  2 cm not described below (TR1): 0

Number of mixed cystic and solid nodules >/= 1.5 cm not described
below (TR2): 0

_________________________________________________________

Nodule # 1:

Location: Isthmus; Inferior

Maximum size: 2.4 cm; Other 2 dimensions: 2.2 x 1.8 cm

Composition: solid/almost completely solid (2)

Echogenicity: isoechoic (1)

Shape: not taller-than-wide (0)

Margins: smooth (0)

Echogenic foci: none (0)

ACR TI-RADS total points: 3.

ACR TI-RADS risk category: TR3 (3 points).

ACR TI-RADS recommendations:

*Given size (>/= 1.5 - 2.4 cm) and appearance, a follow-up
ultrasound in 1 year should be considered based on TI-RADS criteria.

_________________________________________________________

Nodule # 2:

Location: Left; Mid

Maximum size: 0.9 cm; Other 2 dimensions: 0.7 x 0.8 cm

Composition: solid/almost completely solid (2)

Echogenicity: hypoechoic (2)

Shape: not taller-than-wide (0)

Margins: smooth (0)

Echogenic foci: none (0)

ACR TI-RADS total points: 4.

ACR TI-RADS risk category: TR4 (4-6 points).

ACR TI-RADS recommendations:

Given size (<0.9 cm) and appearance, this nodule does NOT meet
TI-RADS criteria for biopsy or dedicated follow-up.

_________________________________________________________

There are no worrisome cervical lymph nodes identified on this exam.
IMPRESSION: 1. Moderately heterogeneous mildly enlarged thyroid gland as
detailed above.
2. There is a 2.4 cm TR 3 thyroid nodule in the isthmus. A 1 year
follow-up ultrasound is recommended for this thyroid nodule.

The above is in keeping with the ACR TI-RADS recommendations - [HOSPITAL] 5720;[DATE].

## 2022-01-12 ENCOUNTER — Encounter (HOSPITAL_COMMUNITY): Payer: Self-pay | Admitting: Emergency Medicine

## 2022-01-12 ENCOUNTER — Emergency Department (HOSPITAL_COMMUNITY): Payer: Commercial Managed Care - HMO

## 2022-01-12 ENCOUNTER — Emergency Department (HOSPITAL_COMMUNITY)
Admission: EM | Admit: 2022-01-12 | Discharge: 2022-01-12 | Disposition: A | Discharge status: Home / Self Care | Payer: Commercial Managed Care - HMO | Attending: Emergency Medicine | Admitting: Emergency Medicine

## 2022-01-12 ENCOUNTER — Other Ambulatory Visit: Payer: Self-pay

## 2022-01-12 DIAGNOSIS — R519 Headache, unspecified: Secondary | ICD-10-CM | POA: Insufficient documentation

## 2022-01-12 DIAGNOSIS — M79602 Pain in left arm: Secondary | ICD-10-CM | POA: Diagnosis present

## 2022-01-12 DIAGNOSIS — M542 Cervicalgia: Secondary | ICD-10-CM | POA: Diagnosis not present

## 2022-01-12 DIAGNOSIS — Y92410 Unspecified street and highway as the place of occurrence of the external cause: Secondary | ICD-10-CM | POA: Insufficient documentation

## 2022-01-12 DIAGNOSIS — M25512 Pain in left shoulder: Secondary | ICD-10-CM | POA: Insufficient documentation

## 2022-01-12 DIAGNOSIS — R0789 Other chest pain: Secondary | ICD-10-CM | POA: Diagnosis not present

## 2022-01-12 DIAGNOSIS — M79642 Pain in left hand: Secondary | ICD-10-CM | POA: Insufficient documentation

## 2022-01-12 MED ORDER — FENTANYL CITRATE PF 50 MCG/ML IJ SOSY
50.0000 ug | PREFILLED_SYRINGE | Freq: Once | INTRAMUSCULAR | Status: AC
  Administered 2022-01-12: 50 ug via INTRAMUSCULAR
  Filled 2022-01-12: qty 1
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
## 2022-01-12 NOTE — Progress Notes (Signed)
Orthopedic Tech Progress Note Patient Details:  [name] 1971/02/18 [id number]  Ortho Devices Type of Ortho Device: Sling immobilizer Ortho Device/Splint Location: LUE Ortho Device/Splint Interventions: Ordered, Application, Adjustment   Post Interventions Patient Tolerated: Well Instructions Provided: [facility] 01/12/2022, 1:02 PM
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
## 2022-01-19 IMAGING — CR DG KNEE COMPLETE 4+V*L*
4 series · 4 of 4 positions shown · non-contrast
Comparison: None.

CLINICAL DATA: Pain

EXAM:
LEFT KNEE - COMPLETE 4+ VIEW

[knee ap]
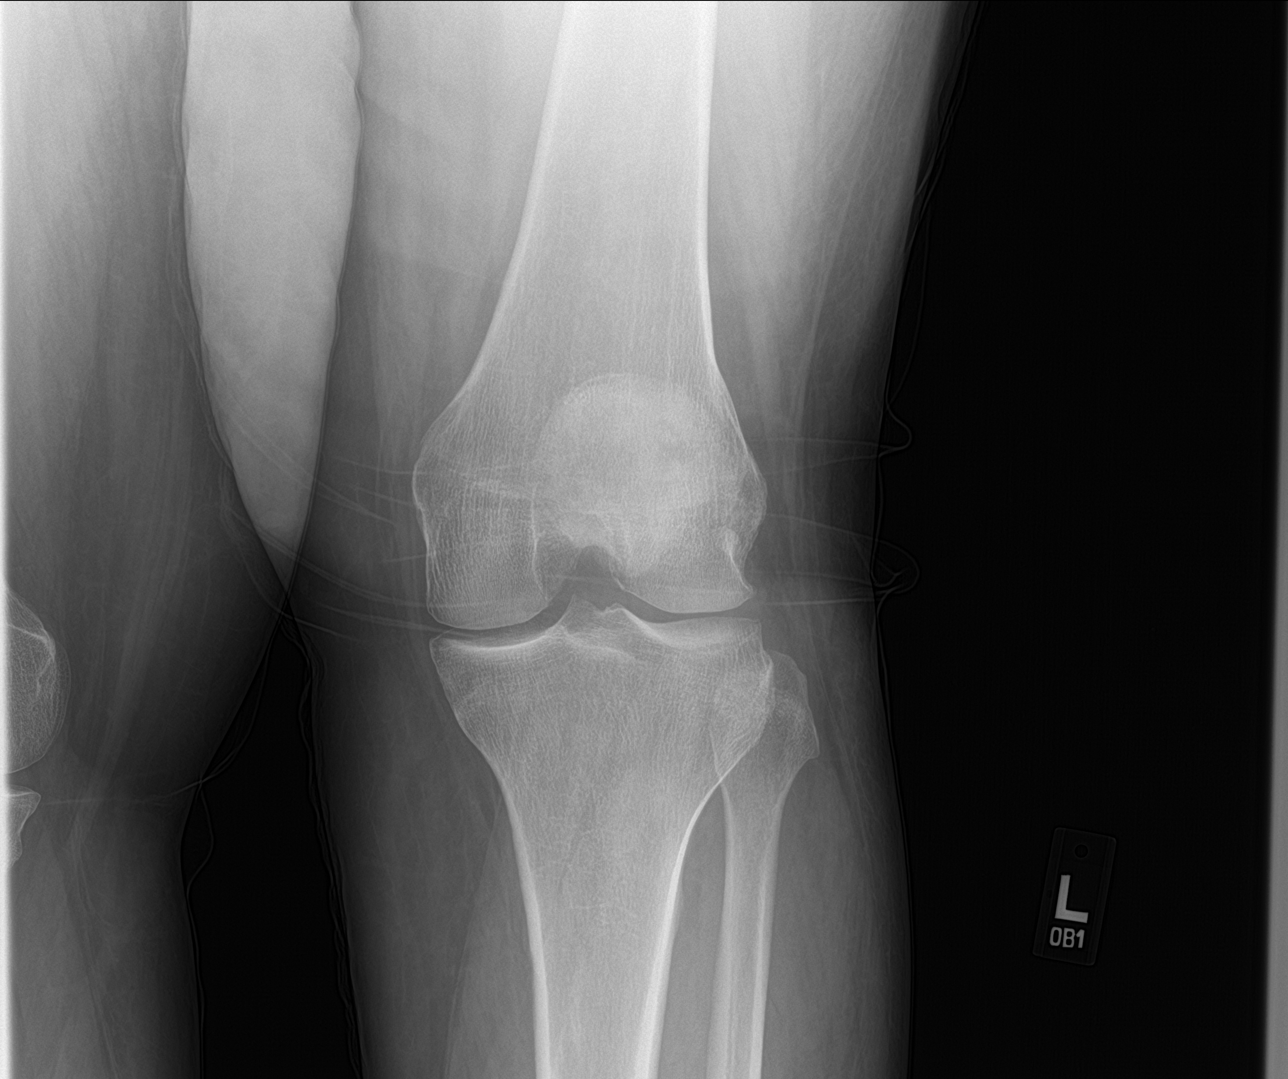

[knee lat]
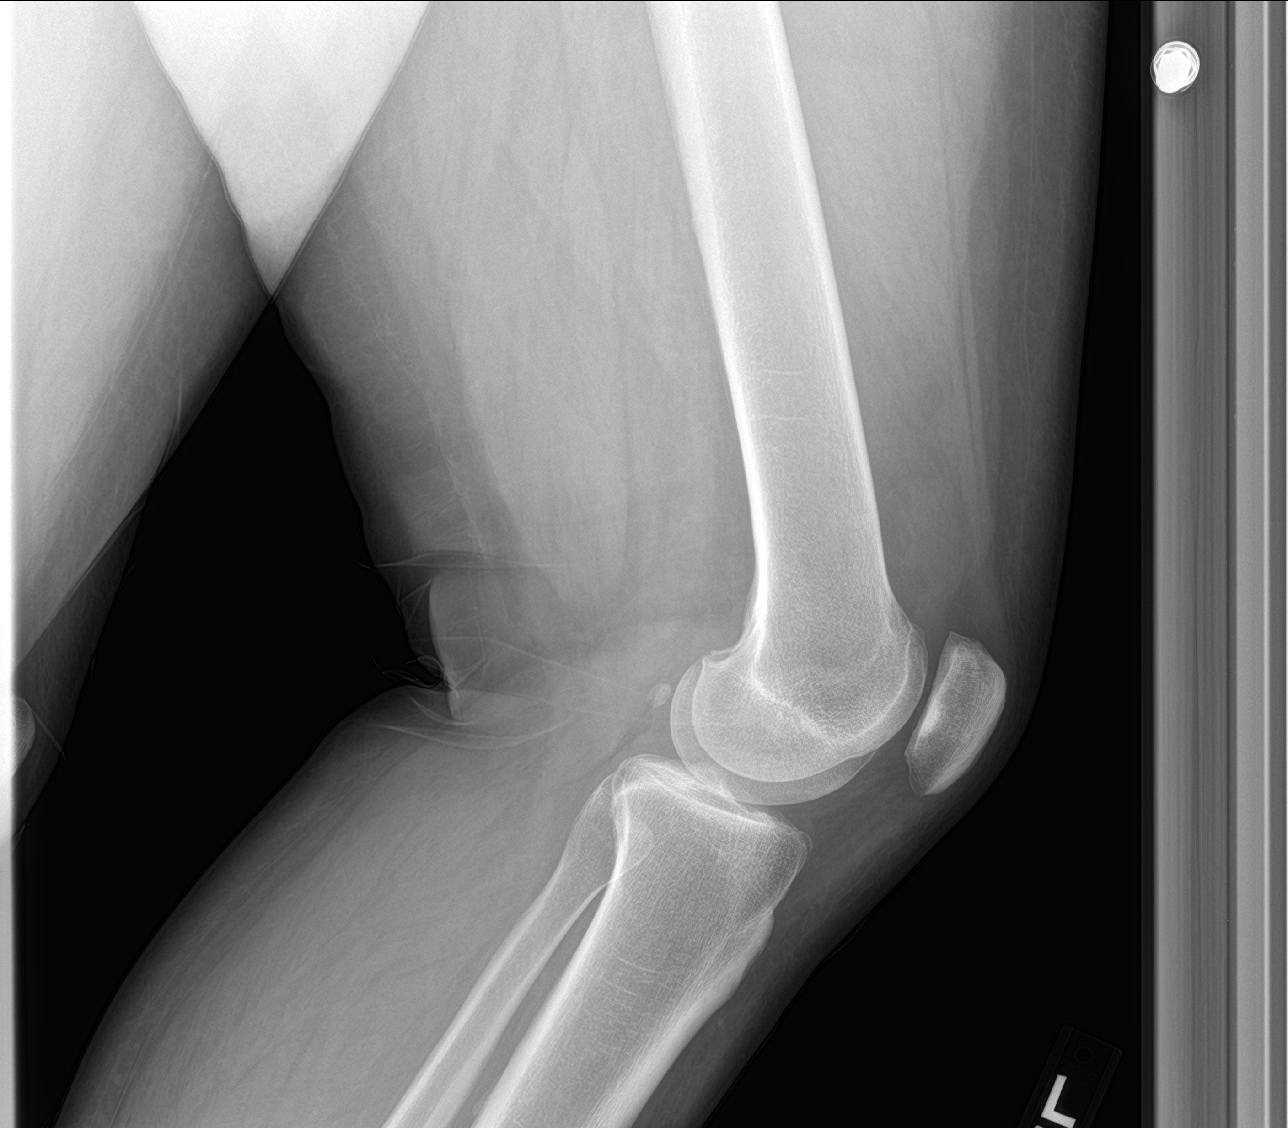

[knee obl (1 of 2)]
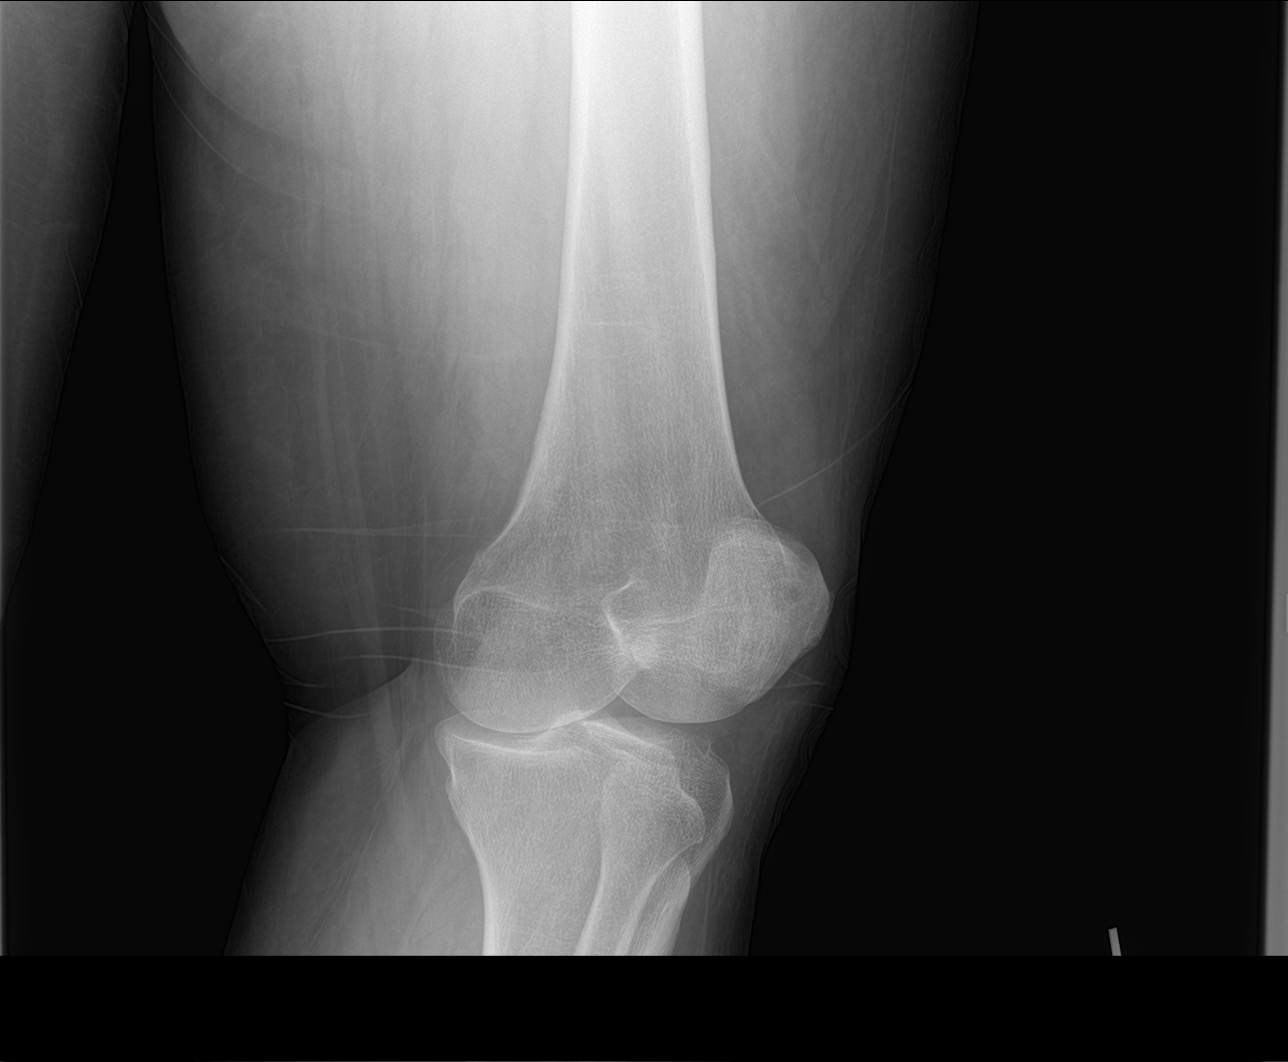

[knee obl (2 of 2)]
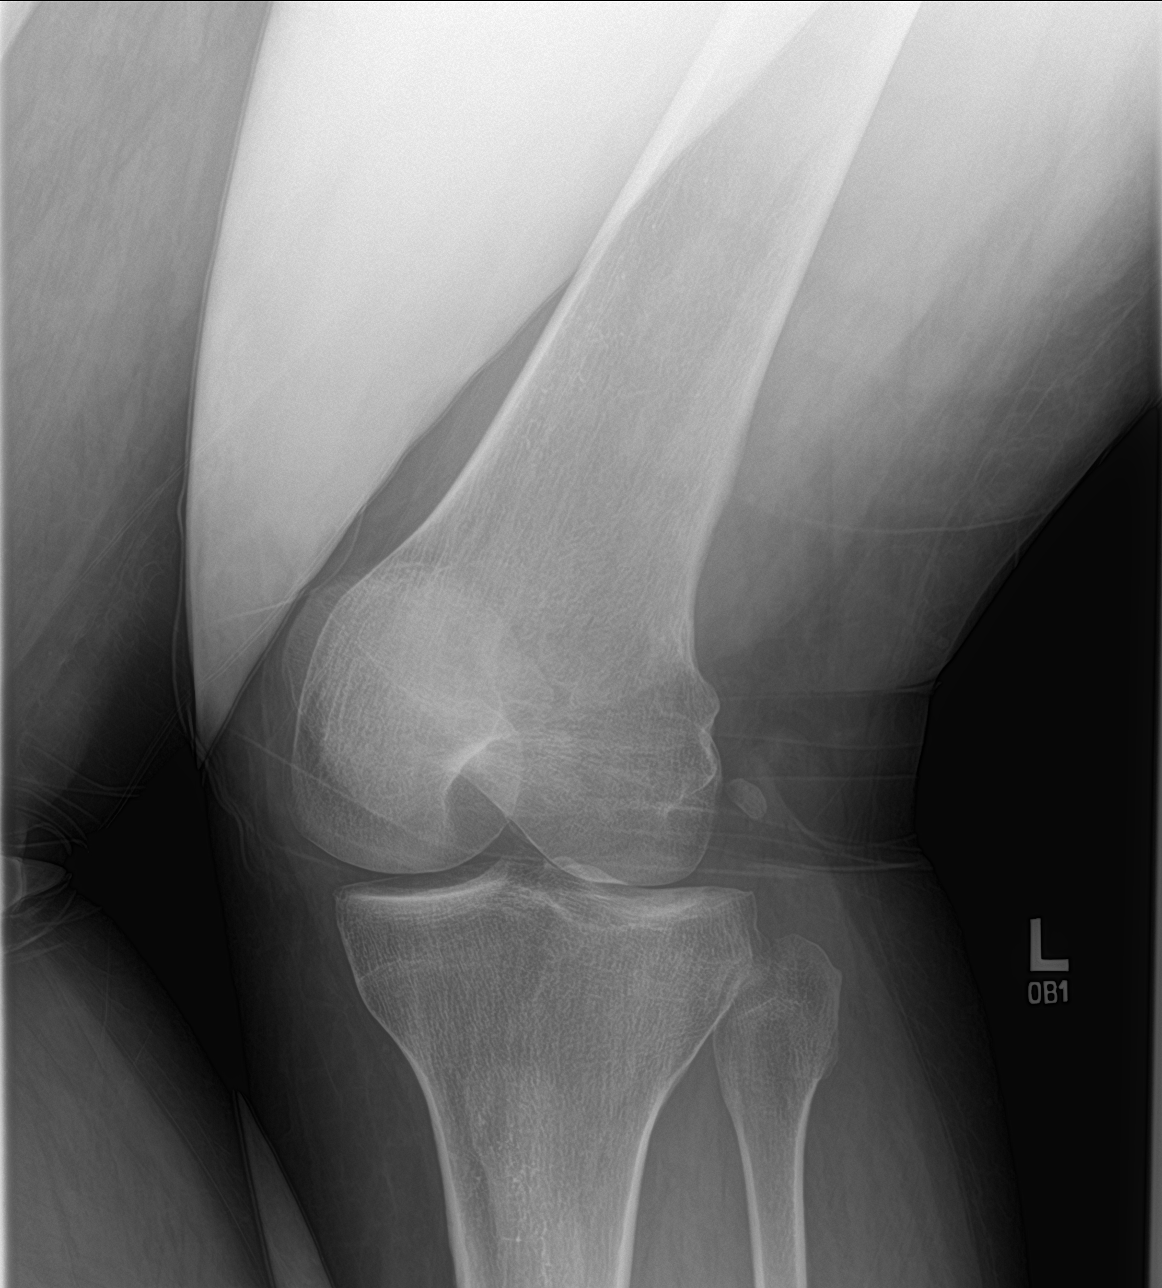

[4 of 4 positions shown; findings below may reference images not displayed]

FINDINGS: No evidence of fracture, dislocation, or joint effusion. No evidence
of arthropathy or other focal bone abnormality. Soft tissues are
unremarkable.
IMPRESSION: Negative.

## 2022-02-10 IMAGING — MR MR KNEE*L* W/O CM
4 of 6 series · 11 of 40 positions shown · non-contrast
Comparison: Radiographs 08/18/2019

CLINICAL DATA: Left knee pain for 2 months. Worsening pain with
walking.

EXAM:
MRI OF THE LEFT KNEE WITHOUT CONTRAST
TECHNIQUE: Multiplanar, multisequence MR imaging of the knee was performed. No
intravenous contrast was administered.

[Series 3: T2 fat-sat · axial · 4.0mm · 0.35mm/px · z∈[-95,+30]mm · 3 of 35 slices shown (1 of 2)]
[im 5/35]
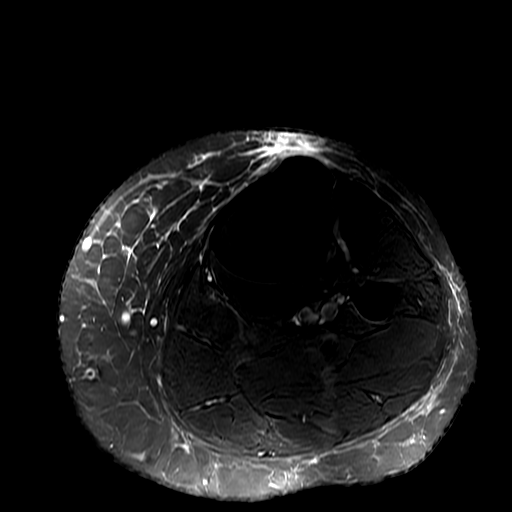
[im 20/35]
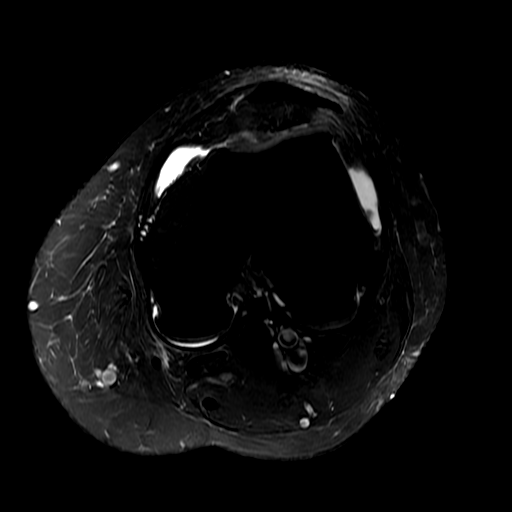
[im 30/35]
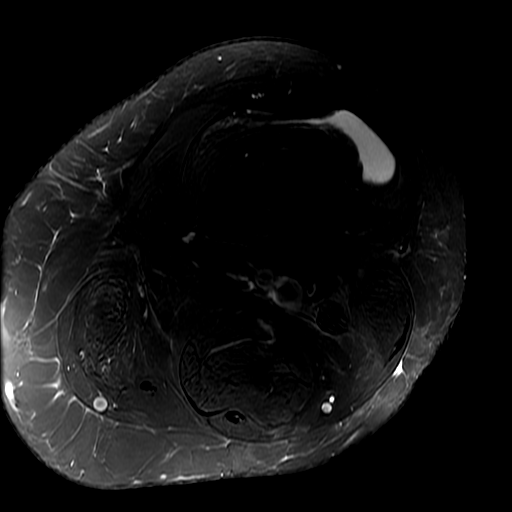

[Series 5: PD fat-sat · coronal · 4.0mm · 0.18mm/px · 3 of 32 slices shown (1 of 2)]
[im 7/32]
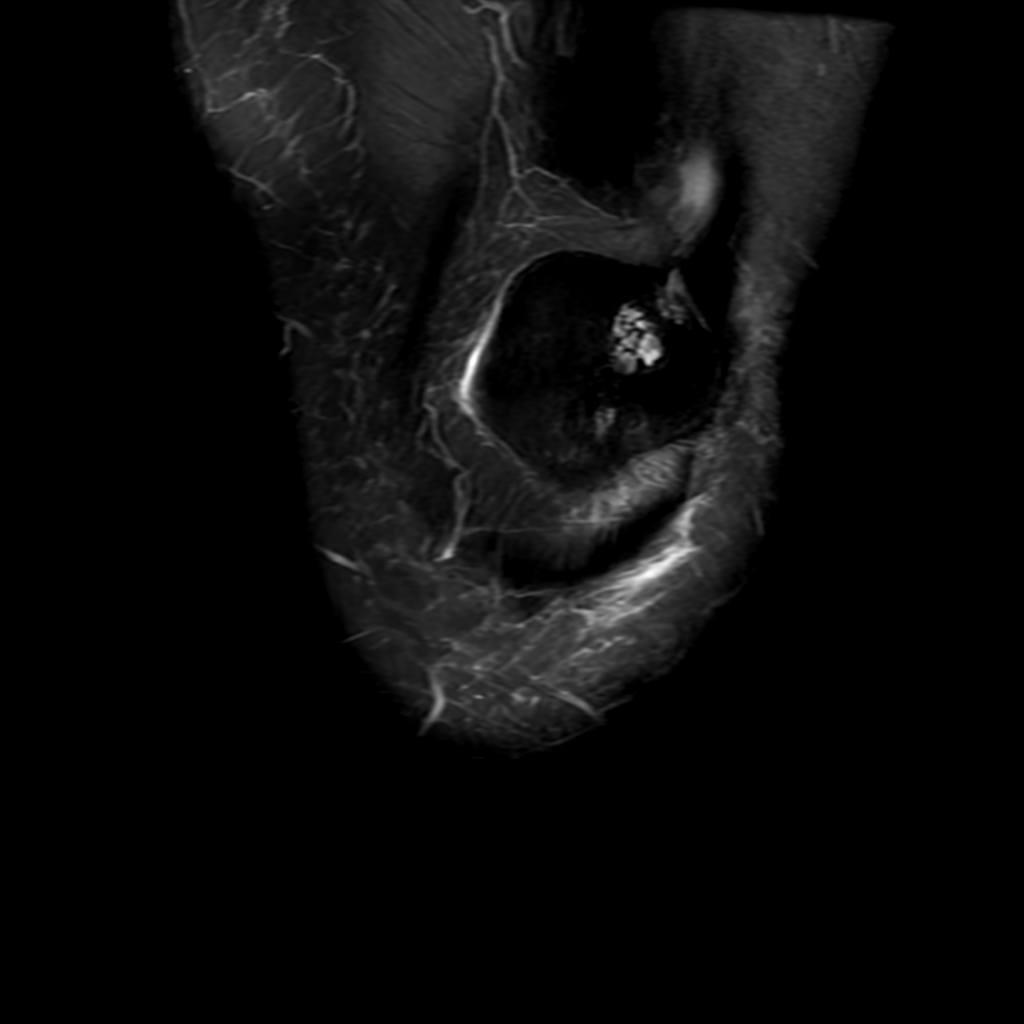
[im 19/32]
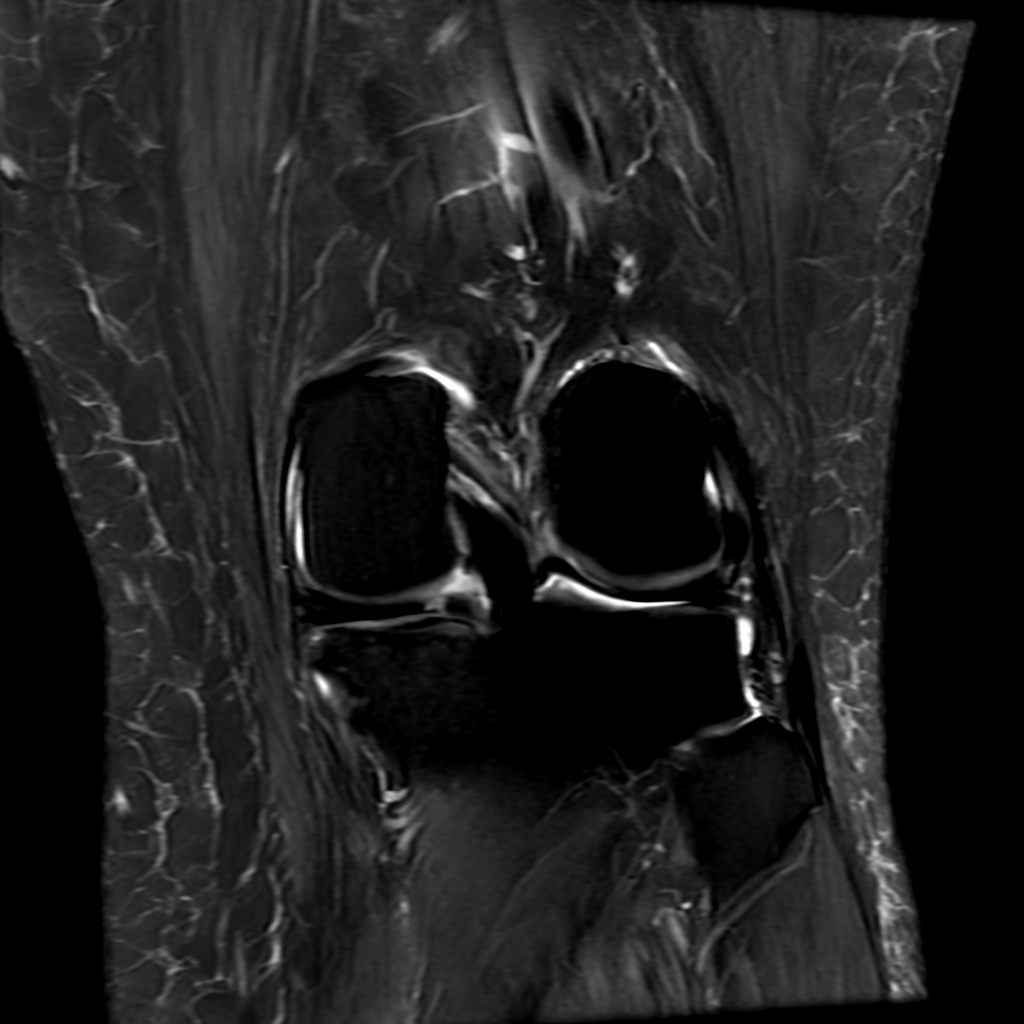
[im 32/32]
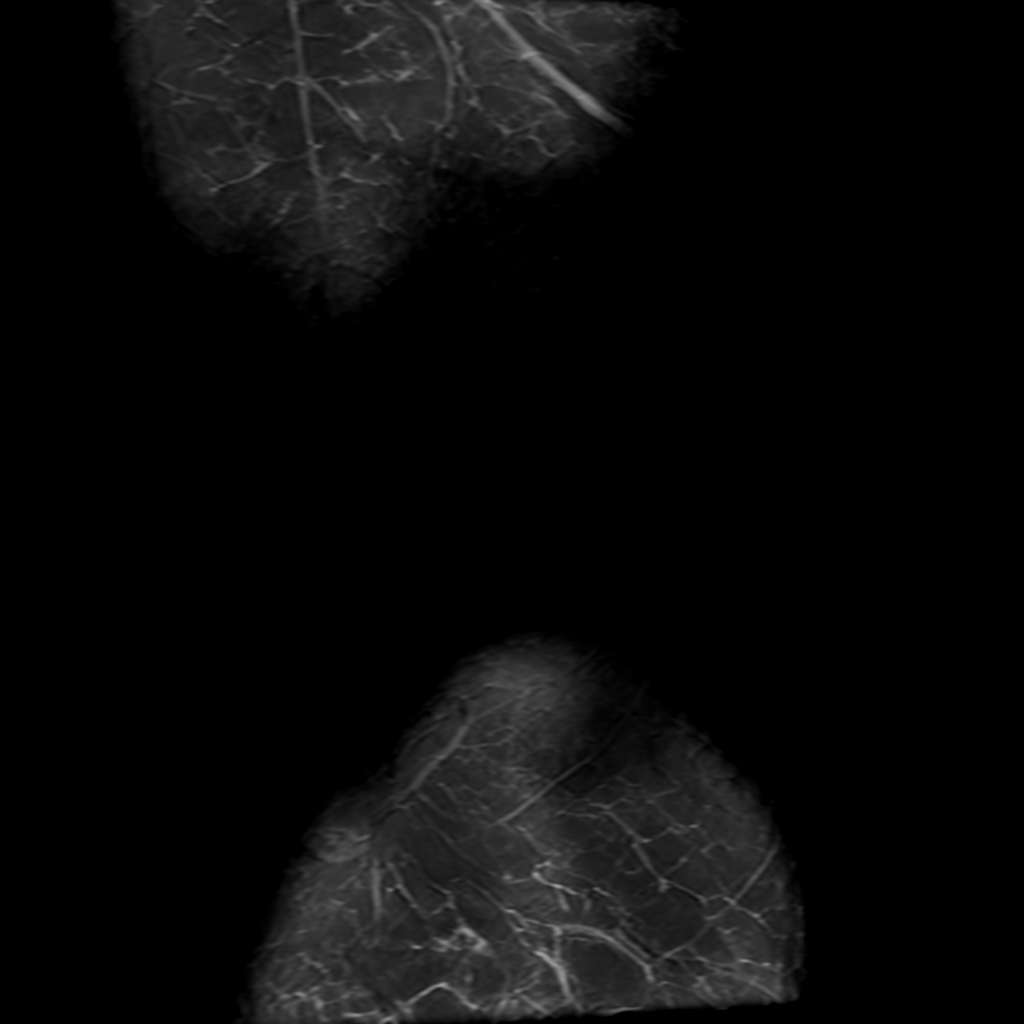

[Series 6: PD fat-sat · sagittal · 3.0mm · 0.31mm/px · 3 of 38 slices shown (2 of 2)]
[im 7/38]
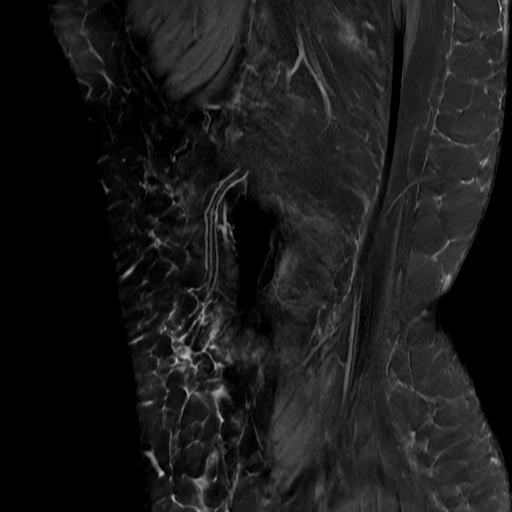
[im 19/38]
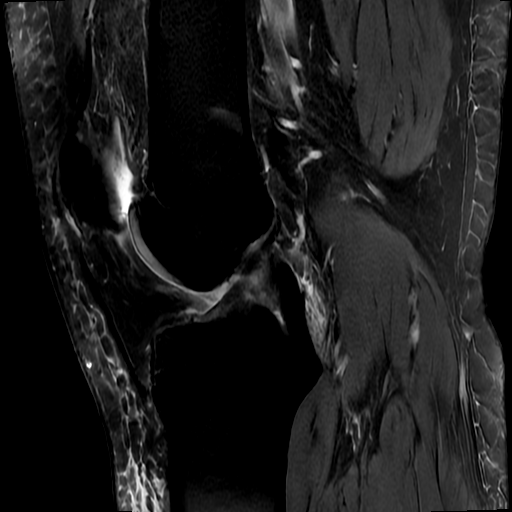
[im 31/38]
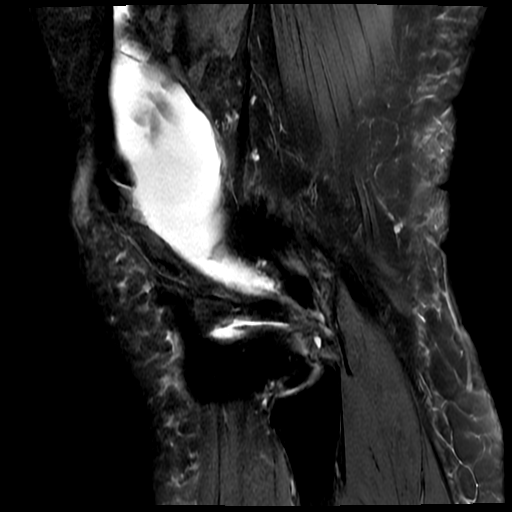

[Series 7: T2 fat-sat · sagittal · 3.0mm · 0.31mm/px · 2 of 38 slices shown (2 of 2)]
[im 7/38]
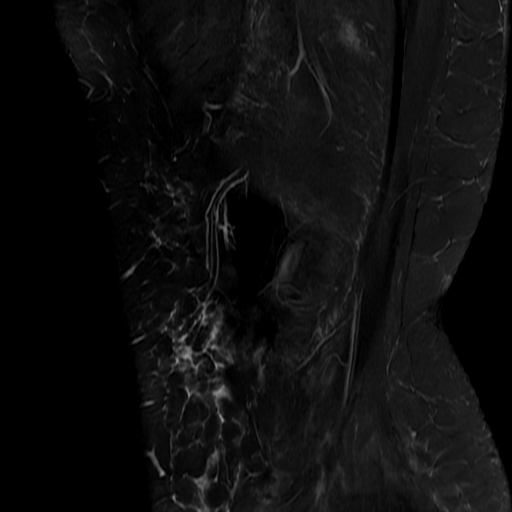
[im 19/38]
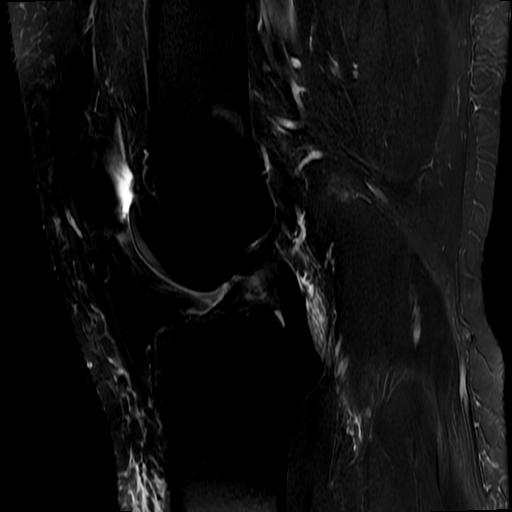

[11 of 40 positions shown; findings below may reference images not displayed]

FINDINGS: MENISCI

Medial meniscus: There is a small radial tear of the posterior horn
near the meniscal root, best seen on coronal image [DATE]. The
meniscal root is intact. No displaced meniscal fragment.

Lateral meniscus:  Intact with normal morphology.

LIGAMENTS

Cruciates:  Intact.

Collaterals:  Intact.

CARTILAGE

Patellofemoral: Moderate patellofemoral degenerative changes with
diffuse chondral thinning and subchondral cyst formation in the
lateral patellar facet. Mild trochlear chondral thinning without
subchondral cyst formation.

Medial:  Minimal chondral thinning without focal defect.

Lateral:  Preserved.

MISCELLANEOUS

Joint:  Small joint effusion without evidence of loose body.

Popliteal Fossa:  Unremarkable. No significant Baker's cyst.

Extensor Mechanism:  Intact.

Bones:  No acute or significant extra-articular osseous findings.

Other: Mild prepatellar subcutaneous edema.
IMPRESSION: 1. Small radial tear of the posterior horn of the medial meniscus
near the meniscal root. No displaced meniscal fragment.
2. The lateral meniscus, cruciate and collateral ligaments are
intact.
3. Moderate patellofemoral degenerative changes. Minimal medial
compartment degenerative changes. No acute osseous findings.
4. Small joint effusion.

## 2022-07-18 ENCOUNTER — Emergency Department (HOSPITAL_COMMUNITY): Payer: Medicaid Other

## 2022-07-18 ENCOUNTER — Emergency Department (HOSPITAL_COMMUNITY)
Admission: EM | Admit: 2022-07-18 | Discharge: 2022-07-18 | Disposition: A | Discharge status: Home / Self Care | Payer: Medicaid Other | Attending: Emergency Medicine | Admitting: Emergency Medicine

## 2022-07-18 ENCOUNTER — Other Ambulatory Visit: Payer: Self-pay

## 2022-07-18 DIAGNOSIS — M542 Cervicalgia: Secondary | ICD-10-CM | POA: Diagnosis not present

## 2022-07-18 DIAGNOSIS — M62830 Muscle spasm of back: Secondary | ICD-10-CM | POA: Insufficient documentation

## 2022-07-18 DIAGNOSIS — M546 Pain in thoracic spine: Secondary | ICD-10-CM | POA: Diagnosis present

## 2022-07-18 DIAGNOSIS — R519 Headache, unspecified: Secondary | ICD-10-CM | POA: Diagnosis not present

## 2022-07-18 DIAGNOSIS — X500XXA Overexertion from strenuous movement or load, initial encounter: Secondary | ICD-10-CM | POA: Insufficient documentation

## 2022-07-18 DIAGNOSIS — M62838 Other muscle spasm: Secondary | ICD-10-CM

## 2022-07-18 LAB — CBC WITH DIFFERENTIAL/PLATELET
Abs Immature Granulocytes: 0.01 10*3/uL (ref 0.00–0.07)
Basophils Absolute: 0.1 10*3/uL (ref 0.0–0.1)
Basophils Relative: 1 %
Eosinophils Absolute: 0.2 10*3/uL (ref 0.0–0.5)
Eosinophils Relative: 4 %
HCT: 34.7 % — ABNORMAL LOW (ref 36.0–46.0)
Hemoglobin: 11 g/dL — ABNORMAL LOW (ref 12.0–15.0)
Immature Granulocytes: 0 %
Lymphocytes Relative: 45 %
Lymphs Abs: 2.8 10*3/uL (ref 0.7–4.0)
MCH: 28.2 pg (ref 26.0–34.0)
MCHC: 31.7 g/dL (ref 30.0–36.0)
MCV: 89 fL (ref 80.0–100.0)
Monocytes Absolute: 0.5 10*3/uL (ref 0.1–1.0)
Monocytes Relative: 9 %
Neutro Abs: 2.6 10*3/uL (ref 1.7–7.7)
Neutrophils Relative %: 41 %
Platelets: 264 10*3/uL (ref 150–400)
RBC: 3.9 MIL/uL (ref 3.87–5.11)
RDW: 13.1 % (ref 11.5–15.5)
WBC: 6.2 10*3/uL (ref 4.0–10.5)
nRBC: 0 % (ref 0.0–0.2)

## 2022-07-18 LAB — BASIC METABOLIC PANEL
Anion gap: 10 (ref 5–15)
BUN: 11 mg/dL (ref 6–20)
CO2: 26 mmol/L (ref 22–32)
Calcium: 9.2 mg/dL (ref 8.9–10.3)
Chloride: 104 mmol/L (ref 98–111)
Creatinine, Ser: 0.87 mg/dL (ref 0.44–1.00)
GFR, Estimated: >60 mL/min (ref >60)
Glucose, Bld: 76 mg/dL (ref 70–99)
Potassium: 3.8 mmol/L (ref 3.5–5.1)
Sodium: 140 mmol/L (ref 135–145)

## 2022-07-18 MED ORDER — LIDOCAINE 5 % EX PTCH
1.0000 | MEDICATED_PATCH | CUTANEOUS | Status: DC
  Administered 2022-07-18: 1 via TRANSDERMAL
  Filled 2022-07-18: qty 1

## 2022-07-18 MED ORDER — FENTANYL CITRATE PF 50 MCG/ML IJ SOSY
100.0000 ug | PREFILLED_SYRINGE | Freq: Once | INTRAMUSCULAR | Status: AC
  Administered 2022-07-18: 100 ug via INTRAVENOUS
  Filled 2022-07-18: qty 2

## 2022-07-18 MED ORDER — PROCHLORPERAZINE EDISYLATE 10 MG/2ML IJ SOLN
10.0000 mg | Freq: Once | INTRAMUSCULAR | Status: AC
  Administered 2022-07-18: 10 mg via INTRAVENOUS
  Filled 2022-07-18: qty 2

## 2022-07-18 MED ORDER — DIPHENHYDRAMINE HCL 50 MG/ML IJ SOLN
25.0000 mg | Freq: Once | INTRAMUSCULAR | Status: AC
  Administered 2022-07-18: 25 mg via INTRAVENOUS
  Filled 2022-07-18: qty 1

## 2022-07-18 MED ORDER — LIDOCAINE 5 % EX PTCH: 1.0000 | MEDICATED_PATCH | CUTANEOUS | 0 refills | Status: DC

## 2022-07-18 MED ORDER — NAPROXEN 500 MG PO TABS: 500.0000 mg | ORAL_TABLET | Freq: Two times a day (BID) | ORAL | 0 refills | Status: DC

## 2022-07-18 MED ORDER — METHOCARBAMOL 500 MG PO TABS: 500.0000 mg | ORAL_TABLET | Freq: Two times a day (BID) | ORAL | 0 refills | Status: DC

## 2022-07-18 MED ORDER — DIAZEPAM 5 MG PO TABS
5.0000 mg | ORAL_TABLET | Freq: Once | ORAL | Status: AC
  Administered 2022-07-18: 5 mg via ORAL
  Filled 2022-07-18: qty 1

## 2022-07-18 MED ORDER — IOHEXOL 350 MG/ML SOLN
75.0000 mL | Freq: Once | INTRAVENOUS | Status: AC | PRN
  Administered 2022-07-18: 75 mL via INTRAVENOUS

## 2022-07-18 NOTE — ED Triage Notes (Addendum)
Pt. Stated, I was lifting boxes at work and it was pretty heavy and I felt the pain in my whole back toward the left to middle, and then it went up into my head. This happened 2 days ago.

## 2022-07-18 NOTE — Discharge Instructions (Addendum)
The workup in the emergency room is reassuring.  We suspect that most likely her having significant muscle spasms affecting her neck and upper back.  We recommend that you perform consistent stretching exercises involving her neck and upper back muscles.  Warm compresses, adjunct medications that have been prescribed can also be helpful.  You might also benefit with deep tissue massage.  Please call your PCP immediately for a close follow-up.  If your symptoms continue despite the measures we discussed and you might need physical therapy.  If your symptoms worsen, return to the emergency room.

## 2022-07-18 NOTE — ED Provider Notes (Signed)
[facility] AT [facility] Provider Note   CSN: [id number] Arrival date & time: 07/18/22  1048     History  Chief Complaint  Patient presents with   Back Pain   Headache    [name] is a 52 y.o. female.  HPI    52 year old female comes in with chief complaint of neck pain, back pain and headache.  Patient has history of myocardial infarction.  Patient states that 2 days ago while at work, she was lifting a box above her head and she heard a sudden pop and started having pain over her upper part of the back.  She immediately dropped the box.  She had excruciating pain, 10 out of 10, but she continued to work and took some ibuprofen's.  Yesterday she continued to have severe pain but she again continued to work through it.  Today however, she decided to come to the emergency room as the pain is not really responding to the medicine.  She could not sleep well last night.  Her pain is primarily located over the upper part of her back and it radiates up to the neck and back of her head.  The pain is described as sharp and burning in nature.  She denies any associated one-sided numbness, weakness, slurred speech, vision change.  Review of system is negative for nausea or vomiting.  Patient however does indicate that lites and sound make her headaches worse.  She has history of migraines, but the current headaches are different than her migraines.  There is no family history of brain aneurysm.  Patient denies any tobacco use at the moment and denies any illicit drug use in the past.  Home Medications Prior to Admission medications   Medication Sig Start Date End Date Taking? Authorizing Provider  methocarbamol (ROBAXIN) 500 MG tablet Take 1 tablet (500 mg total) by mouth 2 (two) times daily. 07/18/22  Yes Rosilyn Coachman, MD  naproxen (NAPROSYN) 500 MG tablet Take 1 tablet (500 mg total) by mouth 2 (two) times daily. 07/18/22  Yes Derwood Kaplan, MD   acetaminophen (TYLENOL) 500 MG tablet Take 1 tablet (500 mg total) by mouth every 6 (six) hours as needed. Patient not taking: Reported on 03/27/2019 09/14/16   Emi Holes, PA-C  cyclobenzaprine (FLEXERIL) 10 MG tablet Take 1 tablet (10 mg total) by mouth 2 (two) times daily as needed for muscle spasms. 06/12/20   Mickie Bail, NP  ibuprofen (ADVIL) 800 MG tablet Take 1 tablet (800 mg total) by mouth every 8 (eight) hours as needed. 06/12/20   Mickie Bail, NP  lidocaine (LIDODERM) 5 % Place 1 patch onto the skin daily. Remove & Discard patch within 12 hours or as directed by MD 07/18/22   Derwood Kaplan, MD  ondansetron (ZOFRAN ODT) 8 MG disintegrating tablet 8mg  ODT q8 hours prn nausea 03/27/19   Palumbo, April, MD  predniSONE (DELTASONE) 10 MG tablet 3 tabs for 3 days, 2 tabs for 3 days, 1 tab for 3 days, Patient not taking: Reported on 11/19/2018 09/12/18   Donnetta Hutching, MD      Allergies    Medroxyprogesterone acetate, Valproic acid, Pork-derived products, and Penicillins    Review of Systems   Review of Systems  All other systems reviewed and are negative.   Physical Exam Updated Vital Signs BP 139/84   Pulse 76   Temp 98 F (36.7 C)   Resp (!) 24   Ht 6' (1.829 m)  Wt 135.6 kg   SpO2 100%   BMI 40.55 kg/m  Physical Exam Vitals and nursing note reviewed.  Constitutional:      Appearance: She is well-developed.  HENT:     Head: Atraumatic.  Cardiovascular:     Rate and Rhythm: Normal rate.  Pulmonary:     Effort: Pulmonary effort is normal.  Musculoskeletal:     Cervical back: Normal range of motion and neck supple.     Comments: Patient has reported tenderness over the left scapular region with palpation.  She also has tenderness in the left paracervical spine region and at the base of her skull.  Tenderness worse with patient turning her head to the left side.  Skin:    General: Skin is warm and dry.  Neurological:     Mental Status: She is alert and oriented to  person, place, and time.     GCS: GCS eye subscore is 4. GCS verbal subscore is 5. GCS motor subscore is 6.     ED Results / Procedures / Treatments   Labs (all labs ordered are listed, but only abnormal results are displayed) Labs Reviewed  CBC WITH DIFFERENTIAL/PLATELET - Abnormal; Notable for the following components:      Result Value   Hemoglobin 11.0 (*)    HCT 34.7 (*)    All other components within normal limits  BASIC METABOLIC PANEL    EKG None  Radiology CT ANGIO HEAD NECK W WO CM  Result Date: 07/18/2022 CLINICAL DATA:  Headache. Neck pain. Question vascular dissection or aneurysm. EXAM: CT ANGIOGRAPHY HEAD AND NECK WITH AND WITHOUT CONTRAST TECHNIQUE: Multidetector CT imaging of the head and neck was performed using the standard protocol during bolus administration of intravenous contrast. Multiplanar CT image reconstructions and MIPs were obtained to evaluate the vascular anatomy. Carotid stenosis measurements (when applicable) are obtained utilizing NASCET criteria, using the distal internal carotid diameter as the denominator. RADIATION DOSE REDUCTION: This exam was performed according to the departmental dose-optimization program which includes automated exposure control, adjustment of the mA and/or kV according to patient size and/or use of iterative reconstruction technique. CONTRAST:  75mL OMNIPAQUE IOHEXOL 350 MG/ML SOLN COMPARISON:  None Available. FINDINGS: CT HEAD FINDINGS Brain: No evidence of old or acute brain infarction. No mass, hemorrhage, hydrocephalus or extra-axial collection. Vascular: No abnormal finding for contrast administration. Skull: No skull fracture. Sinuses/Orbits: Paranasal sinuses are clear. Previous orbital floor reconstruction on the left for treatment blowout fracture. Other: None Review of the MIP images confirms the above findings CTA NECK FINDINGS Aortic arch: Normal.  Branching pattern is normal. Right carotid system: Common carotid artery  widely patent to the bifurcation. Carotid bifurcation is normal without soft or calcified plaque. Cervical ICA is normal. Left carotid system: Common carotid artery widely patent to the bifurcation. Bifurcation is normal without soft or calcified plaque. Cervical ICA is normal. Vertebral arteries: Both vertebral artery origins are widely patent. Both vertebral arteries are normal through the cervical region to the foramen magnum. Skeleton: Normal Other neck: Up to 3.7 cm nodule of the isthmus of the thyroid gland. This was previously evaluated by ultrasound in February of 2021 with a maximal dimension of 2.4 cm. Yearly sonographic follow-up was recommended at that time. Because of that recommendation and because of the enlargement, repeat thyroid ultrasound is recommended. Upper chest: Normal Review of the MIP images confirms the above findings CTA HEAD FINDINGS Anterior circulation: Both internal carotid arteries are patent through the skull base and  siphon regions. No siphon stenosis. The anterior and middle cerebral vessels are normal. No occlusion or stenosis. No aneurysm or vascular malformation. Posterior circulation: Both vertebral arteries are patent to the basilar artery. No basilar stenosis. Posterior circulation branch vessels are normal. Venous sinuses: Patent and normal. Anatomic variants: None significant. Review of the MIP images confirms the above findings IMPRESSION: 1. Normal head CT. 2. Normal CT angiography of the head and neck. 3. 3.7 cm nodule of the isthmus of the thyroid gland. This was previously evaluated by ultrasound in February of 2021 with a maximal dimension of 2.4 cm. Yearly sonographic follow-up was recommended at that time. Because of that recommendation and because of the enlargement, repeat thyroid ultrasound is recommended. Electronically Signed   By: Paulina Fusi M.D.   On: 07/18/2022 17:02    Procedures Procedures    Medications Ordered in ED Medications  lidocaine  (LIDODERM) 5 % 1 patch (1 patch Transdermal Patch Applied 07/18/22 1335)  lidocaine (LIDODERM) 5 % 1 patch (has no administration in time range)  diphenhydrAMINE (BENADRYL) injection 25 mg (25 mg Intravenous Given 07/18/22 1333)  prochlorperazine (COMPAZINE) injection 10 mg (10 mg Intravenous Given 07/18/22 1334)  diazepam (VALIUM) tablet 5 mg (5 mg Oral Given 07/18/22 1655)  fentaNYL (SUBLIMAZE) injection 100 mcg (100 mcg Intravenous Given 07/18/22 1656)  iohexol (OMNIPAQUE) 350 MG/ML injection 75 mL (75 mLs Intravenous Contrast Given 07/18/22 1637)    ED Course/ Medical Decision Making/ A&P                             Medical Decision Making Amount and/or Complexity of Data Reviewed Labs: ordered. Radiology: ordered.  Risk Prescription drug management.   52 year old patient comes in with chief complaint of severe neck, back and headache.  Patient has history of migraines, myocardial infarction.  She indicates that the pain is constant, severe, sudden and rated at 10 out of 10 from the onset.  So there are some features that are consistent with subarachnoid bleed.  Patient however has no risk factors for it.  She has no neurodeficits.  Neuroexam is reassuring and patient has reproducible pain with palpation of the left scapular, neck region.  Differential diagnosis considered for this patient includes subarachnoid hemorrhage, muscle spasms, migraines, muscular headaches, tension headaches.  Plan is to initiate basic treatment and get basic labs right now.  If patient's headache is better with headache cocktail, then we will not proceed with CT angiogram.  If the headache cocktail is not effective, then we will get CT angiogram.   Reassessment: On reassessment, patient states that her pain improved, but still is present at at least 6 out of 10 range.  She continues to have no neurodeficits.  I discussed with the patient my concerns that she most likely is having pain that is musculoskeletal in  nature, but she has some features that are more consistent with more severe type of headache.  I discussed with her that we can be safe and get CT angiogram head and neck to be more comfortable with her being discharged versus she going home and returning to the ER if her symptoms are getting worse.  Patient more comfortable with more comprehensive evaluation now.  CT angiogram ordered.  Reassessment: I have independently interpreted CT angiogram.  There is no evidence of brain bleed.  There is no aneurysm. Patient's blood pressure has remained stable here.  She received more pain medication, and her symptoms  have improved.  At this time, I feel comfortable not needing to LP the patient to rule out subarachnoid.  We will proceed with discharge.  Return precautions discussed.   Final Clinical Impression(s) / ED Diagnoses Final diagnoses:  Muscle spasticity    Rx / DC Orders ED Discharge Orders          Ordered    methocarbamol (ROBAXIN) 500 MG tablet  2 times daily        07/18/22 1815    lidocaine (LIDODERM) 5 %  Every 24 hours        07/18/22 1816    naproxen (NAPROSYN) 500 MG tablet  2 times daily        07/18/22 1816              Derwood Kaplan, MD 07/18/22 1929

## 2022-08-15 IMAGING — DX DG CHEST 2V
2 series · 2 of 2 positions shown · non-contrast
Comparison: 03/27/2019

CLINICAL DATA: Cough.  Chest pain.  Shortness of breath.

EXAM:
CHEST - 2 VIEW

[chest pa]
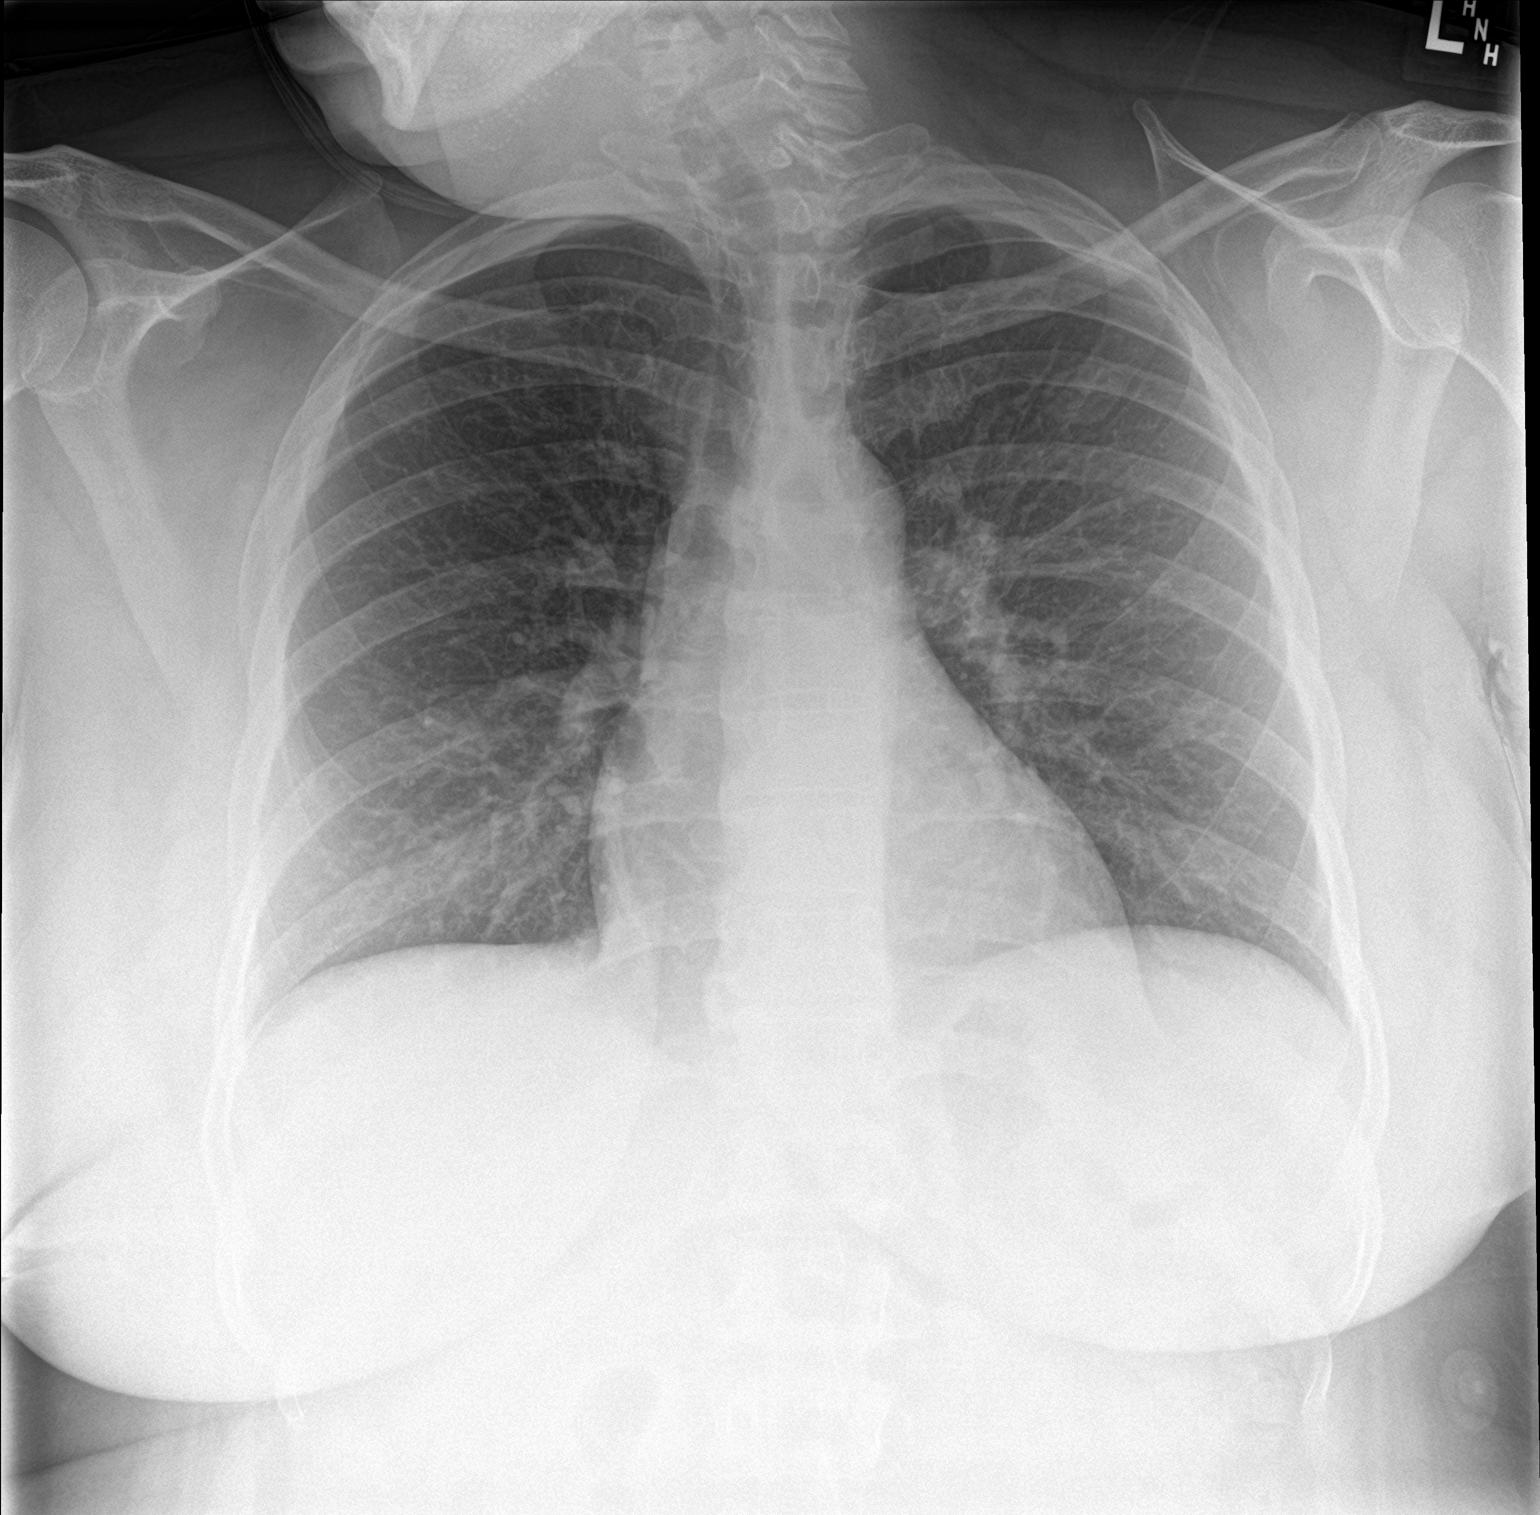

[chest lat]
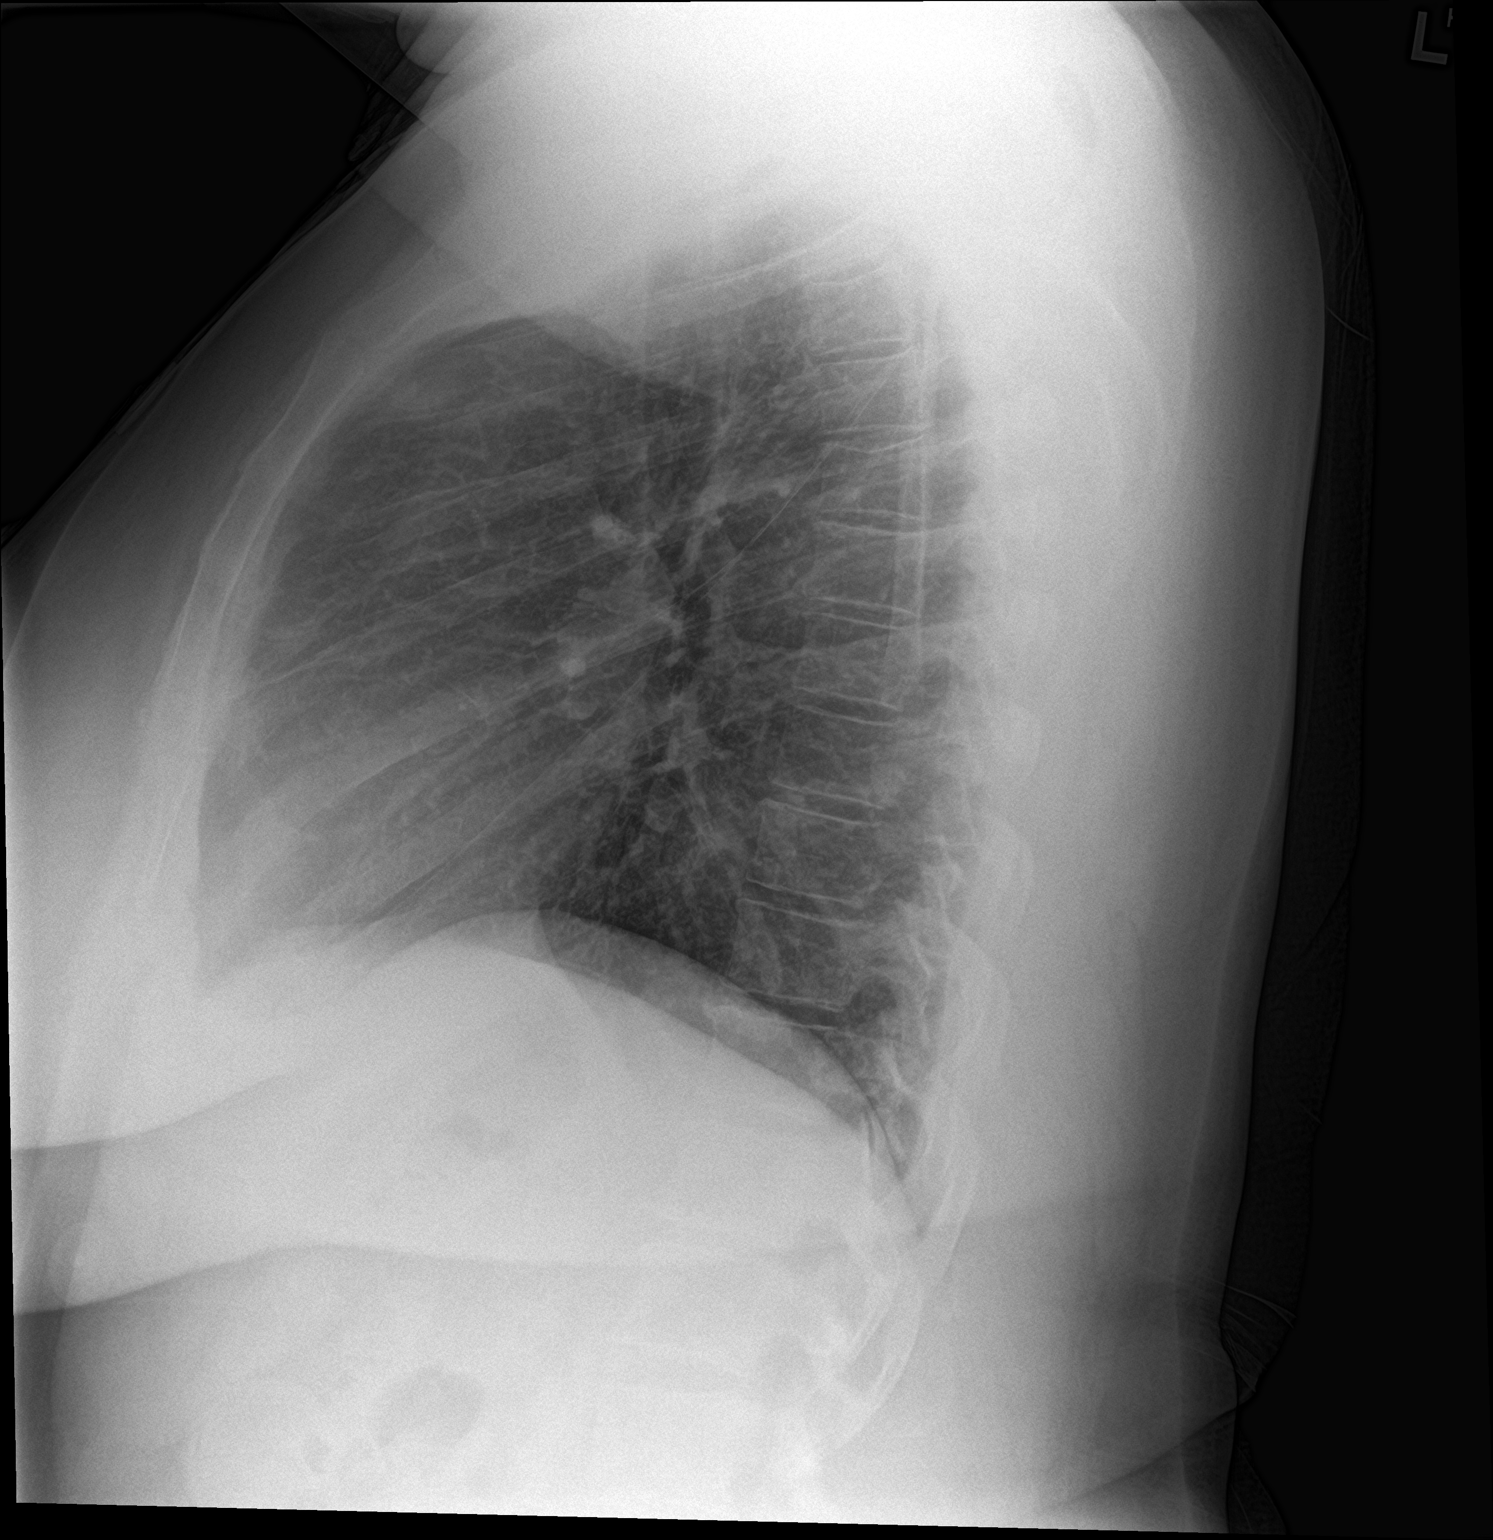

[2 of 2 positions shown; findings below may reference images not displayed]

FINDINGS: The cardiomediastinal contours are normal. Mild peribronchial
thickening. Pulmonary vasculature is normal. No consolidation,
pleural effusion, or pneumothorax. No acute osseous abnormalities
are seen.
IMPRESSION: Mild peribronchial thickening suggesting bronchitis or asthma.

## 2022-08-19 ENCOUNTER — Other Ambulatory Visit: Payer: Self-pay

## 2022-08-19 DIAGNOSIS — E041 Nontoxic single thyroid nodule: Secondary | ICD-10-CM

## 2022-08-24 ENCOUNTER — Ambulatory Visit
Admission: RE | Admit: 2022-08-24 | Discharge: 2022-08-24 | Disposition: A | Discharge status: Home / Self Care | Payer: Medicaid Other | Source: Ambulatory Visit | Attending: Family | Admitting: Family

## 2022-08-24 DIAGNOSIS — E041 Nontoxic single thyroid nodule: Secondary | ICD-10-CM

## 2022-09-03 ENCOUNTER — Other Ambulatory Visit (INDEPENDENT_AMBULATORY_CARE_PROVIDER_SITE_OTHER): Payer: Medicaid Other

## 2022-09-03 ENCOUNTER — Ambulatory Visit: Payer: Medicaid Other | Admitting: Sports Medicine

## 2022-09-03 VITALS — BP 128/84 | HR 84 | Ht 72.0 in | Wt 291.0 lb

## 2022-09-03 DIAGNOSIS — M62830 Muscle spasm of back: Secondary | ICD-10-CM | POA: Diagnosis not present

## 2022-09-03 DIAGNOSIS — M5442 Lumbago with sciatica, left side: Secondary | ICD-10-CM | POA: Diagnosis not present

## 2022-09-03 DIAGNOSIS — G8929 Other chronic pain: Secondary | ICD-10-CM | POA: Diagnosis not present

## 2022-09-03 DIAGNOSIS — M25512 Pain in left shoulder: Secondary | ICD-10-CM

## 2022-09-03 DIAGNOSIS — M5030 Other cervical disc degeneration, unspecified cervical region: Secondary | ICD-10-CM | POA: Diagnosis not present

## 2022-09-03 DIAGNOSIS — M545 Low back pain, unspecified: Secondary | ICD-10-CM

## 2022-09-03 MED ORDER — CELECOXIB 100 MG PO CAPS: 100.0000 mg | ORAL_CAPSULE | Freq: Two times a day (BID) | ORAL | 1 refills | Status: AC

## 2022-09-03 MED ORDER — BUPIVACAINE HCL 0.25 % IJ SOLN
1.0000 mL | INTRAMUSCULAR | Status: AC | PRN
  Administered 2022-09-03: 1 mL

## 2022-09-03 MED ORDER — LIDOCAINE HCL 1 % IJ SOLN
1.0000 mL | INTRAMUSCULAR | Status: AC | PRN
  Administered 2022-09-03: 1 mL

## 2022-09-03 MED ORDER — METHYLPREDNISOLONE ACETATE 40 MG/ML IJ SUSP
40.0000 mg | INTRAMUSCULAR | Status: AC | PRN
  Administered 2022-09-03: 40 mg via INTRAMUSCULAR
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
## 2022-09-22 ENCOUNTER — Encounter: Payer: Self-pay | Admitting: Physical Therapy

## 2022-09-22 ENCOUNTER — Ambulatory Visit: Payer: Medicaid Other | Attending: Nurse Practitioner | Admitting: Physical Therapy

## 2022-09-22 ENCOUNTER — Other Ambulatory Visit: Payer: Self-pay

## 2022-09-22 DIAGNOSIS — M6281 Muscle weakness (generalized): Secondary | ICD-10-CM | POA: Diagnosis present

## 2022-09-22 DIAGNOSIS — G8929 Other chronic pain: Secondary | ICD-10-CM

## 2022-09-22 DIAGNOSIS — M62830 Muscle spasm of back: Secondary | ICD-10-CM | POA: Insufficient documentation

## 2022-09-22 DIAGNOSIS — M542 Cervicalgia: Secondary | ICD-10-CM | POA: Diagnosis present

## 2022-09-22 DIAGNOSIS — M25512 Pain in left shoulder: Secondary | ICD-10-CM | POA: Insufficient documentation
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
## 2022-10-01 ENCOUNTER — Encounter: Payer: Self-pay | Admitting: Physical Therapy

## 2022-10-01 ENCOUNTER — Ambulatory Visit: Payer: Medicaid Other | Admitting: Physical Therapy

## 2022-10-01 DIAGNOSIS — M542 Cervicalgia: Secondary | ICD-10-CM | POA: Diagnosis not present

## 2022-10-01 DIAGNOSIS — M6281 Muscle weakness (generalized): Secondary | ICD-10-CM

## 2022-10-01 DIAGNOSIS — G8929 Other chronic pain: Secondary | ICD-10-CM

## 2022-10-01 NOTE — Patient Instructions (Signed)
Access Code: [email or web address] URL: [email or web address] Date: 10/01/2022 Prepared by: Garen Lah  Exercises - Seated Gentle Upper Trapezius Stretch  - 1 x daily - 7 x weekly - 1 sets - 3 reps - 30 hold - Gentle Levator Scapulae Stretch  - 1 x daily - 7 x weekly - 3 sets - 3 reps - 30 hold - Seated Assisted Cervical Rotation with Towel  - 1 x daily - 7 x weekly - 2-3 reps - 20 sec hold - Standing Shoulder Internal Rotation Stretch with Towel  - 1 x daily - 7 x weekly - 1 sets - 10 reps - Standing Shoulder Horizontal Abduction with Resistance  - 1 x daily - 7 x weekly - 3 sets - 10 reps - Standing Shoulder Diagonal Horizontal Abduction 60/120 Degrees with Resistance  - 1 x daily - 7 x weekly - 3 sets - 10 reps - Shoulder Extension with Resistance - Palms Forward  - 1 x daily - 7 x weekly - 3 sets - 10 reps - Standing Shoulder External Rotation with Resistance  - 1 x daily - 7 x weekly - 3 sets - 10 reps   Trigger Point Dry Needling  What is Trigger Point Dry Needling (DN)? DN is a physical therapy technique used to treat muscle pain and dysfunction. Specifically, DN helps deactivate muscle trigger points (muscle knots).  A thin filiform needle is used to penetrate the skin and stimulate the underlying trigger point. The goal is for a local twitch response (LTR) to occur and for the trigger point to relax. No medication of any kind is injected during the procedure.   What Does Trigger Point Dry Needling Feel Like?  The procedure feels different for each individual patient. Some patients report that they do not actually feel the needle enter the skin and overall the process is not painful. Very mild bleeding may occur. However, many patients feel a deep cramping in the muscle in which the needle was inserted. This is the local twitch response.   How Will I feel after the treatment? Soreness is normal, and the onset of soreness may not occur for a few hours. Typically this soreness  does not last longer than two days.  Bruising is uncommon, however; ice can be used to decrease any possible bruising.  In rare cases feeling tired or nauseous after the treatment is normal. In addition, your symptoms may get worse before they get better, this period will typically not last longer than 24 hours.   What Can I do After My Treatment? Increase your hydration by drinking more water for the next 24 hours. You may place ice or heat on the areas treated that have become sore, however, do not use heat on inflamed or bruised areas. Heat often brings more relief post needling. You can continue your regular activities, but vigorous activity is not recommended initially after the treatment for 24 hours. DN is best combined with other physical therapy such as strengthening, stretching, and other therapies.   Garen Lah, PT, [employer] Certified Exercise Expert for the Aging Adult  10/01/22 9:25 AM Phone: [phone] Fax: [phone]

## 2022-10-03 ENCOUNTER — Other Ambulatory Visit: Payer: Self-pay

## 2022-10-03 ENCOUNTER — Encounter (HOSPITAL_COMMUNITY): Payer: Self-pay

## 2022-10-03 ENCOUNTER — Emergency Department (HOSPITAL_COMMUNITY)
Admission: EM | Admit: 2022-10-03 | Discharge: 2022-10-03 | Disposition: A | Discharge status: Home / Self Care | Payer: Medicaid Other | Attending: Emergency Medicine | Admitting: Emergency Medicine

## 2022-10-03 DIAGNOSIS — T161XXA Foreign body in right ear, initial encounter: Secondary | ICD-10-CM | POA: Diagnosis present

## 2022-10-03 DIAGNOSIS — W448XXA Other foreign body entering into or through a natural orifice, initial encounter: Secondary | ICD-10-CM | POA: Diagnosis not present

## 2022-10-03 MED ORDER — CIPROFLOXACIN-DEXAMETHASONE 0.3-0.1 % OT SUSP: 4.0000 [drp] | Freq: Two times a day (BID) | OTIC | 0 refills | Status: DC

## 2022-10-03 NOTE — ED Triage Notes (Signed)
Pt arrived from home via [facility] c/o awaking to a bug in right ear. 10/10 pain

## 2022-10-03 NOTE — Discharge Instructions (Addendum)
As we discussed you did have a small insect crawling in the right ear, it spontaneously crawled out while I was examining the ear.  There is some irritation/bite marks noted on the tympanic membrane, please use the eardrops that I have prescribed for the next 5 days twice daily to help with irritation, and prevent infection.  Please follow-up if you have worsening pain despite treatment.  You may have some pain and irritation due to the trauma to the ear canal for a few days. Please use Tylenol or ibuprofen for pain.  You may use 600 mg ibuprofen every 6 hours or 1000 mg of Tylenol every 6 hours.  You may choose to alternate between the 2.  This would be most effective.  Not to exceed 4 g of Tylenol within 24 hours.  Not to exceed 3200 mg ibuprofen 24 hours.

## 2022-10-03 NOTE — ED Provider Notes (Signed)
[place] EMERGENCY DEPARTMENT AT [facility] Provider Note   CSN: [id number] Arrival date & time: 10/03/22  0865     History  Chief Complaint  Patient presents with   Foreign Body in Ear    [name] is a 52 y.o. female with overall noncontributory past medical history presents with concern for foreign body which she thinks is an insect in the right ear.  Patient reports that she woke up this morning, felt something crawling in the right ear.  Patient reports that she tried to place some castor oil in the ear and felt that the insect was biting at the tympanic membrane.  Patient reports that due to the irritation she stopped and decided to come to the emergency department so that he can be further assessed.  She denies any drainage from the ear.   Foreign Body in Ear       Home Medications Prior to Admission medications   Medication Sig Start Date End Date Taking? Authorizing Provider  ciprofloxacin-dexamethasone (CIPRODEX) OTIC suspension Place 4 drops into the right ear 2 (two) times daily. For 5 days 10/03/22  Yes Airanna Partin H, PA-C  acetaminophen (TYLENOL) 500 MG tablet Take 1 tablet (500 mg total) by mouth every 6 (six) hours as needed. 09/14/16   Emi Holes, PA-C  celecoxib (CELEBREX) 100 MG capsule Take 1 capsule (100 mg total) by mouth 2 (two) times daily. 09/03/22 11/02/22  Madelyn Brunner, DO  cyclobenzaprine (FLEXERIL) 10 MG tablet Take 1 tablet (10 mg total) by mouth 2 (two) times daily as needed for muscle spasms. Patient not taking: Reported on 09/22/2022 06/12/20   Mickie Bail, NP  ibuprofen (ADVIL) 800 MG tablet Take 1 tablet (800 mg total) by mouth every 8 (eight) hours as needed. 06/12/20   Mickie Bail, NP  lidocaine (LIDODERM) 5 % Place 1 patch onto the skin daily. Remove & Discard patch within 12 hours or as directed by MD Patient not taking: Reported on 09/22/2022 07/18/22   Derwood Kaplan, MD  methocarbamol (ROBAXIN) 500 MG tablet Take 1  tablet (500 mg total) by mouth 2 (two) times daily. 07/18/22   Derwood Kaplan, MD  ondansetron (ZOFRAN ODT) 8 MG disintegrating tablet 8mg  ODT q8 hours prn nausea 03/27/19   Palumbo, April, MD  predniSONE (DELTASONE) 10 MG tablet 3 tabs for 3 days, 2 tabs for 3 days, 1 tab for 3 days, Patient not taking: Reported on 09/22/2022 09/12/18   Donnetta Hutching, MD      Allergies    Medroxyprogesterone acetate, Valproic acid, Pork-derived products, and Penicillins    Review of Systems   Review of Systems  All other systems reviewed and are negative.   Physical Exam Updated Vital Signs BP 137/89 (BP Location: Left Arm)   Pulse (!) 109   Temp 98.1 F (36.7 C) (Oral)   Resp 20   SpO2 100%  Physical Exam Vitals and nursing note reviewed.  Constitutional:      General: She is not in acute distress.    Appearance: Normal appearance.  HENT:     Head: Normocephalic and atraumatic.     Ears:     Comments: On examination of the right ear canal there is a small insect which appears to be an ear wick which is crawling towards my ophthalmoscope.  Live insect exited the ear canal and was able to be killed, and thrown away.  There is a small amount of bleeding, irritation of the TM.  No evidence of large perforation. Eyes:     General:        Right eye: No discharge.        Left eye: No discharge.  Cardiovascular:     Rate and Rhythm: Normal rate and regular rhythm.  Pulmonary:     Effort: Pulmonary effort is normal. No respiratory distress.  Musculoskeletal:        General: No deformity.  Skin:    General: Skin is warm and dry.  Neurological:     Mental Status: She is alert and oriented to person, place, and time.  Psychiatric:        Mood and Affect: Mood normal.        Behavior: Behavior normal.     ED Results / Procedures / Treatments   Labs (all labs ordered are listed, but only abnormal results are displayed) Labs Reviewed - No data to display  EKG None  Radiology No results  found.  Procedures .Foreign Body Removal  Date/Time: 10/03/2022 7:01 AM  Performed by: Olene Floss, PA-C Authorized by: Olene Floss, PA-C  Consent: Verbal consent obtained. Risks and benefits: risks, benefits and alternatives were discussed Consent given by: patient Patient understanding: patient states understanding of the procedure being performed Patient identity confirmed: verbally with patient Body area: ear Location details: right ear Anesthesia method: none.  Sedation: Patient sedated: no  Patient restrained: no Patient cooperative: yes Localization method: ENT speculum Removal mechanism: ear scoop Complexity: simple 1 objects recovered. Objects recovered: Live Insect Post-procedure assessment: foreign body removed Patient tolerance: patient tolerated the procedure well with no immediate complications      Medications Ordered in ED Medications - No data to display  ED Course/ Medical Decision Making/ A&P                             Medical Decision Making  This patient is a 52 y.o. female who presents to the ED for concern of foreign body in right ear which she describes as likely insect   Differential diagnoses prior to evaluation: Foreign body versus otitis media, versus otitis externa  Past Medical History / Social History / Additional history: Chart reviewed. Pertinent results include: overall unremarkable  Physical Exam: Physical exam performed. The pertinent findings include:  On examination of the right ear canal there is a small insect which appears to be an ear wick which is crawling towards my ophthalmoscope.  Live insect exited the ear canal and was able to be killed, and thrown away.  There is a small amount of bleeding, irritation of the TM.  No evidence of large perforation. No active bleeding at this time.  Medications / Treatment: Insect removed from ear, patient will be discharged with ciprodex for infection ppx    Disposition: After consideration of the diagnostic results and the patients response to treatment, I feel that patient is stable for discharge at this time with insect successfully removed as above .   emergency department workup does not suggest an emergent condition requiring admission or immediate intervention beyond what has been performed at this time. The plan is: as above. The patient is safe for discharge and has been instructed to return immediately for worsening symptoms, change in symptoms or any other concerns.  Final Clinical Impression(s) / ED Diagnoses Final diagnoses:  Foreign body of right ear, initial encounter    Rx / DC Orders ED Discharge Orders  Ordered    ciprofloxacin-dexamethasone (CIPRODEX) OTIC suspension  2 times daily        10/03/22 0657              [place] 10/03/22 1610    Marily Memos, MD 10/03/22 [phone]
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
## 2022-10-06 ENCOUNTER — Ambulatory Visit: Payer: Medicaid Other | Admitting: Physical Therapy

## 2022-10-06 ENCOUNTER — Encounter: Payer: Self-pay | Admitting: Physical Therapy

## 2022-10-06 DIAGNOSIS — M542 Cervicalgia: Secondary | ICD-10-CM

## 2022-10-06 DIAGNOSIS — M6281 Muscle weakness (generalized): Secondary | ICD-10-CM

## 2022-10-06 DIAGNOSIS — G8929 Other chronic pain: Secondary | ICD-10-CM
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
## 2022-10-08 ENCOUNTER — Encounter: Payer: Self-pay | Admitting: Physical Therapy

## 2022-10-08 ENCOUNTER — Ambulatory Visit: Payer: Medicaid Other | Admitting: Physical Therapy

## 2022-10-08 DIAGNOSIS — M542 Cervicalgia: Secondary | ICD-10-CM | POA: Diagnosis not present

## 2022-10-08 DIAGNOSIS — M6281 Muscle weakness (generalized): Secondary | ICD-10-CM

## 2022-10-08 DIAGNOSIS — G8929 Other chronic pain: Secondary | ICD-10-CM

## 2022-10-13 ENCOUNTER — Ambulatory Visit: Payer: Medicaid Other | Admitting: Physical Therapy

## 2022-10-13 DIAGNOSIS — M542 Cervicalgia: Secondary | ICD-10-CM | POA: Diagnosis not present

## 2022-10-13 DIAGNOSIS — G8929 Other chronic pain: Secondary | ICD-10-CM

## 2022-10-13 DIAGNOSIS — M6281 Muscle weakness (generalized): Secondary | ICD-10-CM
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
## 2022-10-15 ENCOUNTER — Ambulatory Visit (INDEPENDENT_AMBULATORY_CARE_PROVIDER_SITE_OTHER): Payer: Medicaid Other | Admitting: Sports Medicine

## 2022-10-15 ENCOUNTER — Encounter: Payer: Self-pay | Admitting: Sports Medicine

## 2022-10-15 DIAGNOSIS — M25512 Pain in left shoulder: Secondary | ICD-10-CM

## 2022-10-15 DIAGNOSIS — M5030 Other cervical disc degeneration, unspecified cervical region: Secondary | ICD-10-CM | POA: Diagnosis not present

## 2022-10-15 DIAGNOSIS — S46812D Strain of other muscles, fascia and tendons at shoulder and upper arm level, left arm, subsequent encounter: Secondary | ICD-10-CM

## 2022-10-15 DIAGNOSIS — G8929 Other chronic pain: Secondary | ICD-10-CM

## 2022-10-15 NOTE — Progress Notes (Signed)
Still has a little bit of pain but nothing like it was she is able to stretch a lot more. Pt is going great. Celebrex is helping.   She does state that the pain is in her lower back but it isn't awful its mainly when she stands for a long time she has a burning sensation. She stated she is trying to walk more to lose weight.
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
## 2022-10-20 ENCOUNTER — Ambulatory Visit: Payer: Medicaid Other | Attending: Nurse Practitioner | Admitting: Physical Therapy

## 2022-10-20 DIAGNOSIS — M25512 Pain in left shoulder: Secondary | ICD-10-CM | POA: Diagnosis present

## 2022-10-20 DIAGNOSIS — M542 Cervicalgia: Secondary | ICD-10-CM | POA: Diagnosis present

## 2022-10-20 DIAGNOSIS — M6281 Muscle weakness (generalized): Secondary | ICD-10-CM | POA: Diagnosis present

## 2022-10-20 DIAGNOSIS — G8929 Other chronic pain: Secondary | ICD-10-CM | POA: Insufficient documentation

## 2022-10-22 ENCOUNTER — Ambulatory Visit: Payer: Medicaid Other | Admitting: Physical Therapy

## 2022-10-27 ENCOUNTER — Ambulatory Visit: Payer: Medicaid Other | Admitting: Physical Therapy
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
## 2022-10-29 ENCOUNTER — Ambulatory Visit: Payer: Medicaid Other | Admitting: Physical Therapy

## 2022-10-29 ENCOUNTER — Encounter: Payer: Self-pay | Admitting: Physical Therapy

## 2022-10-29 DIAGNOSIS — G8929 Other chronic pain: Secondary | ICD-10-CM

## 2022-10-29 DIAGNOSIS — M542 Cervicalgia: Secondary | ICD-10-CM | POA: Diagnosis not present

## 2022-10-29 DIAGNOSIS — M6281 Muscle weakness (generalized): Secondary | ICD-10-CM

## 2022-11-03 ENCOUNTER — Ambulatory Visit: Payer: Medicaid Other | Admitting: Physical Therapy

## 2022-11-03 ENCOUNTER — Encounter: Payer: Self-pay | Admitting: Physical Therapy

## 2022-11-03 DIAGNOSIS — M6281 Muscle weakness (generalized): Secondary | ICD-10-CM

## 2022-11-03 DIAGNOSIS — M542 Cervicalgia: Secondary | ICD-10-CM | POA: Diagnosis not present

## 2022-11-03 DIAGNOSIS — G8929 Other chronic pain: Secondary | ICD-10-CM
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
## 2022-11-05 ENCOUNTER — Ambulatory Visit: Payer: Medicaid Other | Admitting: Physical Therapy

## 2022-12-15 ENCOUNTER — Ambulatory Visit: Payer: Medicaid Other | Admitting: Sports Medicine

## 2022-12-18 ENCOUNTER — Encounter: Payer: Self-pay | Admitting: Sports Medicine

## 2022-12-18 ENCOUNTER — Ambulatory Visit (INDEPENDENT_AMBULATORY_CARE_PROVIDER_SITE_OTHER): Payer: Medicaid Other | Admitting: Sports Medicine

## 2022-12-18 VITALS — Wt 282.2 lb

## 2022-12-18 DIAGNOSIS — G5702 Lesion of sciatic nerve, left lower limb: Secondary | ICD-10-CM

## 2022-12-18 DIAGNOSIS — M5442 Lumbago with sciatica, left side: Secondary | ICD-10-CM | POA: Diagnosis not present

## 2022-12-18 DIAGNOSIS — E66813 Obesity, class 3: Secondary | ICD-10-CM | POA: Diagnosis not present

## 2022-12-18 DIAGNOSIS — G8929 Other chronic pain: Secondary | ICD-10-CM | POA: Diagnosis not present

## 2022-12-18 DIAGNOSIS — M25512 Pain in left shoulder: Secondary | ICD-10-CM | POA: Diagnosis not present

## 2022-12-18 MED ORDER — METHYLPREDNISOLONE 4 MG PO TBPK: ORAL_TABLET | ORAL | 0 refills | Status: DC

## 2022-12-18 NOTE — Progress Notes (Signed)
Patient states that she is feeling better. She says that she feels pain in her shoulder from time to time, but that most of the time it feels good. She says that she has started having some pain in the middle of her back (points towards midline) and some of her sciatic nerve pain. Patient takes muscle relaxers as needed which has been about once or twice every other week. Patient says she finished therapy about a month ago but still does her exercises at home.  Patient was instructed in 10 minutes of therapeutic exercises for low back and left hip pain to improve strength, ROM and function according to my instructions and plan of care by a Certified Athletic Trainer during the office visit. A customized handout was provided and demonstration of proper technique shown and discussed. Patient did perform exercises and demonstrate understanding through teachback.  All questions discussed and answered.
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
## 2023-02-28 ENCOUNTER — Other Ambulatory Visit: Payer: Self-pay

## 2023-02-28 ENCOUNTER — Emergency Department (HOSPITAL_COMMUNITY): Payer: Medicaid Other

## 2023-02-28 ENCOUNTER — Emergency Department (HOSPITAL_COMMUNITY)
Admission: EM | Admit: 2023-02-28 | Discharge: 2023-02-28 | Disposition: A | Discharge status: Home / Self Care | Payer: Medicaid Other

## 2023-02-28 ENCOUNTER — Encounter (HOSPITAL_COMMUNITY): Payer: Self-pay

## 2023-02-28 DIAGNOSIS — M79604 Pain in right leg: Secondary | ICD-10-CM | POA: Diagnosis present

## 2023-02-28 DIAGNOSIS — M79651 Pain in right thigh: Secondary | ICD-10-CM | POA: Diagnosis not present

## 2023-02-28 MED ORDER — ACETAMINOPHEN 500 MG PO TABS
1000.0000 mg | ORAL_TABLET | Freq: Once | ORAL | Status: AC
  Administered 2023-02-28: 1000 mg via ORAL
  Filled 2023-02-28: qty 2

## 2023-02-28 MED ORDER — KETOROLAC TROMETHAMINE 60 MG/2ML IM SOLN
15.0000 mg | Freq: Once | INTRAMUSCULAR | Status: AC
  Administered 2023-02-28: 15 mg via INTRAMUSCULAR
  Filled 2023-02-28: qty 2

## 2023-02-28 MED ORDER — METHOCARBAMOL 500 MG PO TABS
500.0000 mg | ORAL_TABLET | Freq: Four times a day (QID) | ORAL | 0 refills | Status: AC | PRN
Start: 2023-02-28 — End: ?

## 2023-02-28 MED ORDER — LIDOCAINE 5 % EX PTCH
1.0000 | MEDICATED_PATCH | Freq: Once | CUTANEOUS | Status: DC
  Administered 2023-02-28: 1 via TRANSDERMAL
  Filled 2023-02-28: qty 1

## 2023-02-28 MED ORDER — METHOCARBAMOL 500 MG PO TABS
500.0000 mg | ORAL_TABLET | Freq: Once | ORAL | Status: AC
  Administered 2023-02-28: 500 mg via ORAL
  Filled 2023-02-28: qty 1

## 2023-02-28 NOTE — Discharge Instructions (Signed)
I suspect your right leg pain is due to back and muscle strain.  Please take 400 mg of ibuprofen starting tomorrow morning.  You should take this every 6 hours.  Be sure to always take this with food.  You should also take Tylenol 1000 mg every 6 hours.  You need to alternate these medications so that you are taking a medicine every 3 hours as we discussed previously.  You were discharged with a prescription for a muscle relaxer, Robaxin.  This medicine can cause you to be sleepy.  Be sure that you are not taking this before you go to work, drive, or operate heavy machinery.  Return to the emergency department if you develop numbness in your right leg, severe worsening of your right leg pain, difficulty urinating, urinary incontinence, or swelling of your right leg.  Please follow-up with your primary care doctor Wednesday morning if you are still having pain or any other symptoms you are concerned about.

## 2023-02-28 NOTE — ED Triage Notes (Signed)
Pt c/o right hip pain radiating to right knee started at 0200. Pt states she went to work and noticed her right knee is facing inward. Pt denies injury. Unable to assess pt knee in triage at this time, she has jeans on

## 2023-03-01 NOTE — ED Provider Notes (Signed)
[place] EMERGENCY DEPARTMENT AT [facility] Provider Note   CSN: [id number] Arrival date & time: 02/28/23  1812     History  No chief complaint on file.   [name] is a 52 y.o. female.  52 year old female with past medical history of right sided chronic sciatica presents here for right lower extremity pain that woke her from sleep around 2 AM.  Patient states that the pain started in her right thigh, and she is now having pain all over her right leg.  Her pain is provoked with movement, which is not consistent with her sciatica pain.  She denies any associated numbness.  Not having any urinary incontinence or difficulties urinating.  Denies saddle anesthesia.  No recent trauma to the right lower extremity.  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  The history is provided by the patient.       Home Medications Prior to Admission medications   Medication Sig Start Date End Date Taking? Authorizing Provider  methocarbamol (ROBAXIN) 500 MG tablet Take 1 tablet (500 mg total) by mouth every 6 (six) hours as needed for muscle spasms. 02/28/23  Yes Rolla Flatten, MD  acetaminophen (TYLENOL) 500 MG tablet Take 1 tablet (500 mg total) by mouth every 6 (six) hours as needed. 09/14/16   Law, Waylan Boga, PA-C  ciprofloxacin-dexamethasone (CIPRODEX) OTIC suspension Place 4 drops into the right ear 2 (two) times daily. For 5 days 10/03/22   Prosperi, Christian H, PA-C  cyclobenzaprine (FLEXERIL) 10 MG tablet Take 1 tablet (10 mg total) by mouth 2 (two) times daily as needed for muscle spasms. Patient not taking: Reported on 09/22/2022 06/12/20   Mickie Bail, NP  ibuprofen (ADVIL) 800 MG tablet Take 1 tablet (800 mg total) by mouth every 8 (eight) hours as needed. 06/12/20   Mickie Bail, NP  methylPREDNISolone (MEDROL DOSEPAK) 4 MG TBPK tablet Take per packet instruction. Taper dosing. 12/18/22   Madelyn Brunner, DO  ondansetron  (ZOFRAN ODT) 8 MG disintegrating tablet 8mg  ODT q8 hours prn nausea 03/27/19   Palumbo, April, MD      Allergies    Medroxyprogesterone acetate, Valproic acid, Pork-derived products, and Penicillins    Review of Systems   As noted in HPI  Physical Exam Updated Vital Signs BP (!) 149/91   Pulse 64   Temp 98.3 F (36.8 C)   Resp 18   Ht 6' (1.829 m)   Wt 128 kg   SpO2 100%   BMI 38.27 kg/m  Physical Exam Vitals reviewed.  Constitutional:      General: She is not in acute distress.    Appearance: She is obese. She is not ill-appearing, toxic-appearing or diaphoretic.  Cardiovascular:     Rate and Rhythm: Normal rate and regular rhythm.     Pulses: Normal pulses.          Dorsalis pedis pulses are 2+ on the right side and 2+ on the left side.     Heart sounds: Normal heart sounds. No murmur heard.    No friction rub. No gallop.  Pulmonary:     Effort: Pulmonary effort is normal. No respiratory distress.     Breath sounds: Normal breath sounds. No wheezing, rhonchi or rales.  Abdominal:     General: There is no distension.     Palpations: Abdomen is soft.     Tenderness: There is no abdominal tenderness. There is  no guarding or rebound.  Musculoskeletal:     Right lower leg: No edema.     Left lower leg: No edema.     Comments: Right lower extremity diffusely tender to palpation.  Right gluteal and right sided low back also tender to palpation.  No superimposed erythema or associated swelling noted.  No deformities or external signs of trauma noted to the right lower extremity.  Skin:    General: Skin is warm and dry.  Neurological:     Mental Status: She is alert.     Comments: Sensation intact and symmetrical to bilateral lower extremities.     ED Results / Procedures / Treatments   Labs (all labs ordered are listed, but only abnormal results are displayed) Labs Reviewed - No data to display  EKG None  Radiology DG Knee Complete 4 Views Right Result Date:  02/28/2023 CLINICAL DATA:  Knee pain EXAM: RIGHT KNEE - COMPLETE 4+ VIEW COMPARISON:  None Available. FINDINGS: No evidence of fracture, dislocation, or joint effusion. No evidence of arthropathy or other focal bone abnormality. Soft tissues are unremarkable. IMPRESSION: Negative. Electronically Signed   By: Jasmine Pang M.D.   On: 02/28/2023 19:00   DG Hip Unilat W or Wo Pelvis 2-3 Views Right Result Date: 02/28/2023 CLINICAL DATA:  Right hip and knee pain EXAM: DG HIP (WITH OR WITHOUT PELVIS) 2-3V RIGHT COMPARISON:  None Available. FINDINGS: There is no evidence of hip fracture or dislocation. There is no evidence of arthropathy or other focal bone abnormality. IMPRESSION: Negative. Electronically Signed   By: Jasmine Pang M.D.   On: 02/28/2023 19:00    Procedures Procedures    Medications Ordered in ED Medications  lidocaine (LIDODERM) 5 % 1 patch (1 patch Transdermal Patch Applied 02/28/23 1949)  ketorolac (TORADOL) injection 15 mg (15 mg Intramuscular Given 02/28/23 1953)  acetaminophen (TYLENOL) tablet 1,000 mg (1,000 mg Oral Given 02/28/23 1948)  methocarbamol (ROBAXIN) tablet 500 mg (500 mg Oral Given 02/28/23 2130)    ED Course/ Medical Decision Making/ A&P                                 Medical Decision Making Amount and/or Complexity of Data Reviewed Radiology: ordered and independent interpretation performed.  Risk OTC drugs. Prescription drug management.   52 year old female presents here for atraumatic right lower extremity pain.  Vitals reassuring on presentation.  On exam, patient is nontoxic-appearing and in no acute distress.  Lower extremity is neurovascularly intact.  No swelling, erythema, warmth, or deformities are noted to the right lower extremity.  The extremity is diffusely and exquisitely tender to palpation.  Has tenderness leading all the way up to her right buttock and right lower back region as well.  Differential includes muscle strain/sprain,  exacerbation of chronic sciatica, acute fracture, osteoarthritis, DVT.  Patient has no lower extremity edema to indicate DVT.  We did obtain x-rays of the right hip and right knee in the triage process.  I independently interpreted these images, which are without evidence of acute fracture or other injuries.  Imaging does not appear consistent with severe osteoarthritis.  Given how diffuse patient's pain is, I suspect symptoms are more related to her chronic sciatica.  Will treat patient's pain with topical lidocaine patch, Tylenol, Toradol, Robaxin.  On reassessment, patient has had improvement in her pain from 10/10 to 7/10.  Patient is felt to be appropriate for discharge at this  time.  Will plan to discharge with a prescription for Robaxin.  Recommend continued pain management with over-the-counter Tylenol and ibuprofen.  Return precautions were discussed with the patient at the time of discharge.  Discharged in stable condition.  Patient's presentation is most consistent with acute complicated illness / injury requiring diagnostic workup.         Final Clinical Impression(s) / ED Diagnoses Final diagnoses:  Right leg pain    Rx / DC Orders ED Discharge Orders          Ordered    methocarbamol (ROBAXIN) 500 MG tablet  Every 6 hours PRN        02/28/23 2218              Rolla Flatten, MD 03/01/23 0053    Coral Spikes, DO 03/03/23 1454

## 2023-05-03 ENCOUNTER — Emergency Department (HOSPITAL_BASED_OUTPATIENT_CLINIC_OR_DEPARTMENT_OTHER): Payer: Medicaid Other | Admitting: Radiology

## 2023-05-03 ENCOUNTER — Encounter (HOSPITAL_BASED_OUTPATIENT_CLINIC_OR_DEPARTMENT_OTHER): Payer: Self-pay | Admitting: *Deleted

## 2023-05-03 ENCOUNTER — Other Ambulatory Visit: Payer: Self-pay

## 2023-05-03 ENCOUNTER — Emergency Department (HOSPITAL_BASED_OUTPATIENT_CLINIC_OR_DEPARTMENT_OTHER)
Admission: EM | Admit: 2023-05-03 | Discharge: 2023-05-03 | Disposition: A | Discharge status: Home / Self Care | Payer: Medicaid Other | Attending: Emergency Medicine | Admitting: Emergency Medicine

## 2023-05-03 DIAGNOSIS — Z87891 Personal history of nicotine dependence: Secondary | ICD-10-CM | POA: Insufficient documentation

## 2023-05-03 DIAGNOSIS — J209 Acute bronchitis, unspecified: Secondary | ICD-10-CM | POA: Diagnosis not present

## 2023-05-03 DIAGNOSIS — J069 Acute upper respiratory infection, unspecified: Secondary | ICD-10-CM | POA: Diagnosis present

## 2023-05-03 LAB — BASIC METABOLIC PANEL
Anion gap: 8 (ref 5–15)
BUN: 13 mg/dL (ref 6–20)
CO2: 24 mmol/L (ref 22–32)
Calcium: 9.2 mg/dL (ref 8.9–10.3)
Chloride: 106 mmol/L (ref 98–111)
Creatinine, Ser: 0.69 mg/dL (ref 0.44–1.00)
GFR, Estimated: >60 mL/min (ref >60)
Glucose, Bld: 79 mg/dL (ref 70–99)
Potassium: 3.6 mmol/L (ref 3.5–5.1)
Sodium: 138 mmol/L (ref 135–145)

## 2023-05-03 LAB — RESP PANEL BY RT-PCR (RSV, FLU A&B, COVID)  RVPGX2
Influenza A by PCR: NEGATIVE
Influenza B by PCR: NEGATIVE
Resp Syncytial Virus by PCR: NEGATIVE
SARS Coronavirus 2 by RT PCR: NEGATIVE

## 2023-05-03 LAB — CBC
HCT: 34.1 % — ABNORMAL LOW (ref 36.0–46.0)
Hemoglobin: 11.3 g/dL — ABNORMAL LOW (ref 12.0–15.0)
MCH: 28.6 pg (ref 26.0–34.0)
MCHC: 33.1 g/dL (ref 30.0–36.0)
MCV: 86.3 fL (ref 80.0–100.0)
Platelets: 241 10*3/uL (ref 150–400)
RBC: 3.95 MIL/uL (ref 3.87–5.11)
RDW: 12.8 % (ref 11.5–15.5)
WBC: 6.5 10*3/uL (ref 4.0–10.5)
nRBC: 0 % (ref 0.0–0.2)

## 2023-05-03 LAB — TROPONIN I (HIGH SENSITIVITY): Troponin I (High Sensitivity): <2 ng/L (ref <18)

## 2023-05-03 MED ORDER — BENZONATATE 100 MG PO CAPS: 100.0000 mg | ORAL_CAPSULE | Freq: Three times a day (TID) | ORAL | 0 refills | Status: DC

## 2023-05-03 MED ORDER — DOXYCYCLINE HYCLATE 100 MG PO TABS
100.0000 mg | ORAL_TABLET | Freq: Once | ORAL | Status: AC
  Administered 2023-05-03: 100 mg via ORAL
  Filled 2023-05-03: qty 1

## 2023-05-03 MED ORDER — PREDNISONE 10 MG PO TABS: 40.0000 mg | ORAL_TABLET | Freq: Every day | ORAL | 0 refills | Status: AC

## 2023-05-03 MED ORDER — DOXYCYCLINE HYCLATE 100 MG PO CAPS: 100.0000 mg | ORAL_CAPSULE | Freq: Two times a day (BID) | ORAL | 0 refills | Status: AC

## 2023-05-03 MED ORDER — PREDNISONE 20 MG PO TABS
40.0000 mg | ORAL_TABLET | Freq: Once | ORAL | Status: AC
  Administered 2023-05-03: 40 mg via ORAL
  Filled 2023-05-03: qty 2

## 2023-05-03 NOTE — ED Notes (Signed)
Pt states she has had  URI x 2 weeks, coughing etc Also c/o dizziness with occ CP and weakness

## 2023-05-03 NOTE — ED Notes (Signed)
Pt states that she has been having CP and sob which began before her URI began and would like her heart evaluated.  Protocols entered.

## 2023-05-03 NOTE — ED Triage Notes (Signed)
Pt here with URI x 2 weeks.  Pt was seen at PCP and pt was sent here for further testing.

## 2023-05-03 NOTE — Discharge Instructions (Signed)
You were seen in the ER today with concerns of cough and congestion. Your labs and imaging were thankfully negative and I suspect you currently have bronchitis. I have sent a prescription for antibiotics and steroids to take for the next 5 days. Please take these as prescribed. For any new or worsening symptoms, return to the ER. Otherwise, please follow up with your PCP.

## 2023-05-07 NOTE — ED Provider Notes (Signed)
[place] EMERGENCY DEPARTMENT AT [facility] Provider Note   CSN: [id number] Arrival date & time: 05/03/23  1626     History Chief Complaint  Patient presents with   URI    [name] is a 53 y.o. female.  Patient with past history significant for MI, cancer, anemia, bronchitis presents emergency department concerns of URI symptoms.  She reports experiencing URI type symptoms for about 2 weeks without notable improvement.  Was seen by PCP earlier today was advised to come to the emergency department for evaluation given history of prior MI.  Does endorse some chest discomfort but denies any hemoptysis, chest pain, or shortness of breath.  No recent leg swelling.  No prior history of PE or DVT.  Not currently on blood thinners.   URI Presenting symptoms: congestion and fatigue        Home Medications Prior to Admission medications   Medication Sig Start Date End Date Taking? Authorizing Provider  benzonatate (TESSALON) 100 MG capsule Take 1 capsule (100 mg total) by mouth every 8 (eight) hours. 05/03/23  Yes Smitty Knudsen, PA-C  doxycycline (VIBRAMYCIN) 100 MG capsule Take 1 capsule (100 mg total) by mouth 2 (two) times daily for 5 days. 05/03/23 05/08/23 Yes Smitty Knudsen, PA-C  predniSONE (DELTASONE) 10 MG tablet Take 4 tablets (40 mg total) by mouth daily for 5 days. 05/03/23 05/08/23 Yes Smitty Knudsen, PA-C  acetaminophen (TYLENOL) 500 MG tablet Take 1 tablet (500 mg total) by mouth every 6 (six) hours as needed. 09/14/16   Law, Waylan Boga, PA-C  ciprofloxacin-dexamethasone (CIPRODEX) OTIC suspension Place 4 drops into the right ear 2 (two) times daily. For 5 days 10/03/22   Prosperi, Christian H, PA-C  cyclobenzaprine (FLEXERIL) 10 MG tablet Take 1 tablet (10 mg total) by mouth 2 (two) times daily as needed for muscle spasms. Patient not taking: Reported on 09/22/2022 06/12/20   Mickie Bail, NP  ibuprofen (ADVIL) 800 MG tablet Take 1 tablet (800 mg total) by mouth every  8 (eight) hours as needed. 06/12/20   Mickie Bail, NP  methocarbamol (ROBAXIN) 500 MG tablet Take 1 tablet (500 mg total) by mouth every 6 (six) hours as needed for muscle spasms. 02/28/23   Rolla Flatten, MD  methylPREDNISolone (MEDROL DOSEPAK) 4 MG TBPK tablet Take per packet instruction. Taper dosing. 12/18/22   Madelyn Brunner, DO  ondansetron (ZOFRAN ODT) 8 MG disintegrating tablet 8mg  ODT q8 hours prn nausea 03/27/19   Palumbo, April, MD      Allergies    Medroxyprogesterone acetate, Valproic acid, Pork-derived products, and Penicillins    Review of Systems   Review of Systems  Constitutional:  Positive for fatigue.  HENT:  Positive for congestion.   All other systems reviewed and are negative.   Physical Exam Updated Vital Signs BP (!) 151/105   Pulse (!) 56   Temp 98.4 F (36.9 C)   Resp 11   SpO2 100%  Physical Exam Vitals and nursing note reviewed.  Constitutional:      General: She is not in acute distress.    Appearance: She is well-developed.  HENT:     Head: Normocephalic and atraumatic.  Eyes:     Conjunctiva/sclera: Conjunctivae normal.  Cardiovascular:     Rate and Rhythm: Normal rate and regular rhythm.     Heart sounds: No murmur heard. Pulmonary:     Effort: Pulmonary effort is normal. No respiratory distress.     Breath sounds: Normal breath  sounds. No wheezing or rales.  Abdominal:     Palpations: Abdomen is soft.     Tenderness: There is no abdominal tenderness.  Musculoskeletal:        General: No swelling.     Cervical back: Neck supple.  Skin:    General: Skin is warm and dry.     Capillary Refill: Capillary refill takes less than 2 seconds.  Neurological:     Mental Status: She is alert.  Psychiatric:        Mood and Affect: Mood normal.     ED Results / Procedures / Treatments   Labs (all labs ordered are listed, but only abnormal results are displayed) Labs Reviewed  CBC - Abnormal; Notable for the following components:      Result  Value   Hemoglobin 11.3 (*)    HCT 34.1 (*)    All other components within normal limits  RESP PANEL BY RT-PCR (RSV, FLU A&B, COVID)  RVPGX2  BASIC METABOLIC PANEL  TROPONIN I (HIGH SENSITIVITY)    EKG EKG Interpretation Date/Time:  Monday May 03 2023 20:15:50 EST Ventricular Rate:  60 PR Interval:  163 QRS Duration:  101 QT Interval:  406 QTC Calculation: 406 R Axis:   -12  Text Interpretation: Sinus rhythm Low voltage, precordial leads Probable anteroseptal infarct, old Confirmed by Eber Hong ([id number]) on 05/04/2023 10:05:13 AM  Radiology No results found.  Procedures Procedures    Medications Ordered in ED Medications  doxycycline (VIBRA-TABS) tablet 100 mg (100 mg Oral Given 05/03/23 2058)  predniSONE (DELTASONE) tablet 40 mg (40 mg Oral Given 05/03/23 2058)    ED Course/ Medical Decision Making/ A&P                                 Medical Decision Making Amount and/or Complexity of Data Reviewed Labs: ordered. Radiology: ordered.  Risk Prescription drug management.   This patient presents to the ED for concern of URI.  Differential diagnosis includes COVID-19, pneumonia, bronchitis, influenza   Lab Tests:  I Ordered, and personally interpreted labs.  The pertinent results include: CBC unremarkable, BMP unremarkable, troponin negative, respiratory panel negative   Imaging Studies ordered:  I ordered imaging studies including chest x-ray I independently visualized and interpreted imaging which showed no acute cardiopulmonary process seen I agree with the radiologist interpretation   Medicines ordered and prescription drug management:  I ordered medication including prednisone, doxycycline for bronchitis Reevaluation of the patient after these medicines showed that the patient stayed the same I have reviewed the patients home medicines and have made adjustments as needed   Problem List / ED Course:  Patient with past history significant for  MI, bronchitis, anemia, cancer presents to the emergency department concerns of URI symptoms.  She states that this is been ongoing for about 2 weeks without notable improvement.  No prior history of COPD or asthma.  She states that she was seen by PCP earlier today once placed, the emergency department for some chest discomfort given her prior history of MI.  She denies any significant chest pain and states that this chest discomfort is been present over the last several weeks.  No recent mopped assist, leg swelling, recent surgery, or prior history of PE or DVT. On exam, patient is otherwise well-appearing but does appear to be somewhat uncomfortable with a cough present.  No abnormal lung sounds.  No appreciable heart murmur.  Will  obtain basic labs evaluation including troponin to assess chest discomfort. Lab workup thankfully reassuring without any abnormalities noted with exception of some baseline anemia with hemoglobin down to 11.3 which is consistent with baseline.  Chest x-ray is also unremarkable.  EKG shows normal sinus rhythm. Given reassuring workup, advised patient that she is likely stable for discharge home and outpatient follow-up with PCP again for further evaluation management of condition.  Given prolonged course of symptoms, this appears to be consistent with an acute bronchitis case and although questionable benefit of steroid and antibiotic use, will initiate doxycycline and prednisone for patient to attempt to address current symptoms.  I feel the benefits of treatment at this time outweigh potential risk given failure to improve after about 2 weeks of home treatment.  Will also send a prescription for Tessalon Perles to patient's pharmacy.  No other acute or focal concerns at this time but I feel warrant further evaluation.  Patient is agreeable with plans for discharge home and outpatient follow-up with PCP.  Final Clinical Impression(s) / ED Diagnoses Final diagnoses:  Acute  bronchitis, unspecified organism    Rx / DC Orders ED Discharge Orders          Ordered    doxycycline (VIBRAMYCIN) 100 MG capsule  2 times daily        05/03/23 2050    predniSONE (DELTASONE) 10 MG tablet  Daily        05/03/23 2050    benzonatate (TESSALON) 100 MG capsule  Every 8 hours        05/03/23 2050              Smitty Knudsen, PA-C 05/07/23 4098    Gwyneth Sprout, MD 05/07/23 [phone]

## 2023-06-25 ENCOUNTER — Other Ambulatory Visit: Payer: Self-pay

## 2023-06-25 DIAGNOSIS — Z1231 Encounter for screening mammogram for malignant neoplasm of breast: Secondary | ICD-10-CM

## 2023-07-09 ENCOUNTER — Ambulatory Visit

## 2023-07-13 ENCOUNTER — Ambulatory Visit
Admission: RE | Admit: 2023-07-13 | Discharge: 2023-07-13 | Discharge status: Home / Self Care | Source: Ambulatory Visit

## 2023-07-13 DIAGNOSIS — Z1231 Encounter for screening mammogram for malignant neoplasm of breast: Secondary | ICD-10-CM

## 2023-07-22 ENCOUNTER — Ambulatory Visit (AMBULATORY_SURGERY_CENTER)

## 2023-07-22 VITALS — Ht 72.0 in | Wt 272.0 lb

## 2023-07-22 DIAGNOSIS — Z1211 Encounter for screening for malignant neoplasm of colon: Secondary | ICD-10-CM

## 2023-07-22 MED ORDER — NA SULFATE-K SULFATE-MG SULF 17.5-3.13-1.6 GM/177ML PO SOLN: 1.0000 | Freq: Once | ORAL | 0 refills | Status: AC

## 2023-07-22 NOTE — Progress Notes (Signed)
 No egg or soy allergy known to patient  No issues known to pt with past sedation with any surgeries or procedures Patient denies ever being told they had issues or difficulty with intubation  No FH of Malignant Hyperthermia Pt is not on diet pills nor GLP-1 medications Pt is not on home 02  Pt is not on blood thinners  Pt denies issues with chronic constipation  No A fib or A flutter Have any cardiac testing pending--no Ambulates independently
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
## 2023-08-05 ENCOUNTER — Ambulatory Visit (AMBULATORY_SURGERY_CENTER): Admitting: Pediatrics

## 2023-08-05 ENCOUNTER — Encounter: Payer: Self-pay | Admitting: Pediatrics

## 2023-08-05 VITALS — BP 125/75 | HR 70 | Temp 97.9°F | Resp 14 | Ht 72.0 in | Wt 272.0 lb

## 2023-08-05 DIAGNOSIS — K648 Other hemorrhoids: Secondary | ICD-10-CM

## 2023-08-05 DIAGNOSIS — K621 Rectal polyp: Secondary | ICD-10-CM

## 2023-08-05 DIAGNOSIS — Z1211 Encounter for screening for malignant neoplasm of colon: Secondary | ICD-10-CM | POA: Diagnosis not present

## 2023-08-05 DIAGNOSIS — D128 Benign neoplasm of rectum: Secondary | ICD-10-CM

## 2023-08-05 MED ORDER — SODIUM CHLORIDE 0.9 % IV SOLN
500.0000 mL | INTRAVENOUS | Status: DC
Start: 2023-08-05 — End: 2023-08-05

## 2023-08-05 NOTE — Progress Notes (Signed)
 Called to room to assist during endoscopic procedure.  Patient ID and intended procedure confirmed with present staff. Received instructions for my participation in the procedure from the performing physician.

## 2023-08-05 NOTE — Patient Instructions (Signed)
 Educational handout provided to patient related to Hemorrhoids& Polyps  Resume previous diet  Continue present medications  Awaiting pathology results  YOU HAD AN ENDOSCOPIC PROCEDURE TODAY AT THE [facility]:   Refer to the procedure report that was given to you for any specific questions about what was found during the examination.  If the procedure report does not answer your questions, please call your gastroenterologist to clarify.  If you requested that your care partner not be given the details of your procedure findings, then the procedure report has been included in a sealed envelope for you to review at your convenience later.  YOU SHOULD EXPECT: Some feelings of bloating in the abdomen. Passage of more gas than usual.  Walking can help get rid of the air that was put into your GI tract during the procedure and reduce the bloating. If you had a lower endoscopy (such as a colonoscopy or flexible sigmoidoscopy) you may notice spotting of blood in your stool or on the toilet paper. If you underwent a bowel prep for your procedure, you may not have a normal bowel movement for a few days.  Please Note:  You might notice some irritation and congestion in your nose or some drainage.  This is from the oxygen used during your procedure.  There is no need for concern and it should clear up in a day or so.  SYMPTOMS TO REPORT IMMEDIATELY:  Following lower endoscopy (colonoscopy or flexible sigmoidoscopy):  Excessive amounts of blood in the stool  Significant tenderness or worsening of abdominal pains  Swelling of the abdomen that is new, acute  Fever of 100F or higher  For urgent or emergent issues, a gastroenterologist can be reached at any hour by calling [phone]. Do not use MyChart messaging for urgent concerns.    DIET:  We do recommend a small meal at first, but then you may proceed to your regular diet.  Drink plenty of fluids but you should avoid alcoholic  beverages for 24 hours.  ACTIVITY:  You should plan to take it easy for the rest of today and you should NOT DRIVE or use heavy machinery until tomorrow (because of the sedation medicines used during the test).    FOLLOW UP: Our staff will call the number listed on your records the next business day following your procedure.  We will call around 7:15- 8:00 am to check on you and address any questions or concerns that you may have regarding the information given to you following your procedure. If we do not reach you, we will leave a message.     If any biopsies were taken you will be contacted by phone or by letter within the next 1-3 weeks.  Please call us at [phone] if you have not heard about the biopsies in 3 weeks.    SIGNATURES/CONFIDENTIALITY: You and/or your care partner have signed paperwork which will be entered into your electronic medical record.  These signatures attest to the fact that that the information above on your After Visit Summary has been reviewed and is understood.  Full responsibility of the confidentiality of this discharge information lies with you and/or your care-partner.

## 2023-08-05 NOTE — Progress Notes (Signed)
 Pt's states no medical or surgical changes since previsit or office visit.

## 2023-08-05 NOTE — Op Note (Signed)
 [facility] Patient Name: [name] Procedure Date: 08/05/2023 9:04 AM MRN: [id number] Endoscopist: Eugenia Hess , MD, [id number] Age: 53 Referring MD:  Date of Birth: 1970-09-14 Gender: Female Account #: [id number] Procedure:                Colonoscopy Indications:              Screening for colorectal malignant neoplasm, This                            is the patient's first colonoscopy Medicines:                Monitored Anesthesia Care Procedure:                Pre-Anesthesia Assessment:                           - Prior to the procedure, a History and Physical                            was performed, and patient medications and                            allergies were reviewed. The patient's tolerance of                            previous anesthesia was also reviewed. The risks                            and benefits of the procedure and the sedation                            options and risks were discussed with the patient.                            All questions were answered, and informed consent                            was obtained. Prior Anticoagulants: The patient has                            taken no anticoagulant or antiplatelet agents. ASA                            Grade Assessment: III - A patient with severe                            systemic disease. After reviewing the risks and                            benefits, the patient was deemed in satisfactory                            condition to undergo the procedure.  After obtaining informed consent, the colonoscope                            was passed under direct vision. Throughout the                            procedure, the patient's blood pressure, pulse, and                            oxygen saturations were monitored continuously. The                            Olympus Scope SN [id number] was introduced through the                            anus and advanced to  the cecum, identified by                            appendiceal orifice and ileocecal valve. The                            colonoscopy was performed without difficulty. The                            patient tolerated the procedure well. The quality                            of the bowel preparation was good. The ileocecal                            valve, appendiceal orifice, and rectum were                            photographed. Scope In: 9:08:46 AM Scope Out: 9:23:37 AM Scope Withdrawal Time: 0 hours 11 minutes 11 seconds  Total Procedure Duration: 0 hours 14 minutes 51 seconds  Findings:                 The perianal and digital rectal examinations were                            normal. Pertinent negatives include normal                            sphincter tone and no palpable rectal lesions.                           A 4 mm polyp was found in the rectum. The polyp was                            sessile. The polyp was removed with a cold snare.                            Resection and retrieval were complete. A few  other                            diminutive scattered polyps were noted with                            hyperplastic appearance and therefore not removed.                           Internal hemorrhoids were found during retroflexion. Complications:            No immediate complications. Estimated blood loss:                            Minimal. Estimated Blood Loss:     Estimated blood loss was minimal. Impression:               - One 4 mm polyp in the rectum, removed with a cold                            snare. Resected and retrieved.                           - Internal hemorrhoids. Recommendation:           - Discharge patient to home (ambulatory).                           - Await pathology results.                           - Repeat colonoscopy for surveillance based on                            pathology results.                           - The findings and  recommendations were discussed                            with the patient's family.                           - Return to referring physician.                           - Patient has a contact number available for                            emergencies. The signs and symptoms of potential                            delayed complications were discussed with the                            patient. Return to normal activities tomorrow.  Written discharge instructions were provided to the                            patient. Eugenia Hess, MD 08/05/2023 9:27:41 AM This report has been signed electronically.

## 2023-08-05 NOTE — Progress Notes (Signed)
 Vss nad trans to pacu

## 2023-08-06 ENCOUNTER — Telehealth: Payer: Self-pay

## 2023-08-06 NOTE — Telephone Encounter (Signed)
  Follow up Call-     08/05/2023    8:14 AM  Call back number  Post procedure Call Back phone  # [phone]  Permission to leave phone message Yes     Patient questions:  Do you have a fever, pain , or abdominal swelling? No. Pain Score  0 *  Have you tolerated food without any problems? Yes.    Have you been able to return to your normal activities? Yes.    Do you have any questions about your discharge instructions: Diet   No. Medications  No. Follow up visit  No.  Do you have questions or concerns about your Care? No.  Actions: * If pain score is 4 or above: No action needed, pain <4.

## 2023-08-10 ENCOUNTER — Ambulatory Visit: Payer: Self-pay | Admitting: Pediatrics

## 2023-08-10 LAB — SURGICAL PATHOLOGY

## 2023-08-24 ENCOUNTER — Encounter: Payer: Self-pay | Admitting: Obstetrics & Gynecology

## 2023-10-19 ENCOUNTER — Other Ambulatory Visit: Payer: Self-pay | Admitting: Family

## 2023-10-19 DIAGNOSIS — N950 Postmenopausal bleeding: Secondary | ICD-10-CM

## 2023-10-21 ENCOUNTER — Ambulatory Visit
Admission: RE | Admit: 2023-10-21 | Discharge: 2023-10-21 | Disposition: A | Discharge status: Home / Self Care | Source: Ambulatory Visit | Attending: Family | Admitting: Family

## 2023-10-21 DIAGNOSIS — N950 Postmenopausal bleeding: Secondary | ICD-10-CM

## 2023-11-09 ENCOUNTER — Encounter: Payer: Self-pay | Admitting: Family

## 2023-11-09 ENCOUNTER — Other Ambulatory Visit: Payer: Self-pay | Admitting: Family

## 2023-11-09 DIAGNOSIS — N83209 Unspecified ovarian cyst, unspecified side: Secondary | ICD-10-CM

## 2023-11-10 ENCOUNTER — Other Ambulatory Visit

## 2023-11-11 ENCOUNTER — Ambulatory Visit
Admission: RE | Admit: 2023-11-11 | Discharge: 2023-11-11 | Disposition: A | Discharge status: Home / Self Care | Source: Ambulatory Visit | Attending: Family | Admitting: Family

## 2023-11-11 DIAGNOSIS — N83209 Unspecified ovarian cyst, unspecified side: Secondary | ICD-10-CM

## 2023-11-21 IMAGING — MG MM DIGITAL SCREENING BILAT W/ TOMO AND CAD
8 series · 8 of 24 positions shown · non-contrast
Comparison: None.

CLINICAL DATA: Screening.

EXAM:
DIGITAL SCREENING BILATERAL MAMMOGRAM WITH TOMOSYNTHESIS AND CAD
TECHNIQUE: Bilateral screening digital craniocaudal and mediolateral oblique
mammograms were obtained. Bilateral screening digital breast
tomosynthesis was performed. The images were evaluated with
computer-aided detection.

[R CC synth-2D]
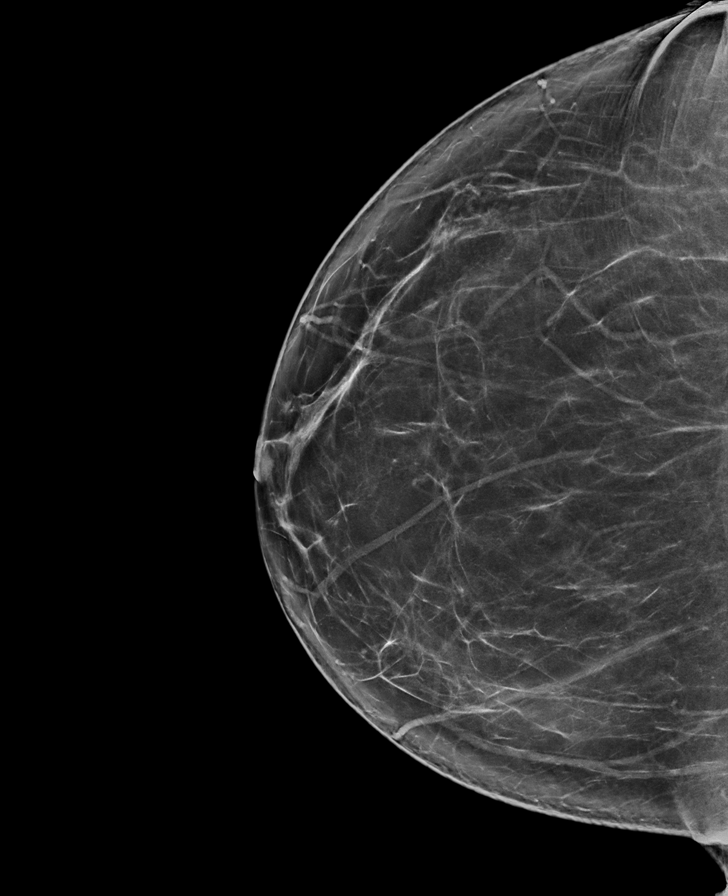

[L CC synth-2D]
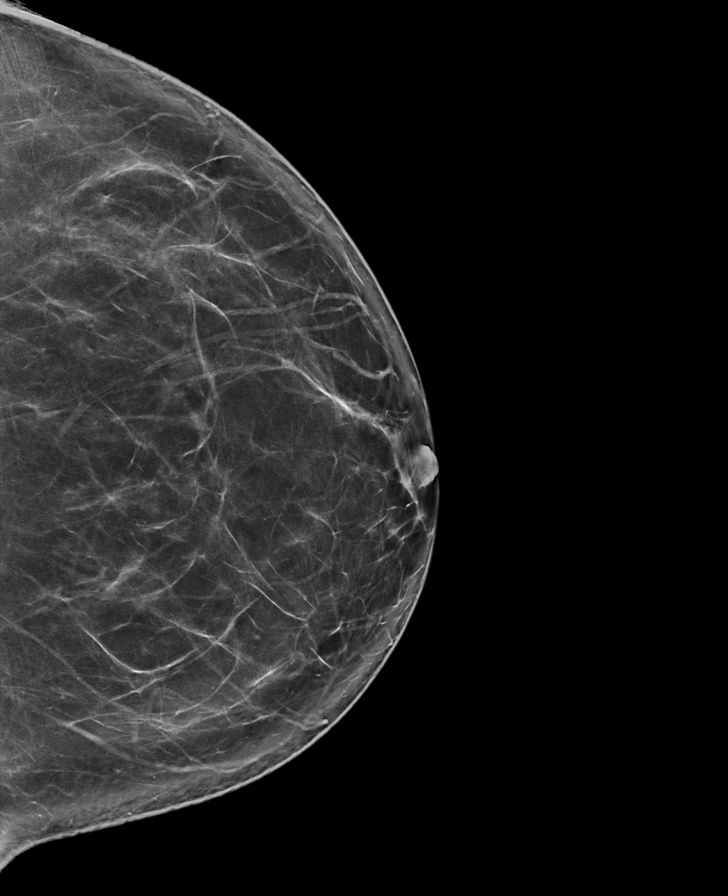

[L MLO synth-2D]
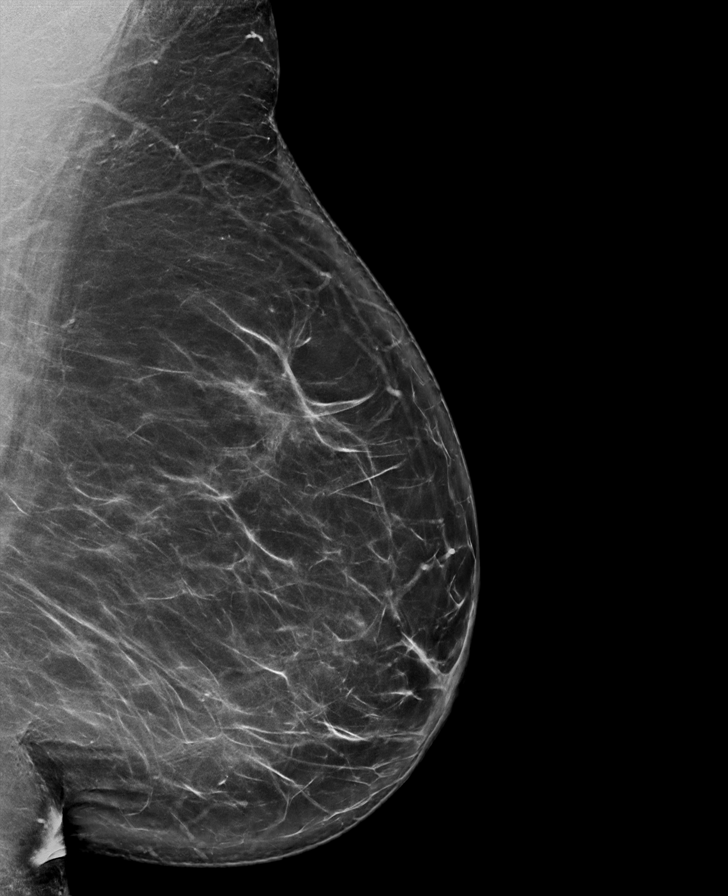

[R MLO synth-2D]
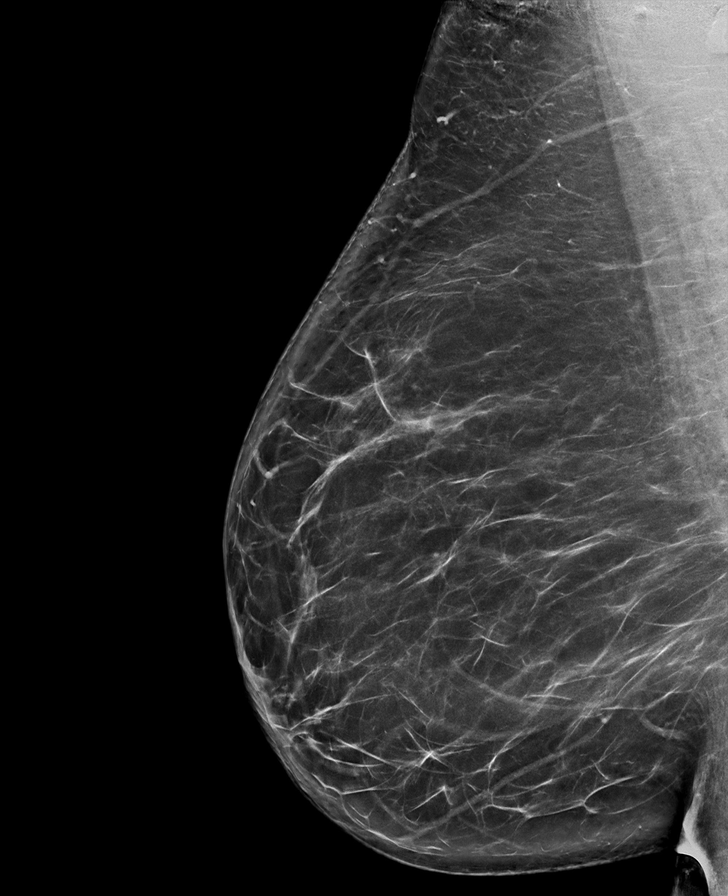

[R CC tomo · tomo slice 46/91.0]
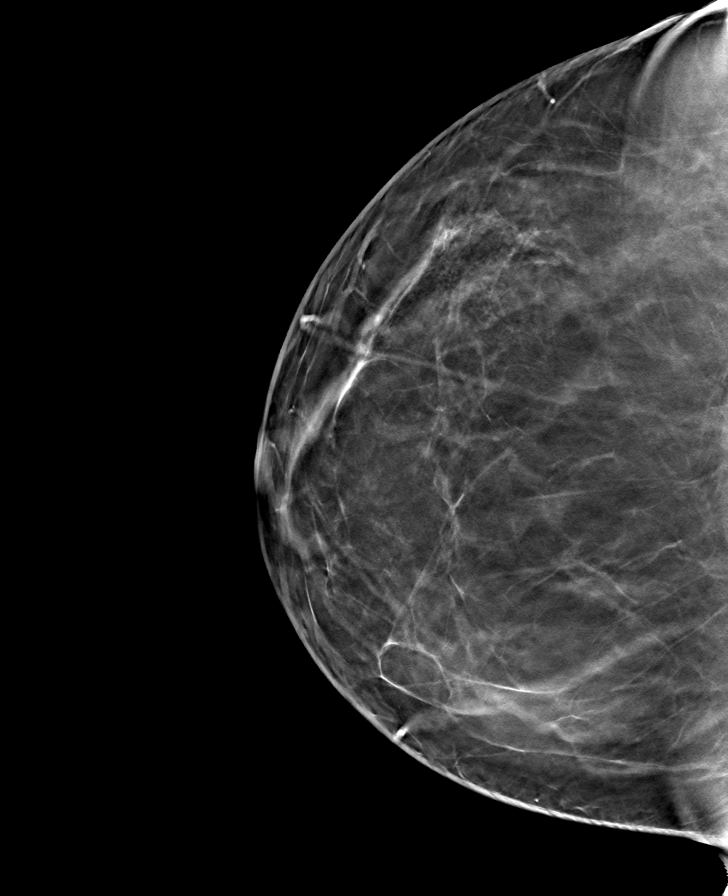

[L MLO tomo · tomo slice 49/98.0]
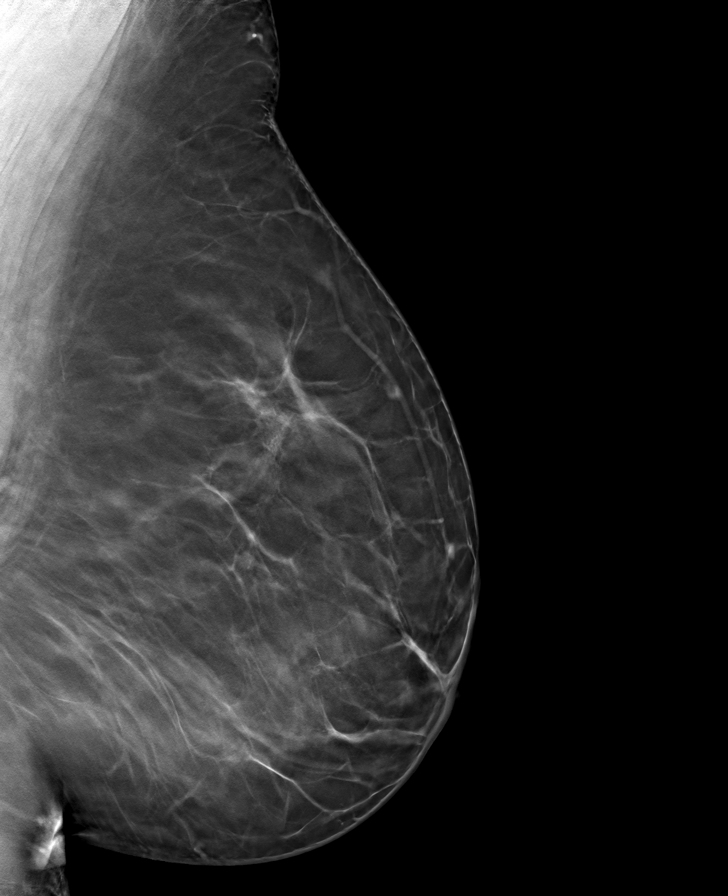

[L CC tomo · tomo slice 46/91.0]
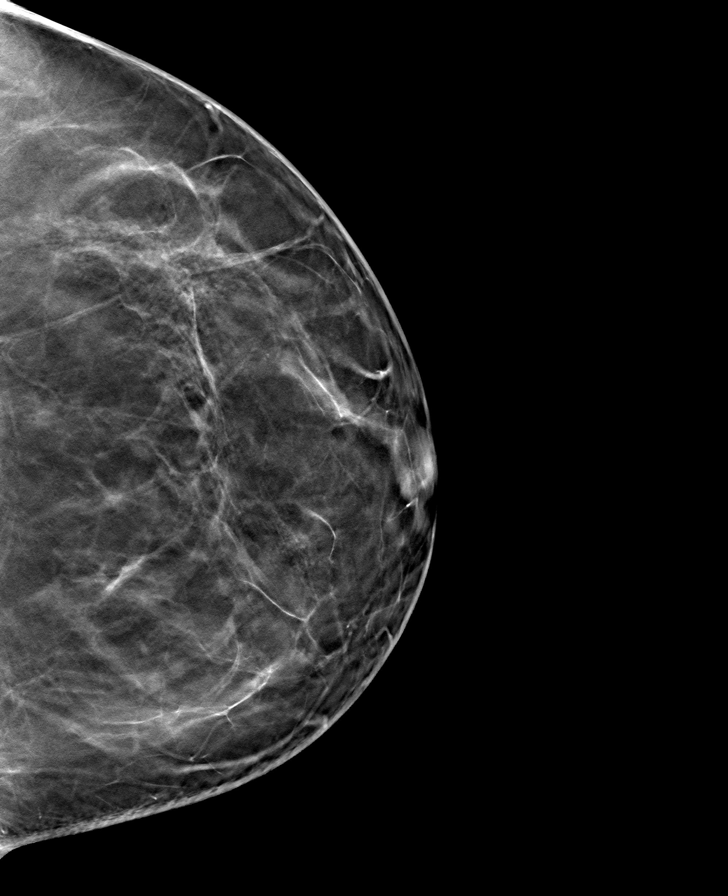

[R MLO tomo · tomo slice 49/97.0]
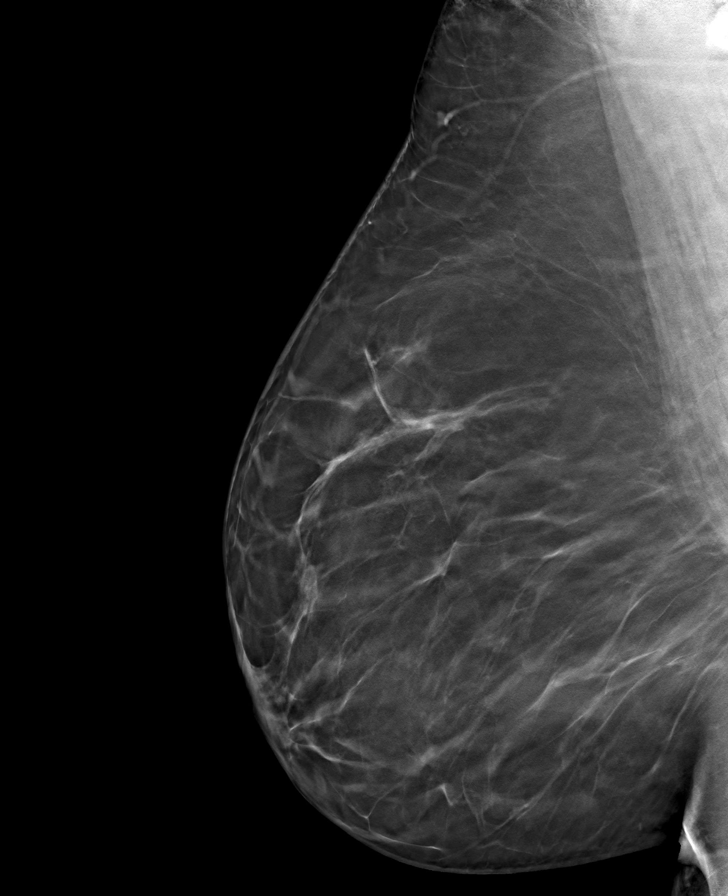

[8 of 24 positions shown; findings below may reference images not displayed]

ACR Breast Density Category b: There are scattered areas of
fibroglandular density.
FINDINGS: There are no findings suspicious for malignancy.
IMPRESSION: No mammographic evidence of malignancy. A result letter of this
screening mammogram will be mailed directly to the patient.

RECOMMENDATION:
Screening mammogram in one year. (Code:XG-X-X7B)

BI-RADS CATEGORY  1: Negative.

## 2023-12-07 ENCOUNTER — Encounter: Payer: Self-pay | Admitting: Obstetrics & Gynecology

## 2023-12-07 ENCOUNTER — Ambulatory Visit (INDEPENDENT_AMBULATORY_CARE_PROVIDER_SITE_OTHER): Admitting: Obstetrics & Gynecology

## 2023-12-07 VITALS — BP 128/84 | HR 62 | Ht 72.0 in | Wt 278.0 lb

## 2023-12-07 DIAGNOSIS — N83202 Unspecified ovarian cyst, left side: Secondary | ICD-10-CM | POA: Diagnosis not present

## 2023-12-07 NOTE — Progress Notes (Addendum)
 53 y.o. New GYN presents for PMB.  Pt had Hysterectomy in 2011 and started spotting last Month, but it stopped 2 weeks ago.  Last Mammogram 07/09/2023 Last Colonoscopy 08/05/2023  PHQ-9=17  //GAD-7=18. rEFERRAL DONE
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
## 2023-12-08 LAB — FOLLICLE STIMULATING HORMONE: FSH: 15.4 m[IU]/mL

## 2023-12-08 LAB — CA 125: Cancer Antigen (CA) 125: 3.2 U/mL (ref 0.0–38.1)

## 2024-01-04 ENCOUNTER — Ambulatory Visit: Admitting: Licensed Clinical Social Worker

## 2024-01-04 DIAGNOSIS — F4323 Adjustment disorder with mixed anxiety and depressed mood: Secondary | ICD-10-CM | POA: Diagnosis not present

## 2024-01-04 NOTE — BH Specialist Note (Signed)
 Integrated Behavioral Health via Telemedicine Visit  01/09/2024 [name] [id number]  Number of Integrated Behavioral Health Clinician visits: 1- Initial Visit  Session Start time: 1545   Session End time: 1615  Total time in minutes: 30    Referring Provider:  Patient/Family location: [facility] Provider location: Remote Office All persons participating in visit: Patient and [facility] Types of Service: Individual psychotherapy and Video visit  I connected with Harrie Lesches and/or Harrie Deiters patient via  Telephone or Engineer, Civil (consulting)  (Video is Surveyor, mining) and verified that I am speaking with the correct person using two identifiers. Discussed confidentiality: Yes   I discussed the limitations of telemedicine and the availability of in person appointments.  Discussed there is a possibility of technology failure and discussed alternative modes of communication if that failure occurs.  I discussed that engaging in this telemedicine visit, they consent to the provision of behavioral healthcare and the services will be billed under their insurance.  Patient and/or legal guardian expressed understanding and consented to Telemedicine visit: Yes   Presenting Concerns: Patient and/or family reports the following symptoms/concerns: increased depressive symptoms stemming from health concerns.   Duration of problem: Months; Severity of problem: moderate  Patient and/or Family's Strengths/Protective Factors: Social and Emotional competence, Concrete supports in place (healthy food, safe environments, etc.), Physical Health (exercise, healthy diet, medication compliance, etc.), and Caregiver has knowledge of parenting & child development  Goals Addressed: Patient will:  Reduce symptoms of: anxiety and depression   Increase knowledge and/or ability of: coping skills, healthy habits, and self-management skills   Demonstrate ability to: Increase healthy  adjustment to current life circumstances and Increase adequate support systems for patient/family  Progress towards Goals: Ongoing    Interventions: Interventions utilized:  Mindfulness or Management Consultant, Supportive Counseling, Psychoeducation and/or Health Education, Communication Skills, and Supportive Reflection Standardized Assessments completed: Not Needed    Patient and/or Family Response: Patient was present for today's virtual session and reported increased depressive symptoms and ongoing physical health concerns over the past three years. She shared that during this time, she lost her twin sister and her brother, noting that she is now the oldest living woman on her mother's side of the family. Patient described experiencing periods of low mood, worry, and difficulty maintaining motivation and follow-through on daily tasks, alternating with days when she feels more capable and stable. She reported chronic pain in her left leg over the past month and ongoing medical concerns, including Graves' disease and thyroid  issues, which contribute to her emotional distress. Patient expressed intent to follow up with her primary care provider regarding her physical health symptoms/current pain in leg..  Clinical Assessment/Diagnosis  Adjustment disorder with mixed anxiety and depressed mood    Assessment: Patient currently experiencing fluctuating depressive symptoms, worry, and low motivation associated with grief, chronic health conditions, and physical pain..   Patient may benefit from continued support of integrated behavioral health services.  Plan: Follow up with behavioral health clinician on : 01/12/2024 Behavioral recommendations: Recommend continued therapy to support grief processing, mood regulation, and coping with chronic illness. Encourage patient to maintain medical follow-up with her PCP and explore integrated behavioral health support for managing depressive and anxiety  symptoms.  Referral(s): Integrated [employer] (In Clinic)  I discussed the assessment and treatment plan with the patient and/or parent/guardian. They were provided an opportunity to ask questions and all were answered. They agreed with the plan and demonstrated an understanding of the instructions.  They were advised to call back or seek an in-person evaluation if the symptoms worsen or if the condition fails to improve as anticipated.  Halford Goetzke LITTIE Seats, LCSWA

## 2024-01-05 ENCOUNTER — Encounter (HOSPITAL_COMMUNITY): Payer: Self-pay

## 2024-01-05 ENCOUNTER — Emergency Department (HOSPITAL_COMMUNITY)
Admission: EM | Admit: 2024-01-05 | Discharge: 2024-01-05 | Disposition: A | Discharge status: Home / Self Care | Attending: Emergency Medicine | Admitting: Emergency Medicine

## 2024-01-05 ENCOUNTER — Emergency Department (HOSPITAL_COMMUNITY)

## 2024-01-05 DIAGNOSIS — R0602 Shortness of breath: Secondary | ICD-10-CM | POA: Insufficient documentation

## 2024-01-05 DIAGNOSIS — R0789 Other chest pain: Secondary | ICD-10-CM | POA: Insufficient documentation

## 2024-01-05 DIAGNOSIS — R059 Cough, unspecified: Secondary | ICD-10-CM | POA: Diagnosis present

## 2024-01-05 DIAGNOSIS — J069 Acute upper respiratory infection, unspecified: Secondary | ICD-10-CM | POA: Insufficient documentation

## 2024-01-05 LAB — CBC
HCT: 37.2 % (ref 36.0–46.0)
Hemoglobin: 11.9 g/dL — ABNORMAL LOW (ref 12.0–15.0)
MCH: 28 pg (ref 26.0–34.0)
MCHC: 32 g/dL (ref 30.0–36.0)
MCV: 87.5 fL (ref 80.0–100.0)
Platelets: 314 K/uL (ref 150–400)
RBC: 4.25 MIL/uL (ref 3.87–5.11)
RDW: 13.1 % (ref 11.5–15.5)
WBC: 6.4 K/uL (ref 4.0–10.5)
nRBC: 0 % (ref 0.0–0.2)

## 2024-01-05 LAB — TROPONIN I (HIGH SENSITIVITY): Troponin I (High Sensitivity): 3 ng/L (ref <18)

## 2024-01-05 LAB — I-STAT VENOUS BLOOD GAS, ED
Acid-base deficit: 1 mmol/L (ref 0.0–2.0)
Bicarbonate: 21.2 mmol/L (ref 20.0–28.0)
Calcium, Ion: 1.15 mmol/L (ref 1.15–1.40)
HCT: 37 % (ref 36.0–46.0)
Hemoglobin: 12.6 g/dL (ref 12.0–15.0)
O2 Saturation: 30 %
Potassium: 3.2 mmol/L — ABNORMAL LOW (ref 3.5–5.1)
Sodium: 139 mmol/L (ref 135–145)
TCO2: 22 mmol/L (ref 22–32)
pCO2, Ven: 28.6 mmHg — ABNORMAL LOW (ref 44–60)
pH, Ven: 7.479 — ABNORMAL HIGH (ref 7.25–7.43)
pO2, Ven: 17 mmHg — CL (ref 32–45)

## 2024-01-05 LAB — RESP PANEL BY RT-PCR (RSV, FLU A&B, COVID)  RVPGX2
Influenza A by PCR: NEGATIVE
Influenza B by PCR: NEGATIVE
Resp Syncytial Virus by PCR: NEGATIVE
SARS Coronavirus 2 by RT PCR: NEGATIVE

## 2024-01-05 LAB — BASIC METABOLIC PANEL WITH GFR
Anion gap: 9 (ref 5–15)
BUN: 11 mg/dL (ref 6–20)
CO2: 21 mmol/L — ABNORMAL LOW (ref 22–32)
Calcium: 8.9 mg/dL (ref 8.9–10.3)
Chloride: 104 mmol/L (ref 98–111)
Creatinine, Ser: 0.88 mg/dL (ref 0.44–1.00)
GFR, Estimated: >60 mL/min (ref >60)
Glucose, Bld: 87 mg/dL (ref 70–99)
Potassium: 3.1 mmol/L — ABNORMAL LOW (ref 3.5–5.1)
Sodium: 134 mmol/L — ABNORMAL LOW (ref 135–145)

## 2024-01-05 LAB — D-DIMER, QUANTITATIVE: D-Dimer, Quant: <0.27 ug{FEU}/mL (ref 0.00–0.50)

## 2024-01-05 MED ORDER — POTASSIUM CHLORIDE CRYS ER 20 MEQ PO TBCR
40.0000 meq | EXTENDED_RELEASE_TABLET | Freq: Once | ORAL | Status: AC
  Administered 2024-01-05: 40 meq via ORAL
  Filled 2024-01-05: qty 2

## 2024-01-05 MED ORDER — ASPIRIN 81 MG PO CHEW
324.0000 mg | CHEWABLE_TABLET | Freq: Once | ORAL | Status: AC
  Administered 2024-01-05: 324 mg via ORAL
  Filled 2024-01-05: qty 4

## 2024-01-05 MED ORDER — ACETAMINOPHEN 500 MG PO TABS
1000.0000 mg | ORAL_TABLET | Freq: Once | ORAL | Status: AC
  Administered 2024-01-05: 1000 mg via ORAL
  Filled 2024-01-05: qty 2

## 2024-01-05 NOTE — ED Provider Triage Note (Signed)
 Emergency Medicine Provider Triage Evaluation Note  [name] , a 53 y.o. female  was evaluated in triage.  Pt complains of chest pain, shortness of breath that started yesterday and got worse today.  History of heart attack but no PCI.  States this feels somewhat similar but not entirely.  Aspirin ordered.  Review of Systems  Positive: As above Negative: As above  Physical Exam  BP (!) 137/98 (BP Location: Left Arm)   Pulse 68   Temp 97.6 F (36.4 C) (Oral)   Resp (!) 27   SpO2 100%  Gen:   Awake, no distress   Resp:  Normal effort  MSK:   Moves extremities without difficulty  Other:    Medical Decision Making  Medically screening exam initiated at 2:06 PM.  Appropriate orders placed.  [name] was informed that the remainder of the evaluation will be completed by another provider, this initial triage assessment does not replace that evaluation, and the importance of remaining in the ED until their evaluation is complete.     Hildegard Loge, PA-C 01/05/24 1408

## 2024-01-05 NOTE — ED Provider Notes (Signed)
 [place] EMERGENCY DEPARTMENT AT [facility] Provider Note   CSN: [id number] Arrival date & time: 01/05/24  1350     Patient presents with: Chest Pain and Shortness of Breath   [name] is a 53 y.o. female.   HPI 53 year old female presents with acute chest pain and shortness of breath.  She developed the symptoms yesterday.  She has also had a nonproductive cough and this morning felt like she had a subjective she fever.  Pain is a tightness in her chest as well as a pressure.  Seem to start in the inferior mid of her chest and now is more mid anterior.  The pain worsens with deep breaths.  No leg swelling but her left leg has felt abnormal with some intermittent pain near her ankle for the last 1 month or so.  No calf pain.  No sore throat but has had some rhinorrhea.  The pain has been constant.  Prior to Admission medications   Medication Sig Start Date End Date Taking? Authorizing Provider  acetaminophen  (TYLENOL ) 500 MG tablet Take 1 tablet (500 mg total) by mouth every 6 (six) hours as needed. 09/14/16   Law, Alexandra M, PA-C  albuterol  (VENTOLIN  HFA) 108 (90 Base) MCG/ACT inhaler Inhale 2 puffs into the lungs. 05/21/23   [provider]  buPROPion (WELLBUTRIN XL) 150 MG 24 hr tablet TAKE 1 TABLET BY MOUTH ONCE DAILY, INCREASE TO 2 TABLETS (300 MG) AFTER 7 DAYS 04/03/21   [provider]  cetirizine (ZYRTEC) 10 MG tablet Take 1 tablet by mouth daily. 06/24/23   [provider]  cyclobenzaprine  (FLEXERIL ) 10 MG tablet Take 1 tablet (10 mg total) by mouth 2 (two) times daily as needed for muscle spasms. 06/12/20   Corlis Burnard DEL, NP  fluticasone (FLONASE) 50 MCG/ACT nasal spray Place 2 sprays into the nose. 05/20/23   [provider]  hydrochlorothiazide (HYDRODIURIL) 25 MG tablet Take 1 tablet by mouth daily. 05/20/23   [provider]  hydrOXYzine (ATARAX) 25 MG tablet Take 25 mg by mouth. 04/03/21   [provider]  ibuprofen   (ADVIL ) 800 MG tablet Take 1 tablet (800 mg total) by mouth every 8 (eight) hours as needed. 06/12/20   Corlis Burnard DEL, NP  methocarbamol  (ROBAXIN ) 500 MG tablet Take 1 tablet (500 mg total) by mouth every 6 (six) hours as needed for muscle spasms. Patient not taking: Reported on 08/05/2023 02/28/23   Haze Raisin, MD  polyethylene glycol (MIRALAX / GLYCOLAX) 17 g packet use as directed by oral route daily 04/30/21   [provider]  Vitamin D, Ergocalciferol, (DRISDOL) 1.25 MG (50000 UNIT) CAPS capsule Take 50,000 Units by mouth once a week.    [provider]    Allergies: Medroxyprogesterone acetate, Medroxyprogesterone acetate, Menthol, Valproic acid, Porcine (pork) protein-containing drug products, and Penicillins    Review of Systems  Constitutional:  Positive for fever (subjective).  HENT:  Negative for sore throat.   Respiratory:  Positive for cough, chest tightness and shortness of breath.   Cardiovascular:  Positive for chest pain. Negative for leg swelling.  Gastrointestinal:  Negative for abdominal pain.    Updated Vital Signs BP (!) 139/92   Pulse (!) 104   Temp 97.7 F (36.5 C)   Resp (!) 21   Ht 6' (1.829 m)   Wt 119.7 kg   SpO2 100%   BMI 35.80 kg/m   Physical Exam Vitals and nursing note reviewed. Exam conducted with a chaperone  present.  Constitutional:      General: She is not in acute distress.    Appearance: She is well-developed. She is not ill-appearing or diaphoretic.  HENT:     Head: Normocephalic and atraumatic.  Cardiovascular:     Rate and Rhythm: Normal rate and regular rhythm.     Heart sounds: Normal heart sounds.  Pulmonary:     Effort: Pulmonary effort is normal.     Breath sounds: Normal breath sounds. No wheezing or rales.  Chest:     Chest wall: Tenderness present.    Abdominal:     Palpations: Abdomen is soft.     Tenderness: There is no abdominal tenderness.  Musculoskeletal:     Right lower leg: No tenderness. No  edema.     Left lower leg: No tenderness. No edema.  Skin:    General: Skin is warm and dry.  Neurological:     Mental Status: She is alert.     (all labs ordered are listed, but only abnormal results are displayed) Labs Reviewed  BASIC METABOLIC PANEL WITH GFR - Abnormal; Notable for the following components:      Result Value   Sodium 134 (*)    Potassium 3.1 (*)    CO2 21 (*)    All other components within normal limits  CBC - Abnormal; Notable for the following components:   Hemoglobin 11.9 (*)    All other components within normal limits  I-STAT VENOUS BLOOD GAS, ED - Abnormal; Notable for the following components:   pH, Ven 7.479 (*)    pCO2, Ven 28.6 (*)    pO2, Ven 17 (*)    Potassium 3.2 (*)    All other components within normal limits  RESP PANEL BY RT-PCR (RSV, FLU A&B, COVID)  RVPGX2  D-DIMER, QUANTITATIVE  TROPONIN I (HIGH SENSITIVITY)    EKG: EKG Interpretation Date/Time:  Wednesday January 05 2024 15:15:34 EDT Ventricular Rate:  57 PR Interval:  148 QRS Duration:  109 QT Interval:  432 QTC Calculation: 421 R Axis:   0  Text Interpretation: Sinus rhythm Abnormal R-wave progression, early transition no significant change since Feb 2025 Confirmed by Freddi Hamilton [phone]) on 01/05/2024 3:20:38 PM  Radiology: DG Chest 2 View Result Date: 01/05/2024 EXAM: 2 VIEW(S) XRAY OF THE CHEST 01/05/2024 02:36:25 PM COMPARISON: 05/03/2023 CLINICAL HISTORY: chest pain. Reason for Exam: chest pain; Patient complains of chest pain, sob, and left foot numbness; Triage Note: Pt having chest pain and shortness of breath starting yesterday, pt hyperventilating and unable to complete sentences at this time. Pt has cardiac hx and feels same as heart attack she has had before. FINDINGS: LUNGS AND PLEURA: No focal pulmonary opacity. No pulmonary edema. No pleural effusion. No pneumothorax. HEART AND MEDIASTINUM: No acute abnormality of the cardiac and mediastinal silhouettes. BONES  AND SOFT TISSUES: No acute osseous abnormality. IMPRESSION: 1. No acute cardiopulmonary process. Electronically signed by: Waddell Calk MD 01/05/2024 02:58 PM EDT RP Workstation: [id number]     Procedures   Medications Ordered in the ED  aspirin chewable tablet 324 mg (324 mg Oral Given 01/05/24 1414)  acetaminophen  (TYLENOL ) tablet 1,000 mg (1,000 mg Oral Given 01/05/24 1525)  potassium chloride SA (KLOR-CON M) CR tablet 40 mEq (40 mEq Oral Given 01/05/24 1525)                                    Medical  Decision Making Amount and/or Complexity of Data Reviewed Labs: ordered.    Details: Normal troponin, normal D-dimer Radiology: ordered and independent interpretation performed.    Details: No pneumonia ECG/medicine tests: ordered and independent interpretation performed.    Details: No ischemia  Risk OTC drugs. Prescription drug management.   Patient presents with what is most likely chest wall pain in the setting of a URI.  X-ray shows no evidence of pneumonia.  D-dimer was send given the pleuritic pain but I suspect this is more pleurisy.  Troponin is negative after over a day of symptoms continuously, doubt ACS.  She does report a prior history of MI but this was 15+ years ago.  No indication of MI today.  Low suspicion in this presentation.  Otherwise, feels better with some Tylenol .  Will discharge home with return precautions.  Of note, her vitals have been reassuring though the discharge heart rate is 104, which is a new finding.  She was clinically otherwise appearing well and I doubt this is clinically significant at this time.     Final diagnoses:  Chest wall pain  Upper respiratory tract infection, unspecified type    ED Discharge Orders     None          Freddi Hamilton, MD 01/05/24 2230

## 2024-01-05 NOTE — Discharge Instructions (Signed)
 If you develop recurrent, continued, or worsening chest pain, shortness of breath, fever, vomiting, abdominal or back pain, or any other new/concerning symptoms then return to the ER for evaluation.

## 2024-01-05 NOTE — ED Notes (Signed)
 Extra DG, Blue & Red top drawn

## 2024-01-05 NOTE — ED Triage Notes (Addendum)
 Pt having chest pain and shortness of breath starting yesterday, pt hyperventilating and unable to complete sentences at this time. Pt has cardiac hx and feels same as heart attack she has had before.

## 2024-01-12 ENCOUNTER — Encounter: Payer: Self-pay | Admitting: Sports Medicine

## 2024-01-12 ENCOUNTER — Other Ambulatory Visit (INDEPENDENT_AMBULATORY_CARE_PROVIDER_SITE_OTHER): Payer: Self-pay

## 2024-01-12 ENCOUNTER — Other Ambulatory Visit: Payer: Self-pay

## 2024-01-12 ENCOUNTER — Ambulatory Visit: Admitting: Sports Medicine

## 2024-01-12 ENCOUNTER — Ambulatory Visit: Admitting: Licensed Clinical Social Worker

## 2024-01-12 DIAGNOSIS — M7732 Calcaneal spur, left foot: Secondary | ICD-10-CM

## 2024-01-12 DIAGNOSIS — M7662 Achilles tendinitis, left leg: Secondary | ICD-10-CM

## 2024-01-12 DIAGNOSIS — M7752 Other enthesopathy of left foot: Secondary | ICD-10-CM | POA: Diagnosis not present

## 2024-01-12 DIAGNOSIS — M79672 Pain in left foot: Secondary | ICD-10-CM | POA: Diagnosis not present

## 2024-01-12 DIAGNOSIS — M79671 Pain in right foot: Secondary | ICD-10-CM

## 2024-01-12 DIAGNOSIS — F4323 Adjustment disorder with mixed anxiety and depressed mood: Secondary | ICD-10-CM

## 2024-01-12 MED ORDER — METHYLPREDNISOLONE 4 MG PO TBPK: ORAL_TABLET | ORAL | 0 refills | Status: AC
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
## 2024-01-12 NOTE — Progress Notes (Signed)
 Patient says that she began having pain in the left foot about 2 months ago. She says that she feels it in the bottom of the heel and up the back of the heel into the achilles. She has had some pain in the right foot as well lately. Patient has tried various home treatments including ice, heat, topical treatments, and stretches at home. She does have worse pain after sleeping or sitting for prolonged periods of time. She says she is on her feet for work and takes the bus as transportation, so by the time she gets home after work she is struggling to walk.

## 2024-01-12 NOTE — BH Specialist Note (Signed)
 Integrated Behavioral Health via Telemedicine Visit  01/17/2024 Yoshika Vensel [id number]  Number of Integrated Behavioral Health Clinician visits: 2- Second Visit  Session Start time: 1120   Session End time: 1153  Total time in minutes: 33    Referring Provider:  Patient/Family location: Home [facility] Provider location: Remote Office All persons participating in visit: Patient and [facility] Types of Service: Individual psychotherapy and Video visit  I connected with Harrie Lesches and/or Harrie Deiters patient via  Telephone or Engineer, Civil (consulting)  (Video is Surveyor, mining) and verified that I am speaking with the correct person using two identifiers. Discussed confidentiality: Yes   I discussed the limitations of telemedicine and the availability of in person appointments.  Discussed there is a possibility of technology failure and discussed alternative modes of communication if that failure occurs.  I discussed that engaging in this telemedicine visit, they consent to the provision of behavioral healthcare and the services will be billed under their insurance.  Patient and/or legal guardian expressed understanding and consented to Telemedicine visit: Yes   Presenting Concerns: Patient and/or family reports the following symptoms/concerns: increased depression and anxiety symptoms.  Duration of problem: Months; Severity of problem: moderate  Patient and/or Family's Strengths/Protective Factors: Social and Emotional competence, Concrete supports in place (healthy food, safe environments, etc.), and Physical Health (exercise, healthy diet, medication compliance, etc.)  Goals Addressed: Patient will:  Reduce symptoms of: anxiety and depression   Increase knowledge and/or ability of: coping skills, healthy habits, and self-management skills   Demonstrate ability to: Increase healthy adjustment to current life circumstances and Increase adequate support systems for  patient/family  Progress towards Goals: Ongoing    Interventions: Interventions utilized:  Solution-Focused Strategies, Supportive Counseling, Psychoeducation and/or Health Education, Communication Skills, and Supportive Reflection Standardized Assessments completed: Not Needed    Patient and/or Family Response: Patient was present for today's virtual session and reported feeling overwhelmed due to ongoing work-related stress. She processed conflict with a coworker and expressed feeling unsupported by her manager, which has contributed to increased symptoms of depression and anxiety. Patient shared that her job stress has begun to impact her physical health, noting a recent hospital visit for chest pain, and requested to be written out of work. She was receptive to discussing potential next steps, including contacting HR to file a complaint. Patient reported a history of depression, anxiety, and bipolar disorder and acknowledged that she has been off her medications. She expressed understanding of the importance of medication adherence in managing her mental health symptoms.   Clinical Assessment/Diagnosis  Adjustment disorder with mixed anxiety and depressed mood    Assessment: Patient currently experiencing heightened work-related stress, feelings of being unsupported by her manager, and increased symptoms of depression and anxiety. She also reports physical symptoms related to stress and acknowledges challenges with medication adherence..   Patient may benefit from continued support of integrated behavioral health services.  Plan: Follow up with behavioral health clinician on : 01/26/2024 Behavioral recommendations: patient to re-engage with her prescribed medication regimen and maintain regular follow-up with her mental health provider. Additionally, she should continue utilizing coping strategies to manage stress, consider reaching out to HR for workplace support, and prioritize  self-care and rest to support both her mental and physical well-being. Referral(s): Integrated [employer] (In Clinic)  I discussed the assessment and treatment plan with the patient and/or parent/guardian. They were provided an opportunity to ask questions and all were answered. They agreed with the plan and demonstrated an  understanding of the instructions.   They were advised to call back or seek an in-person evaluation if the symptoms worsen or if the condition fails to improve as anticipated.  Carmelina Balducci LITTIE Seats, LCSWA

## 2024-01-15 ENCOUNTER — Ambulatory Visit (HOSPITAL_COMMUNITY)
Admission: EM | Admit: 2024-01-15 | Discharge: 2024-01-15 | Disposition: A | Discharge status: Home / Self Care | Attending: Family Medicine | Admitting: Family Medicine

## 2024-01-15 ENCOUNTER — Encounter (HOSPITAL_COMMUNITY): Payer: Self-pay

## 2024-01-15 DIAGNOSIS — H5789 Other specified disorders of eye and adnexa: Secondary | ICD-10-CM | POA: Diagnosis not present

## 2024-01-15 MED ORDER — FLUORESCEIN SODIUM 1 MG OP STRP
ORAL_STRIP | OPHTHALMIC | Status: AC
  Filled 2024-01-15: qty 1

## 2024-01-15 MED ORDER — ERYTHROMYCIN 5 MG/GM OP OINT: TOPICAL_OINTMENT | OPHTHALMIC | 0 refills | Status: AC

## 2024-01-15 MED ORDER — OLOPATADINE HCL 0.2 % OP SOLN: 1.0000 [drp] | Freq: Every day | OPHTHALMIC | 0 refills | Status: AC

## 2024-01-15 MED ORDER — TETRACAINE HCL 0.5 % OP SOLN
OPHTHALMIC | Status: AC
  Filled 2024-01-15: qty 4

## 2024-01-15 NOTE — ED Triage Notes (Signed)
 Pt states she used colored contacts yesterday for halloween and fell asleep with them in.  States when she woke up this morning her eyes were irritated and she was having trouble with her vision which she states has resolved some.
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
## 2024-01-17 ENCOUNTER — Encounter: Payer: Self-pay | Admitting: Radiology

## 2024-01-26 ENCOUNTER — Other Ambulatory Visit: Payer: Self-pay

## 2024-01-26 ENCOUNTER — Ambulatory Visit (INDEPENDENT_AMBULATORY_CARE_PROVIDER_SITE_OTHER): Admitting: Sports Medicine

## 2024-01-26 ENCOUNTER — Encounter: Admitting: Licensed Clinical Social Worker

## 2024-01-26 ENCOUNTER — Encounter: Payer: Self-pay | Admitting: Sports Medicine

## 2024-01-26 DIAGNOSIS — M79672 Pain in left foot: Secondary | ICD-10-CM | POA: Diagnosis not present

## 2024-01-26 DIAGNOSIS — M65979 Unspecified synovitis and tenosynovitis, unspecified ankle and foot: Secondary | ICD-10-CM

## 2024-01-26 DIAGNOSIS — M9261 Juvenile osteochondrosis of tarsus, right ankle: Secondary | ICD-10-CM

## 2024-01-26 DIAGNOSIS — M722 Plantar fascial fibromatosis: Secondary | ICD-10-CM | POA: Diagnosis not present

## 2024-01-26 DIAGNOSIS — M79671 Pain in right foot: Secondary | ICD-10-CM | POA: Diagnosis not present

## 2024-01-26 DIAGNOSIS — M9262 Juvenile osteochondrosis of tarsus, left ankle: Secondary | ICD-10-CM

## 2024-01-26 MED ORDER — MELOXICAM 15 MG PO TABS: 15.0000 mg | ORAL_TABLET | Freq: Every day | ORAL | 0 refills | Status: DC

## 2024-01-26 NOTE — Progress Notes (Signed)
 Patient says that her pain had improved, but it is now back to the level of pain that she had previously. She says that at its best, she felt it was a 1 or 2/10 pain. Today, she feels it is a 9/10 pain. She says that her pain had begun to improve prior to starting the steroid, but once she finished the steroid her pain returned. She still wears the heel lift, soaks her feet, and does gentle stretching.
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
## 2024-01-27 ENCOUNTER — Ambulatory Visit (INDEPENDENT_AMBULATORY_CARE_PROVIDER_SITE_OTHER): Admitting: Licensed Clinical Social Worker

## 2024-01-27 DIAGNOSIS — F4323 Adjustment disorder with mixed anxiety and depressed mood: Secondary | ICD-10-CM | POA: Diagnosis not present

## 2024-01-27 NOTE — BH Specialist Note (Signed)
 Integrated Behavioral Health via Telemedicine Visit  02/02/2024 [name] [id number]  Number of Integrated Behavioral Health Clinician visits: 3- Third Visit  Session Start time: 1450   Session End time: 1515  Total time in minutes: 25    Referring Provider:  Patient/Family location: At work [facility] Provider location: Research Scientist (medical) All persons participating in visit: Patient and [facility] Types of Service: Individual psychotherapy and Video visit  I connected with Harrie Lesches and/or Harrie Deiters patient via  Telephone or Engineer, Civil (consulting)  (Video is Surveyor, mining) and verified that I am speaking with the correct person using two identifiers. Discussed confidentiality: Yes   I discussed the limitations of telemedicine and the availability of in person appointments.  Discussed there is a possibility of technology failure and discussed alternative modes of communication if that failure occurs.  I discussed that engaging in this telemedicine visit, they consent to the provision of behavioral healthcare and the services will be billed under their insurance.  Patient and/or legal guardian expressed understanding and consented to Telemedicine visit: Yes   Presenting Concerns: Patient and/or family reports the following symptoms/concerns: decreased depression and anxiety symptoms.  Duration of problem: Months; Severity of problem: moderate  Patient and/or Family's Strengths/Protective Factors: Social and Emotional competence, Concrete supports in place (healthy food, safe environments, etc.), and Physical Health (exercise, healthy diet, medication compliance, etc.)  Goals Addressed: Patient will:  Reduce symptoms of: anxiety and depression   Increase knowledge and/or ability of: coping skills, healthy habits, and self-management skills   Demonstrate ability to: Increase healthy adjustment to current life circumstances and Increase adequate support systems for  patient/family  Progress towards Goals: Ongoing    Interventions: Interventions utilized:  Mindfulness or Management Consultant, Supportive Counseling, Psychoeducation and/or Health Education, Communication Skills, and Supportive Reflection Standardized Assessments completed: Not Needed    Patient and/or Family Response: Patient was present for today's virtual session. She worked to report that her anxiety symptoms has calmed down significantly and she's preparing more for the holidays. She mentioned feeling less stress and denied being in pain. Patient reports work has been going well. She has been keeping her distance from her coworkers and her manager has agreed to move her shifts around. Patient reports being in a much better space as she is also now back on her medications. She reports her kitens are also a big stress relief and keeps her calm most days. Patient reports she has to get her thyroid  checked in December and was told that she may need surgrey due to the size of her thyriod. Currently unsure if it's cancer but will plan to get a biopsy done to rule this out. This is another reason why she is remaining calm and staying stress free as she understands that stress can impact physical health symptoms. She also has a boot on her foot for about two weeks and has a follow up about this. She has a bone spur and tendenitis in her foot.    Clinical Assessment/Diagnosis  Adjustment disorder with mixed anxiety and depressed mood    Assessment: Patient currently experiencing reduced anxiety, improved mood stability and increased emotional regulation since resuming medications and establishing healthier boundaries at work. Patient is also managing medical concerns related to her thyroid  and foot injury while maintaining efforts to stay calm and stress free.   Patient may benefit from continued support of integrated behavioral health services.  Plan: Follow up with behavioral health  clinician on : Patient will follow up to  schedule appointment.  Behavioral recommendations: Patient to continue anxiety management strategies, including medication adherence, maintaining boundaries and engaging in soothing activities such as spending time with your pets.  Referral(s): Integrated [employer] (In Clinic)  I discussed the assessment and treatment plan with the patient and/or parent/guardian. They were provided an opportunity to ask questions and all were answered. They agreed with the plan and demonstrated an understanding of the instructions.   They were advised to call back or seek an in-person evaluation if the symptoms worsen or if the condition fails to improve as anticipated.  Kendricks Reap LITTIE Seats, LCSWA

## 2024-02-09 ENCOUNTER — Encounter: Payer: Self-pay | Admitting: Sports Medicine

## 2024-02-09 ENCOUNTER — Ambulatory Visit: Admitting: Sports Medicine

## 2024-02-09 DIAGNOSIS — M7672 Peroneal tendinitis, left leg: Secondary | ICD-10-CM | POA: Diagnosis not present

## 2024-02-09 DIAGNOSIS — M2141 Flat foot [pes planus] (acquired), right foot: Secondary | ICD-10-CM

## 2024-02-09 DIAGNOSIS — M79671 Pain in right foot: Secondary | ICD-10-CM

## 2024-02-09 DIAGNOSIS — M722 Plantar fascial fibromatosis: Secondary | ICD-10-CM

## 2024-02-09 DIAGNOSIS — M79672 Pain in left foot: Secondary | ICD-10-CM

## 2024-02-09 DIAGNOSIS — M2142 Flat foot [pes planus] (acquired), left foot: Secondary | ICD-10-CM
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
## 2024-02-09 NOTE — Progress Notes (Signed)
 Patient says that her ankle is feeling much better since being in the boot, but the bottom of her foot has not improved any. She says that the bottom of her right foot is beginning to feel similar to the left. She is still taking her Meloxicam .

## 2024-02-21 ENCOUNTER — Other Ambulatory Visit: Payer: Self-pay

## 2024-02-21 ENCOUNTER — Encounter: Payer: Self-pay | Admitting: Sports Medicine

## 2024-02-21 ENCOUNTER — Other Ambulatory Visit (HOSPITAL_BASED_OUTPATIENT_CLINIC_OR_DEPARTMENT_OTHER): Payer: Self-pay | Admitting: Nurse Practitioner

## 2024-02-21 ENCOUNTER — Ambulatory Visit: Admitting: Sports Medicine

## 2024-02-21 DIAGNOSIS — M7672 Peroneal tendinitis, left leg: Secondary | ICD-10-CM

## 2024-02-21 DIAGNOSIS — M722 Plantar fascial fibromatosis: Secondary | ICD-10-CM

## 2024-02-21 DIAGNOSIS — H4710 Unspecified papilledema: Secondary | ICD-10-CM

## 2024-02-21 DIAGNOSIS — M79671 Pain in right foot: Secondary | ICD-10-CM

## 2024-02-21 MED ORDER — METHYLPREDNISOLONE ACETATE 40 MG/ML IJ SUSP
40.0000 mg | INTRAMUSCULAR | Status: AC | PRN
  Administered 2024-02-21: 40 mg

## 2024-02-21 MED ORDER — BUPIVACAINE HCL 0.25 % IJ SOLN
2.0000 mL | INTRAMUSCULAR | Status: AC | PRN
  Administered 2024-02-21: 2 mL

## 2024-02-21 MED ORDER — LIDOCAINE HCL 1 % IJ SOLN
3.0000 mL | INTRAMUSCULAR | Status: AC | PRN
  Administered 2024-02-21: 3 mL

## 2024-02-21 MED ORDER — MELOXICAM 15 MG PO TABS: 15.0000 mg | ORAL_TABLET | Freq: Every day | ORAL | 0 refills | Status: AC

## 2024-02-21 NOTE — Progress Notes (Signed)
 Patient says that she felt about 5% better for a couple of days after the shockwave therapy. She says that today her pain is flared up more, although she thinks that could be from the weather. She has been rolling her feet on a frozen bottle and soaking in epsom salts. She is here for repeat shockwave treatment today.
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
## 2024-02-28 ENCOUNTER — Ambulatory Visit: Admitting: Licensed Clinical Social Worker

## 2024-02-28 DIAGNOSIS — F4323 Adjustment disorder with mixed anxiety and depressed mood: Secondary | ICD-10-CM | POA: Diagnosis not present
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
## 2024-02-29 ENCOUNTER — Ambulatory Visit (HOSPITAL_BASED_OUTPATIENT_CLINIC_OR_DEPARTMENT_OTHER)

## 2024-03-04 ENCOUNTER — Ambulatory Visit (HOSPITAL_BASED_OUTPATIENT_CLINIC_OR_DEPARTMENT_OTHER)

## 2024-03-14 ENCOUNTER — Ambulatory Visit: Payer: Self-pay | Admitting: Licensed Clinical Social Worker

## 2024-03-14 DIAGNOSIS — F4323 Adjustment disorder with mixed anxiety and depressed mood: Secondary | ICD-10-CM | POA: Diagnosis not present

## 2024-03-15 NOTE — BH Specialist Note (Unsigned)
"     Integrated Behavioral Health via Telemedicine Visit  03/15/2024 Kemyah Buser [id number]  Number of Integrated Behavioral Health Clinician visits: 1- Initial Visit  Session Start time: 1515   Session End time: 1615  Total time in minutes: 60    Referring Provider: *** Patient/Family location: [facility] Provider location: *** All persons participating in visit: *** Types of Service: {CHL AMB TYPE OF SERVICE:[phone]}  I connected with Harrie Lesches and/or Harrie Deiters {family members:20773} via  Telephone or Engineer, Civil (consulting)  (Video is Caregility application) and verified that I am speaking with the correct person using two identifiers. Discussed confidentiality: {YES/NO:21197}  I discussed the limitations of telemedicine and the availability of in person appointments.  Discussed there is a possibility of technology failure and discussed alternative modes of communication if that failure occurs.  I discussed that engaging in this telemedicine visit, they consent to the provision of behavioral healthcare and the services will be billed under their insurance.  Patient and/or legal guardian expressed understanding and consented to Telemedicine visit: {YES/NO:21197}  Presenting Concerns: Patient and/or family reports the following symptoms/concerns: *** Duration of problem: ***; Severity of problem: {Mild/Moderate/Severe:20260}  Patient and/or Family's Strengths/Protective Factors: {CHL AMB BH PROTECTIVE FACTORS:[phone]}  Goals Addressed: Patient will:  Reduce symptoms of: {IBH Symptoms:21014056}   Increase knowledge and/or ability of: {IBH Patient Tools:21014057}   Demonstrate ability to: {IBH Goals:21014053}  Progress towards Goals: {CHL AMB BH PROGRESS TOWARDS [phone]}    Interventions: Interventions utilized:  {IBH Interventions:21014054} Standardized Assessments completed: {IBH Screening Tools:21014051}    Patient and/or Family  Response: ***  Clinical Assessment/Diagnosis  No diagnosis found.    Assessment: Patient currently experiencing ***.   Patient may benefit from ***.  Plan: Follow up with behavioral health clinician on : *** Behavioral recommendations: *** Referral(s): {IBH Referrals:21014055}  I discussed the assessment and treatment plan with the patient and/or parent/guardian. They were provided an opportunity to ask questions and all were answered. They agreed with the plan and demonstrated an understanding of the instructions.   They were advised to call back or seek an in-person evaluation if the symptoms worsen or if the condition fails to improve as anticipated.  Deshannon Seide LITTIE Seats, LCSWA "

## 2024-03-22 ENCOUNTER — Encounter: Payer: Self-pay | Admitting: Licensed Clinical Social Worker

## 2024-03-24 ENCOUNTER — Ambulatory Visit: Payer: Self-pay | Admitting: Licensed Clinical Social Worker

## 2024-04-04 ENCOUNTER — Ambulatory Visit: Payer: Self-pay | Admitting: Licensed Clinical Social Worker

## 2024-04-04 DIAGNOSIS — F4323 Adjustment disorder with mixed anxiety and depressed mood: Secondary | ICD-10-CM | POA: Diagnosis not present

## 2024-04-04 NOTE — BH Specialist Note (Signed)
 "   Integrated Behavioral Health via Telemedicine Visit  04/12/2024 [name] [id number]  Number of Integrated Behavioral Health Clinician visits: 3- Third Visit  Session Start time: 1415   Session End time: 1515  Total time in minutes: 60    Referring Provider:  Patient/Family location: At home [facility] Provider location: Remote Office All persons participating in visit: Patient and [facility] Types of Service: Individual psychotherapy and Video visit  I connected with Harrie Lesches and/or Harrie Deiters patient via  Telephone or Engineer, Civil (consulting)  (Video is Surveyor, mining) and verified that I am speaking with the correct person using two identifiers. Discussed confidentiality: Yes   I discussed the limitations of telemedicine and the availability of in person appointments.  Discussed there is a possibility of technology failure and discussed alternative modes of communication if that failure occurs.  I discussed that engaging in this telemedicine visit, they consent to the provision of behavioral healthcare and the services will be billed under their insurance.  Patient and/or legal guardian expressed understanding and consented to Telemedicine visit: Yes   Presenting Concerns: Patient and/or family reports the following symptoms/concerns: some anxiety symptoms and conflict with children.  Duration of problem: Months; Severity of problem: moderate  Patient and/or Family's Strengths/Protective Factors: Social and Emotional competence, Concrete supports in place (healthy food, safe environments, etc.), Physical Health (exercise, healthy diet, medication compliance, etc.), and Caregiver has knowledge of parenting & child development  Goals Addressed: Patient will:  Reduce symptoms of: anxiety, depression, and mood instability   Increase knowledge and/or ability of: coping skills and healthy habits   Demonstrate ability to: Increase healthy adjustment to current life  circumstances and Increase adequate support systems for patient/family  Progress towards Goals: Ongoing    Interventions: Interventions utilized:  Mindfulness or Management Consultant, Supportive Counseling, Psychoeducation and/or Health Education, Communication Skills, and Supportive Reflection Standardized Assessments completed: Not Needed    Patient and/or Family Response: Patient was present for todays virtual session and reports an upcoming thyroid  surgery scheduled for the 28th, noting mild anxiety related to the procedure and anticipated recovery, including a temporary inability to speak for approximately two weeks. She reports actively preparing for recovery by obtaining soft foods and working to complete home projects, including painting, which she identifies as a source of increased urgency and stress. Patient acknowledges anxiety related to feeling her home is not fully in order and agreed to pace herself, prioritize rest, and prepare for surgery. Patient also reports recent household conflict after her 33 year old son moved in following a relationship breakup; she plans to assist him with securing alternative housing within two weeks to reduce ongoing conflict and emotional distress.   Clinical Assessment/Diagnosis  Adjustment disorder with mixed anxiety and depressed mood    Assessment: Patient currently experiencing mild to moderate anxiety related to her upcoming thyroid  surgery, recovery expectations, and a sense of urgency to complete home tasks. She is also experiencing increased stress due to household conflict with her adult son..   Patient may benefit from continued support of integrated behavioral health services.  Plan: Follow up with behavioral health clinician on : 05/03/2024 Behavioral recommendations: include encouraging the patient to pace herself, prioritize rest, and utilize coping strategies to manage pre-surgical anxiety. She is also encouraged to set  boundaries and follow through with plans to reduce household conflict to prevent escalation of stress and symptoms. Referral(s): Integrated [employer] (In Clinic)  I discussed the assessment and treatment plan with the patient and/or parent/guardian.  They were provided an opportunity to ask questions and all were answered. They agreed with the plan and demonstrated an understanding of the instructions.   They were advised to call back or seek an in-person evaluation if the symptoms worsen or if the condition fails to improve as anticipated.  Keshun Berrett LITTIE Seats, LCSWA "

## 2024-04-12 NOTE — BH Specialist Note (Signed)
 A user error has taken place.

## 2024-04-18 ENCOUNTER — Ambulatory Visit: Payer: Self-pay | Admitting: Obstetrics

## 2024-04-25 ENCOUNTER — Ambulatory Visit: Admitting: Obstetrics

## 2024-05-03 ENCOUNTER — Encounter: Payer: Self-pay | Admitting: Licensed Clinical Social Worker
